# Patient Record
Sex: Male | Born: 1960 | Race: White | Hispanic: No | Marital: Married | Smoking: Never smoker
Health system: Southern US, Community
[De-identification: ages and names within clinical notes are randomized; demographics above are authoritative.]

## PROBLEM LIST (undated history)

## (undated) DIAGNOSIS — M722 Plantar fascial fibromatosis: Secondary | ICD-10-CM

## (undated) DIAGNOSIS — Z87442 Personal history of urinary calculi: Secondary | ICD-10-CM

## (undated) DIAGNOSIS — C801 Malignant (primary) neoplasm, unspecified: Secondary | ICD-10-CM

## (undated) DIAGNOSIS — Z98811 Dental restoration status: Secondary | ICD-10-CM

## (undated) HISTORY — PX: BREAST ENHANCEMENT SURGERY: SHX7

## (undated) HISTORY — PX: MANDIBLE SURGERY: SHX707

## (undated) HISTORY — PX: KNEE ARTHROSCOPY: SHX127

## (undated) HISTORY — PX: LITHOTRIPSY: SUR834

---

## 2004-11-01 ENCOUNTER — Ambulatory Visit: Payer: Self-pay | Admitting: Licensed Clinical Social Worker

## 2004-11-05 ENCOUNTER — Ambulatory Visit: Payer: Self-pay | Admitting: Licensed Clinical Social Worker

## 2004-11-11 ENCOUNTER — Ambulatory Visit: Payer: Self-pay | Admitting: Licensed Clinical Social Worker

## 2004-11-16 ENCOUNTER — Ambulatory Visit: Payer: Self-pay | Admitting: Licensed Clinical Social Worker

## 2004-11-16 ENCOUNTER — Ambulatory Visit: Payer: Self-pay | Admitting: Family Medicine

## 2004-11-24 ENCOUNTER — Ambulatory Visit: Payer: Self-pay | Admitting: Licensed Clinical Social Worker

## 2004-12-02 ENCOUNTER — Ambulatory Visit: Payer: Self-pay | Admitting: Licensed Clinical Social Worker

## 2004-12-22 ENCOUNTER — Ambulatory Visit (HOSPITAL_BASED_OUTPATIENT_CLINIC_OR_DEPARTMENT_OTHER): Admission: RE | Admit: 2004-12-22 | Discharge: 2004-12-22 | Payer: Self-pay | Admitting: Orthopedic Surgery

## 2004-12-22 HISTORY — PX: CLOSED REDUCTION METACARPAL WITH PERCUTANEOUS PINNING: SHX5613

## 2005-02-15 ENCOUNTER — Encounter: Admission: RE | Admit: 2005-02-15 | Discharge: 2005-02-15 | Payer: Self-pay | Admitting: Family Medicine

## 2005-02-15 ENCOUNTER — Ambulatory Visit: Payer: Self-pay | Admitting: Family Medicine

## 2005-02-17 ENCOUNTER — Ambulatory Visit: Payer: Self-pay | Admitting: Family Medicine

## 2005-02-23 ENCOUNTER — Emergency Department (HOSPITAL_COMMUNITY): Admission: EM | Admit: 2005-02-23 | Discharge: 2005-02-23 | Payer: Self-pay | Admitting: Emergency Medicine

## 2005-03-03 ENCOUNTER — Emergency Department (HOSPITAL_COMMUNITY): Admission: EM | Admit: 2005-03-03 | Discharge: 2005-03-03 | Payer: Self-pay | Admitting: Emergency Medicine

## 2005-03-03 ENCOUNTER — Ambulatory Visit (HOSPITAL_COMMUNITY): Admission: RE | Admit: 2005-03-03 | Discharge: 2005-03-03 | Payer: Self-pay | Admitting: Urology

## 2005-10-07 ENCOUNTER — Ambulatory Visit: Payer: Self-pay | Admitting: Family Medicine

## 2005-10-12 ENCOUNTER — Ambulatory Visit: Payer: Self-pay | Admitting: Family Medicine

## 2007-03-23 ENCOUNTER — Ambulatory Visit: Payer: Self-pay | Admitting: Family Medicine

## 2007-03-23 LAB — CONVERTED CEMR LAB
ALT: 16 units/L (ref 0–53)
AST: 21 units/L (ref 0–37)
Albumin: 4 g/dL (ref 3.5–5.2)
Alkaline Phosphatase: 57 units/L (ref 39–117)
BUN: 15 mg/dL (ref 6–23)
Basophils Absolute: 0 10*3/uL (ref 0.0–0.1)
CO2: 26 meq/L (ref 19–32)
Calcium: 9.3 mg/dL (ref 8.4–10.5)
Chloride: 106 meq/L (ref 96–112)
Creatinine, Ser: 1.1 mg/dL (ref 0.4–1.5)
Eosinophils Absolute: 0.2 10*3/uL (ref 0.0–0.6)
GFR calc Af Amer: 92 mL/min
Glucose, Bld: 85 mg/dL (ref 70–99)
HCT: 44.2 % (ref 39.0–52.0)
HDL: 54.8 mg/dL (ref >39.0)
Monocytes Absolute: 0.5 10*3/uL (ref 0.2–0.7)
Neutro Abs: 2.7 10*3/uL (ref 1.4–7.7)
Potassium: 3.9 meq/L (ref 3.5–5.1)
Total CHOL/HDL Ratio: 3.3
Total Protein: 6.9 g/dL (ref 6.0–8.3)

## 2007-03-30 ENCOUNTER — Ambulatory Visit: Payer: Self-pay | Admitting: Family Medicine

## 2007-09-27 DIAGNOSIS — M7520 Bicipital tendinitis, unspecified shoulder: Secondary | ICD-10-CM | POA: Insufficient documentation

## 2007-10-11 ENCOUNTER — Ambulatory Visit: Payer: Self-pay | Admitting: Family Medicine

## 2008-03-19 ENCOUNTER — Ambulatory Visit: Payer: Self-pay | Admitting: Family Medicine

## 2008-03-19 LAB — CONVERTED CEMR LAB
ALT: 17 units/L (ref 0–53)
AST: 17 units/L (ref 0–37)
Alkaline Phosphatase: 53 units/L (ref 39–117)
BUN: 19 mg/dL (ref 6–23)
Chloride: 108 meq/L (ref 96–112)
Cholesterol: 174 mg/dL (ref 0–200)
Creatinine, Ser: 1 mg/dL (ref 0.4–1.5)
Eosinophils Relative: 4.2 % (ref 0.0–5.0)
GFR calc non Af Amer: 85 mL/min
HCT: 44.6 % (ref 39.0–52.0)
Lymphocytes Relative: 39 % (ref 12.0–46.0)
MCHC: 34.9 g/dL (ref 30.0–36.0)
MCV: 89.3 fL (ref 78.0–100.0)
Monocytes Relative: 9 % (ref 3.0–12.0)
Neutrophils Relative %: 46.3 % (ref 43.0–77.0)
Nitrite: NEGATIVE
Platelets: 241 10*3/uL (ref 150–400)
Potassium: 3.9 meq/L (ref 3.5–5.1)
RDW: 12.2 % (ref 11.5–14.6)
Sodium: 139 meq/L (ref 135–145)
Total Bilirubin: 1 mg/dL (ref 0.3–1.2)
Total Protein: 6.7 g/dL (ref 6.0–8.3)
Triglycerides: 61 mg/dL (ref 0–149)
VLDL: 12 mg/dL (ref 0–40)
WBC Urine, dipstick: NEGATIVE

## 2008-03-24 ENCOUNTER — Ambulatory Visit: Payer: Self-pay | Admitting: Family Medicine

## 2008-03-24 DIAGNOSIS — Z87448 Personal history of other diseases of urinary system: Secondary | ICD-10-CM | POA: Insufficient documentation

## 2008-04-24 ENCOUNTER — Ambulatory Visit: Payer: Self-pay | Admitting: Family Medicine

## 2008-04-24 LAB — CONVERTED CEMR LAB
Glucose, Urine, Semiquant: NEGATIVE
Ketones, urine, test strip: NEGATIVE
Nitrite: NEGATIVE
Urobilinogen, UA: 0.2
WBC Urine, dipstick: NEGATIVE

## 2008-06-02 ENCOUNTER — Ambulatory Visit (HOSPITAL_COMMUNITY): Admission: RE | Admit: 2008-06-02 | Discharge: 2008-06-02 | Payer: Self-pay | Admitting: Urology

## 2010-10-10 LAB — CONVERTED CEMR LAB: Urobilinogen, UA: 0.2

## 2010-12-15 ENCOUNTER — Other Ambulatory Visit (INDEPENDENT_AMBULATORY_CARE_PROVIDER_SITE_OTHER): Payer: Federal, State, Local not specified - PPO | Admitting: Family Medicine

## 2010-12-15 DIAGNOSIS — Z0000 Encounter for general adult medical examination without abnormal findings: Secondary | ICD-10-CM

## 2010-12-15 LAB — CBC WITH DIFFERENTIAL/PLATELET
Basophils Relative: 1.3 % (ref 0.0–3.0)
Eosinophils Absolute: 0.2 10*3/uL (ref 0.0–0.7)
Eosinophils Relative: 3.4 % (ref 0.0–5.0)
Lymphocytes Relative: 40.2 % (ref 12.0–46.0)
Lymphs Abs: 2.1 10*3/uL (ref 0.7–4.0)
MCHC: 34.8 g/dL (ref 30.0–36.0)
MCV: 88.7 fl (ref 78.0–100.0)
Monocytes Absolute: 0.5 10*3/uL (ref 0.1–1.0)
Monocytes Relative: 8.9 % (ref 3.0–12.0)
Neutrophils Relative %: 46.2 % (ref 43.0–77.0)
RBC: 5 Mil/uL (ref 4.22–5.81)

## 2010-12-15 LAB — BASIC METABOLIC PANEL
BUN: 22 mg/dL (ref 6–23)
Chloride: 108 mEq/L (ref 96–112)
Creatinine, Ser: 1.3 mg/dL (ref 0.4–1.5)
GFR: 61.79 mL/min (ref >60.00)
Glucose, Bld: 85 mg/dL (ref 70–99)
Potassium: 4.2 mEq/L (ref 3.5–5.1)

## 2010-12-15 LAB — POCT URINALYSIS DIPSTICK
Bilirubin, UA: NEGATIVE
Glucose, UA: NEGATIVE
Leukocytes, UA: NEGATIVE
pH, UA: 5

## 2010-12-15 LAB — TSH: TSH: 1.12 u[IU]/mL (ref 0.35–5.50)

## 2010-12-15 LAB — LIPID PANEL
HDL: 59 mg/dL (ref >39.00)
LDL Cholesterol: 118 mg/dL — ABNORMAL HIGH (ref 0–99)
Total CHOL/HDL Ratio: 3
Triglycerides: 54 mg/dL (ref 0.0–149.0)
VLDL: 10.8 mg/dL (ref 0.0–40.0)

## 2010-12-15 LAB — HEPATIC FUNCTION PANEL
Albumin: 4 g/dL (ref 3.5–5.2)
Alkaline Phosphatase: 61 U/L (ref 39–117)

## 2010-12-16 ENCOUNTER — Encounter: Payer: Self-pay | Admitting: Family Medicine

## 2010-12-20 ENCOUNTER — Encounter: Payer: Self-pay | Admitting: Family Medicine

## 2010-12-20 ENCOUNTER — Ambulatory Visit (INDEPENDENT_AMBULATORY_CARE_PROVIDER_SITE_OTHER): Payer: Federal, State, Local not specified - PPO | Admitting: Family Medicine

## 2010-12-20 VITALS — BP 120/80 | Temp 98.5°F | Ht 72.0 in | Wt 191.0 lb

## 2010-12-20 DIAGNOSIS — Z87448 Personal history of other diseases of urinary system: Secondary | ICD-10-CM

## 2010-12-20 DIAGNOSIS — Z136 Encounter for screening for cardiovascular disorders: Secondary | ICD-10-CM

## 2010-12-20 DIAGNOSIS — Z0000 Encounter for general adult medical examination without abnormal findings: Secondary | ICD-10-CM

## 2010-12-20 NOTE — Patient Instructions (Signed)
Continued good health habits.  Remember to wear your sunscreens daily.  Colonoscopy in GI.  Soak and finally her toenails weekly.  Return in one year or sooner if any problems

## 2010-12-20 NOTE — Progress Notes (Signed)
  Subjective:    Patient ID: Adrian [name], male    DOB: 06-Jul-1961, 50 y.o.   MRN: [id number]  HPI Mehul is a 50 year old, married man nonsmoker comes in today for general physical examination  Is always been in excellent health.  He said no chronic health problems.  At one time he had hematuria, but has since resolved.  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  He works outdoors in the sun and would like to discuss his skin.  He takes no medication on a regular basis.  Status post a 2008   Review of Systems  Constitutional: Negative.   HENT: Negative.   Eyes: Negative.   Respiratory: Negative.   Cardiovascular: Negative.   Gastrointestinal: Negative.   Genitourinary: Negative.   Musculoskeletal: Negative.   Skin: Negative.   Neurological: Negative.   Hematological: Negative.   Psychiatric/Behavioral: Negative.        Objective:   Physical Exam  Constitutional: He is oriented to person, place, and time. He appears well-developed and well-nourished.  HENT:  Head: Normocephalic and atraumatic.  Right Ear: External ear normal.  Left Ear: External ear normal.  Nose: Nose normal.  Mouth/Throat: Oropharynx is clear and moist.  Eyes: Conjunctivae and EOM are normal. Pupils are equal, round, and reactive to light.  Neck: Normal range of motion. Neck supple. No JVD present. No tracheal deviation present. No thyromegaly present.  Cardiovascular: Normal rate, regular rhythm, normal heart sounds and intact distal pulses.  Exam reveals no gallop and no friction rub.   No murmur heard. Pulmonary/Chest: Effort normal and breath sounds normal. No stridor. No respiratory distress. He has no wheezes. He has no rales. He exhibits no tenderness.  Abdominal: Soft. Bowel sounds are normal. He exhibits no distension and no mass. There is no tenderness. There is no rebound and no guarding.  Genitourinary: Rectum normal, prostate normal and  penis normal. Guaiac negative stool. No penile tenderness.  Musculoskeletal: Normal range of motion. He exhibits no edema and no tenderness.  Lymphadenopathy:    He has no cervical adenopathy.  Neurological: He is alert and oriented to person, place, and time. He has normal reflexes. No cranial nerve deficit. He exhibits normal muscle tone.  Skin: Skin is warm and dry. No rash noted. No erythema. No pallor.  Psychiatric: He has a normal mood and affect. His behavior is normal. Judgment and thought content normal.          Assessment & Plan:  Healthy male.  History of hematuria, resolved.  Fungal infection in his toenails,,,,,,,,, so can file weekly until gone.  Screening colonoscopy

## 2011-01-11 ENCOUNTER — Ambulatory Visit (AMBULATORY_SURGERY_CENTER): Payer: Federal, State, Local not specified - PPO

## 2011-01-11 VITALS — Ht 71.0 in | Wt 186.7 lb

## 2011-01-11 DIAGNOSIS — Z1211 Encounter for screening for malignant neoplasm of colon: Secondary | ICD-10-CM

## 2011-01-11 MED ORDER — PEG-KCL-NACL-NASULF-NA ASC-C 100 G PO SOLR: 1.0000 | Freq: Once | ORAL | Status: AC

## 2011-01-24 ENCOUNTER — Encounter: Payer: Self-pay | Admitting: Gastroenterology

## 2011-01-25 ENCOUNTER — Ambulatory Visit (AMBULATORY_SURGERY_CENTER): Payer: Federal, State, Local not specified - PPO | Admitting: Gastroenterology

## 2011-01-25 ENCOUNTER — Encounter: Payer: Self-pay | Admitting: Gastroenterology

## 2011-01-25 VITALS — BP 132/65 | HR 56 | Temp 97.7°F | Resp 18 | Ht 72.0 in | Wt 190.0 lb

## 2011-01-25 DIAGNOSIS — Z1211 Encounter for screening for malignant neoplasm of colon: Secondary | ICD-10-CM

## 2011-01-25 DIAGNOSIS — K5730 Diverticulosis of large intestine without perforation or abscess without bleeding: Secondary | ICD-10-CM

## 2011-01-25 MED ORDER — SODIUM CHLORIDE 0.9 % IV SOLN: 500.0000 mL | INTRAVENOUS | Status: DC

## 2011-01-25 NOTE — Patient Instructions (Signed)
Mild diverticulosis, otherwise normal.  See blue and green sheets for additional d/c instructions.  Recall colonoscopy in 10 years.

## 2011-01-26 ENCOUNTER — Telehealth: Payer: Self-pay | Admitting: *Deleted

## 2011-01-26 NOTE — Telephone Encounter (Signed)

## 2011-01-28 NOTE — Op Note (Signed)
NAME:  Adrian [name], Adrian [name]                   ACCOUNT NO.:  [id number]   MEDICAL RECORD NO.:  [id number]          PATIENT TYPE:  AMB   LOCATION:  DSC                          FACILITY:  [facility]   PHYSICIAN:  Loreta Ave, M.D. DATE OF BIRTH:  19-Jun-1961   DATE OF PROCEDURE:  12/22/2004  DATE OF DISCHARGE:                                 OPERATIVE REPORT   PREOPERATIVE DIAGNOSES:  Markedly displaced distal shaft fifth metacarpal  fracture, closed.   POSTOPERATIVE DIAGNOSES:  Markedly displaced distal shaft fifth metacarpal  fracture, closed.   OPERATION PERFORMED:  Closed reduction and percutaneous pinning with 0.045 K-  wires times two, fifth metacarpal right hand.   SURGEON:  Loreta Ave, M.D.   ASSISTANT:  Genene Churn. Denton Meek.   ANESTHESIA:  General.   BLOOD LOSS:  Minimal.   SPECIMENS:  None.   CULTURES:  None.   COMPLICATIONS:  None.   DRESSING:  Soft compressive with ulnar gutter splint.   DESCRIPTION OF PROCEDURE:  The patient was brought to the operating room and  after adequate anesthesia had been obtained, prepped and draped in the usual  sterile fashion.  The small C-arm used for fluoroscopic guidance.  The fifth  metacarpal head was comminuted and displaced  into marked volar tilt and  also translated over towards the fourth metacarpal.  It could be manipulated  and reduced to very acceptable position.  Comminution and very distal  fracture.  I therefore elected to pin it from distal to proximal.  Holding  it in reduced manner, two K-wires were passed into the head, across the  fracture and down into the shaft.  These exited out dorsally, slightly ulnar  and radial to the base of the proximal phalanx.  At completion, overall  alignment was excellent in all views.  K-wires were bent  outside the skin, capped and cut off.  He had normal rotation, full  extension and no angulation at completion.  Dressing was placed where the K-  wires entered the skin.  Bulky hand  dressing and ulnar gutter splint  applied.  Anesthesia reversed.  Brought to recovery room.  Tolerated surgery  well without complication.      DFM/MEDQ  D:  12/22/2004  T:  12/22/2004  Job:  [id number]

## 2011-06-13 LAB — BASIC METABOLIC PANEL
BUN: 13
GFR calc Af Amer: >60
Glucose, Bld: 102 — ABNORMAL HIGH
Potassium: 4.1
Sodium: 140

## 2011-06-13 LAB — CBC
HCT: 45.8
Hemoglobin: 15.8
MCV: 88.5
Platelets: 256
RBC: 5.18
RDW: 12.2

## 2012-06-01 ENCOUNTER — Other Ambulatory Visit: Payer: Self-pay | Admitting: Sports Medicine

## 2012-06-01 DIAGNOSIS — M79671 Pain in right foot: Secondary | ICD-10-CM

## 2012-06-05 ENCOUNTER — Ambulatory Visit
Admission: RE | Admit: 2012-06-05 | Discharge: 2012-06-05 | Disposition: A | Discharge status: Home / Self Care | Payer: Federal, State, Local not specified - PPO | Source: Ambulatory Visit | Attending: Sports Medicine | Admitting: Sports Medicine

## 2012-06-05 DIAGNOSIS — M79671 Pain in right foot: Secondary | ICD-10-CM

## 2012-06-22 ENCOUNTER — Encounter (HOSPITAL_BASED_OUTPATIENT_CLINIC_OR_DEPARTMENT_OTHER): Payer: Self-pay | Admitting: *Deleted

## 2012-06-27 NOTE — H&P (Signed)
Adrian [name]/[facility] [address]   [address] [place], [place] [address] [phone] A Division of [facility]  Adrian [name], M.D.   Adrian [name], M.D.   Adrian [name], M.D.   Adrian [name], M.D.   Adrian [name], M.D Adrian [name], M.D.  Adrian [name], M.D.   Adrian [name], M.D.   Melina Fiddler, M.D. Adrian [name]. Adrian Dienes, PA-C            Adrian A. Shepperson, PA-C Adrian Perrin, PA-C [place], [place]  RE: Adrian [name], Adrian [name]   [id number]      DOB: Jan 14, 1961 PROGRESS NOTE: 06-01-12 Follow-up foot pain.   SUBJECTIVE:   Adrian [name] is here today for continued problems with his right foot. He is a Environmental manager. We have been treating him for plantar fasciitis. He says his pain is more posterior than the plantar fascia. We have done an injection, orthotics, stretches, and physical therapy. 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 He reports the pain as a 6/10 when he takes his first step as well as at the end of the day. It is worse with more activity; a little bit better with rest as long as he is not weightbearing.   OBJECTIVE:   On exam he is alert and oriented x3. Respiratory rate is 12. His right foot is examined. He has tenderness to squeeze test in the calcaneus. He is not tender today over the insertion of the plantar fascia. He does have a little bit of discomfort with dorsiflexion. No pain with plantar flexion, inversion or eversion.   IMPRESSION:   Right heel pain. Plantar fasciitis versus stress fracture.   PLAN:   I am going to set him up for an MRI scan. He has failed conservative management. He is sore more with a squeeze test and palpation of his plantar fascia. He is in agreement with that. We will see him back after the MRI scan.      Melina Fiddler, M.D.  Electronically verified by Melina Fiddler, M.D. RSB:tal D 06-01-12 T  06-04-12  Jenny Lai/[facility] [address]   [address] [place], [place] [address] [phone] A Division of [facility]  Adrian [name], M.D.   Adrian [name], M.D.   Adrian [name], M.D.   Adrian [name], M.D.   Adrian [name], M.D Adrian [name], M.D.  Adrian [name], M.D.   Adrian [name], M.D.   Melina Fiddler, M.D. Adrian [name]. Adrian Dienes, PA-C            Adrian A. Shepperson, PA-C Adrian Beason, PA-C [place], [place]   RE: Adrian [name], Adrian [name]                                [id number]      DOB: [birth year] PROGRESS NOTE: 06-12-12 [age] year-old white male with a history of right heel pain.  Returns for review of his MRI scan performed on June 05, 2012.  Scan showed marked plantar fasciitis involving the medial band, prominent edema in the inferior medial aspect of the calcaneus at the origin of the medial band.  Medial band is thickened and degenerated.  Edema in the adjacent soft tissue and in the underlying muscles.  Calcaneus is normal.  No stress reaction  or stress fracture.  As previously documented, he has failed conservative treatment with stretching program and injections.  Patient has been employed with the [employer] for ten years and walks at least 10-15 miles per day, 5 days per week.   EXAMINATION: Pleasant white male, alert and oriented x 3 and in no acute distress.  Gait is antalgic.  Right ankle he has good range of motion.  Ligaments stable.  Right foot he is exquisitely tender over the plantar fascia, mostly over the medial band, as addressed in the MRI scan.  No swelling or bruising.  Neurovascularly intact.  Skin warm and dry.  No increase in respiratory effort.    IMPRESSION: Right heel pain secondary to plantar fasciitis.   PLAN: Advised that the best treatment option at this point would be endoscopic plantar fascial release.  Surgical procedure, along with potential rehab/recovery  time discussed.  Anticipate out of work at least 4-6 weeks.  Do not feel further treatment with injections or rehab would be of any great benefit to him at this time.  We feel that his current job may have contributed to his current foot problem.  Pre-op paperwork filled out.    Adrian [name], M.D.   Electronically verified by Adrian [name], M.D. DFM(JMO):jjh D 06-13-12 T 06-13-12

## 2012-06-28 ENCOUNTER — Ambulatory Visit (HOSPITAL_BASED_OUTPATIENT_CLINIC_OR_DEPARTMENT_OTHER)
Admission: RE | Admit: 2012-06-28 | Discharge: 2012-06-28 | Disposition: A | Discharge status: Home / Self Care | Payer: Federal, State, Local not specified - PPO | Source: Ambulatory Visit | Attending: Orthopedic Surgery | Admitting: Orthopedic Surgery

## 2012-06-28 ENCOUNTER — Encounter (HOSPITAL_BASED_OUTPATIENT_CLINIC_OR_DEPARTMENT_OTHER): Payer: Self-pay | Admitting: Certified Registered"

## 2012-06-28 ENCOUNTER — Encounter (HOSPITAL_BASED_OUTPATIENT_CLINIC_OR_DEPARTMENT_OTHER): Payer: Self-pay

## 2012-06-28 ENCOUNTER — Encounter (HOSPITAL_BASED_OUTPATIENT_CLINIC_OR_DEPARTMENT_OTHER)
Admission: RE | Disposition: A | Discharge status: Home / Self Care | Payer: Self-pay | Source: Ambulatory Visit | Attending: Orthopedic Surgery

## 2012-06-28 ENCOUNTER — Ambulatory Visit (HOSPITAL_BASED_OUTPATIENT_CLINIC_OR_DEPARTMENT_OTHER): Payer: Federal, State, Local not specified - PPO | Admitting: Certified Registered"

## 2012-06-28 DIAGNOSIS — M722 Plantar fascial fibromatosis: Secondary | ICD-10-CM | POA: Insufficient documentation

## 2012-06-28 HISTORY — PX: PLANTAR FASCIA RELEASE: SHX2239

## 2012-06-28 LAB — POCT HEMOGLOBIN-HEMACUE: Hemoglobin: 15.6 g/dL (ref 13.0–17.0)

## 2012-06-28 SURGERY — FASCIOTOMY, PLANTAR, ENDOSCOPIC
Anesthesia: General | Site: Foot | Laterality: Right | Wound class: Clean

## 2012-06-28 MED ORDER — BUPIVACAINE HCL (PF) 0.5 % IJ SOLN
INTRAMUSCULAR | Status: DC | PRN
  Administered 2012-06-28: 10 mL

## 2012-06-28 MED ORDER — PROPOFOL 10 MG/ML IV BOLUS
INTRAVENOUS | Status: DC | PRN
  Administered 2012-06-28: 200 mg via INTRAVENOUS

## 2012-06-28 MED ORDER — CEFAZOLIN SODIUM-DEXTROSE 2-3 GM-% IV SOLR
2.0000 g | INTRAVENOUS | Status: AC
  Administered 2012-06-28: 2 g via INTRAVENOUS

## 2012-06-28 MED ORDER — HYDROMORPHONE HCL PF 1 MG/ML IJ SOLN: 0.2500 mg | INTRAMUSCULAR | Status: DC | PRN

## 2012-06-28 MED ORDER — FENTANYL CITRATE 0.05 MG/ML IJ SOLN
INTRAMUSCULAR | Status: DC | PRN
  Administered 2012-06-28: 100 ug via INTRAVENOUS

## 2012-06-28 MED ORDER — MIDAZOLAM HCL 5 MG/5ML IJ SOLN
INTRAMUSCULAR | Status: DC | PRN
  Administered 2012-06-28: 2 mg via INTRAVENOUS

## 2012-06-28 MED ORDER — DEXAMETHASONE SODIUM PHOSPHATE 4 MG/ML IJ SOLN
INTRAMUSCULAR | Status: DC | PRN
  Administered 2012-06-28: 10 mg via INTRAVENOUS

## 2012-06-28 MED ORDER — DROPERIDOL 2.5 MG/ML IJ SOLN: 0.6250 mg | INTRAMUSCULAR | Status: DC | PRN

## 2012-06-28 MED ORDER — LIDOCAINE HCL (CARDIAC) 20 MG/ML IV SOLN
INTRAVENOUS | Status: DC | PRN
  Administered 2012-06-28: 80 mg via INTRAVENOUS

## 2012-06-28 MED ORDER — MEPERIDINE HCL 50 MG PO TABS: 50.0000 mg | ORAL_TABLET | Freq: Four times a day (QID) | ORAL | Status: DC | PRN

## 2012-06-28 MED ORDER — LACTATED RINGERS IV SOLN
INTRAVENOUS | Status: DC
  Administered 2012-06-28: 11:00:00 via INTRAVENOUS

## 2012-06-28 MED ORDER — ONDANSETRON HCL 4 MG/2ML IJ SOLN
INTRAMUSCULAR | Status: DC | PRN
  Administered 2012-06-28: 4 mg via INTRAVENOUS

## 2012-06-28 SURGICAL SUPPLY — 48 items
APPLICATOR COTTON TIP 6IN STRL (MISCELLANEOUS) ×4 IMPLANT
BANDAGE ELASTIC 3 VELCRO ST LF (GAUZE/BANDAGES/DRESSINGS) ×2 IMPLANT
BANDAGE ELASTIC 4 VELCRO ST LF (GAUZE/BANDAGES/DRESSINGS) ×2 IMPLANT
BANDAGE ESMARK 6X9 LF (GAUZE/BANDAGES/DRESSINGS) ×1 IMPLANT
BLADE CUTTER GATOR 3.5 (BLADE) ×2 IMPLANT
BLADE GREAT WHITE 4.2 (BLADE) IMPLANT
BLADE SURG 15 STRL LF DISP TIS (BLADE) ×1 IMPLANT
BLADE SURG 15 STRL SS (BLADE) ×1
BLADE TRIANGLE EPF/EGR ENDO (BLADE) ×2 IMPLANT
BNDG COHESIVE 4X5 TAN STRL (GAUZE/BANDAGES/DRESSINGS) ×2 IMPLANT
BNDG ESMARK 6X9 LF (GAUZE/BANDAGES/DRESSINGS) ×2
BUR CUDA 2.9 (BURR) IMPLANT
BUR GATOR 2.9 (BURR) IMPLANT
CANISTER SUCTION 1200CC (MISCELLANEOUS) ×2 IMPLANT
CLOTH BEACON ORANGE TIMEOUT ST (SAFETY) ×2 IMPLANT
COVER TABLE BACK 60X90 (DRAPES) ×2 IMPLANT
CUFF TOURNIQUET SINGLE 18IN (TOURNIQUET CUFF) ×2 IMPLANT
CUFF TOURNIQUET SINGLE 34IN LL (TOURNIQUET CUFF) IMPLANT
DECANTER SPIKE VIAL GLASS SM (MISCELLANEOUS) IMPLANT
DRAPE EXTREMITY T 121X128X90 (DRAPE) ×2 IMPLANT
DRAPE OEC MINIVIEW 54X84 (DRAPES) ×2 IMPLANT
DRAPE U 20/CS (DRAPES) ×2 IMPLANT
DRAPE U-SHAPE 47X51 STRL (DRAPES) ×2 IMPLANT
DURAPREP 26ML APPLICATOR (WOUND CARE) ×2 IMPLANT
GAUZE XEROFORM 1X8 LF (GAUZE/BANDAGES/DRESSINGS) ×2 IMPLANT
GLOVE BIO SURGEON STRL SZ7 (GLOVE) ×2 IMPLANT
GLOVE BIOGEL PI IND STRL 8 (GLOVE) ×1 IMPLANT
GLOVE BIOGEL PI INDICATOR 8 (GLOVE) ×1
GLOVE INDICATOR 7.0 STRL GRN (GLOVE) ×2 IMPLANT
GLOVE ORTHO TXT STRL SZ7.5 (GLOVE) ×4 IMPLANT
GOWN BRE IMP PREV XXLGXLNG (GOWN DISPOSABLE) ×2 IMPLANT
GOWN PREVENTION PLUS XLARGE (GOWN DISPOSABLE) ×4 IMPLANT
NEEDLE HYPO 22GX1.5 SAFETY (NEEDLE) IMPLANT
NS IRRIG 1000ML POUR BTL (IV SOLUTION) IMPLANT
PACK BASIN DAY SURGERY FS (CUSTOM PROCEDURE TRAY) ×2 IMPLANT
PAD CAST 3X4 CTTN HI CHSV (CAST SUPPLIES) ×1 IMPLANT
PADDING CAST ABS 3INX4YD NS (CAST SUPPLIES) ×1
PADDING CAST ABS COTTON 3X4 (CAST SUPPLIES) ×1 IMPLANT
PADDING CAST COTTON 3X4 STRL (CAST SUPPLIES) ×1
SPONGE GAUZE 4X4 12PLY (GAUZE/BANDAGES/DRESSINGS) ×2 IMPLANT
STOCKINETTE 6  STRL (DRAPES) ×1
STOCKINETTE 6 STRL (DRAPES) ×1 IMPLANT
SUT ETHILON 3 0 PS 1 (SUTURE) ×2 IMPLANT
SYR BULB 3OZ (MISCELLANEOUS) ×2 IMPLANT
SYR CONTROL 10ML LL (SYRINGE) IMPLANT
TUBE CONNECTING 20X1/4 (TUBING) ×2 IMPLANT
UNDERPAD 30X30 INCONTINENT (UNDERPADS AND DIAPERS) ×2 IMPLANT
WATER STERILE IRR 1000ML POUR (IV SOLUTION) ×2 IMPLANT

## 2012-06-28 NOTE — Brief Op Note (Signed)
06/28/2012  11:56 AM  PATIENT:  Adrian [name]  51 y.o. male  PRE-OPERATIVE DIAGNOSIS:  RIGHT PLANTAR FASCIITIS HEEL  POST-OPERATIVE DIAGNOSIS:  RIGHT PLANTAR FASCIITIS HEEL  PROCEDURE:  Procedure(s) (LRB) with comments: ENDOSCOPIC PLANTAR FASCIOTOMY (Right)  SURGEON:  Surgeon(s) and Role:    * Loreta Ave, MD - Primary  PHYSICIAN ASSISTANT: Zonia Kief M     ANESTHESIA:   general  EBL:      COUNTS:  YES  TOURNIQUET:   Total Tourniquet Time Documented: Calf (Right) - 37 minutes  PATIENT DISPOSITION:  PACU - hemodynamically stable.

## 2012-06-28 NOTE — Transfer of Care (Signed)
Immediate Anesthesia Transfer of Care Note  Patient: Adrian [name]  Procedure(s) Performed: Procedure(s) (LRB) with comments: ENDOSCOPIC PLANTAR FASCIOTOMY (Right)  Patient Location: PACU  Anesthesia Type: General  Level of Consciousness: awake  Airway & Oxygen Therapy: Patient Spontanous Breathing and Patient connected to face mask oxygen  Post-op Assessment: Report given to PACU RN and Post -op Vital signs reviewed and stable  Post vital signs: Reviewed and stable  Complications: No apparent anesthesia complications

## 2012-06-28 NOTE — Anesthesia Postprocedure Evaluation (Signed)
Anesthesia Post Note  Patient: Adrian [name]  Procedure(s) Performed: Procedure(s) (LRB): ENDOSCOPIC PLANTAR FASCIOTOMY (Right)  Anesthesia type: general  Patient location: PACU  Post pain: Pain level controlled  Post assessment: Patient's Cardiovascular Status Stable  Last Vitals:  Filed Vitals:   06/28/12 1318  BP: 117/85  Pulse: 52  Temp: 36.2 C  Resp: 16    Post vital signs: Reviewed and stable  Level of consciousness: sedated  Complications: No apparent anesthesia complications

## 2012-06-28 NOTE — Anesthesia Procedure Notes (Signed)
Procedure Name: LMA Insertion Performed by: Lance Coon Pre-anesthesia Checklist: Patient identified, Timeout performed, Emergency Drugs available, Suction available and Patient being monitored Patient Re-evaluated:Patient Re-evaluated prior to inductionOxygen Delivery Method: Circle system utilized Preoxygenation: Pre-oxygenation with 100% oxygen Intubation Type: IV induction Ventilation: Mask ventilation without difficulty LMA: LMA inserted LMA Size: 5.0 Number of attempts: 2 Placement Confirmation: breath sounds checked- equal and bilateral and positive ETCO2 Tube secured with: Tape Dental Injury: Teeth and Oropharynx as per pre-operative assessment

## 2012-06-28 NOTE — Anesthesia Preprocedure Evaluation (Signed)
Anesthesia Evaluation  Patient identified by MRN, date of birth, ID band Patient awake    Reviewed: Allergy & Precautions, H&P , NPO status , Patient's Chart, lab work & pertinent test results  History of Anesthesia Complications Negative for: history of anesthetic complications  Airway Mallampati: I TM Distance: >3 FB Neck ROM: Full    Dental  (+) Teeth Intact and Dental Advisory Given   Pulmonary neg pulmonary ROS,    Pulmonary exam normal       Cardiovascular negative cardio ROS      Neuro/Psych negative neurological ROS     GI/Hepatic negative GI ROS, Neg liver ROS,   Endo/Other  negative endocrine ROS  Renal/GU negative Renal ROS     Musculoskeletal   Abdominal   Peds  Hematology   Anesthesia Other Findings   Reproductive/Obstetrics                           Anesthesia Physical Anesthesia Plan  ASA: I  Anesthesia Plan: General   Post-op Pain Management:    Induction: Intravenous  Airway Management Planned: LMA  Additional Equipment:   Intra-op Plan:   Post-operative Plan: Extubation in OR  Informed Consent: I have reviewed the patients History and Physical, chart, labs and discussed the procedure including the risks, benefits and alternatives for the proposed anesthesia with the patient or authorized representative who has indicated his/her understanding and acceptance.   Dental advisory given  Plan Discussed with: CRNA, Anesthesiologist and Surgeon  Anesthesia Plan Comments:         Anesthesia Quick Evaluation

## 2012-06-28 NOTE — Interval H&P Note (Signed)
History and Physical Interval Note:  06/28/2012 7:33 AM  Adrian [name]  has presented today for surgery, with the diagnosis of RIGHT PLANTAR FASCIITIS HEEL  The various methods of treatment have been discussed with the patient and family. After consideration of risks, benefits and other options for treatment, the patient has consented to  Procedure(s) (LRB) with comments: ENDOSCOPIC PLANTAR FASCIOTOMY (Right) as a surgical intervention .  The patient's history has been reviewed, patient examined, no change in status, stable for surgery.  I have reviewed the patient's chart and labs.  Questions were answered to the patient's satisfaction.     Ileane Sando F

## 2012-06-29 ENCOUNTER — Encounter (HOSPITAL_BASED_OUTPATIENT_CLINIC_OR_DEPARTMENT_OTHER): Payer: Self-pay | Admitting: Orthopedic Surgery

## 2012-06-29 NOTE — Op Note (Signed)
[name], Adrian [name]                   ACCOUNT NO.:  [id number]  MEDICAL RECORD NO.:  [id number]  LOCATION:                                 FACILITY:  PHYSICIAN:  Loreta Ave, M.D. DATE OF BIRTH:  12-Jul-1961  DATE OF PROCEDURE:  06/28/2012 DATE OF DISCHARGE:                              OPERATIVE REPORT   PREOPERATIVE DIAGNOSIS:  Chronic recalcitrant plantar fasciitis, right heel.  POSTOPERATIVE DIAGNOSIS:  Chronic recalcitrant plantar fasciitis, right heel.  PROCEDURE:  Endoscopic plantar fascia release, right heel.  SURGEON:  Loreta Ave, M.D.  ASSISTANT:  Genene Churn. Barry Dienes, [place].  ANESTHESIA:  General.  BLOOD LOSS:  Minimal.  SPECIMENS:  None.  CULTURES:  None.  COMPLICATIONS:  None.  DRESSINGS:  Soft compressive.  TOURNIQUET TIME:  30 minutes.  PROCEDURE:  The patient was brought to the operating room, placed on the operating table in supine position.  After adequate anesthesia had been obtained, tourniquet applied.  Prepped and draped in usual sterile fashion.  Exsanguinated with elevation Esmarch.  Tourniquet inflated to 250 mmHg.  With fluoroscopic guidance, plantar fascia attached to the os calcis identified.  Small incision made on the medial and lateral aspect of the plantar surface.  Cannula passed from the plantar side of the plantar fascia from medial to lateral.  Under endoscopic direct visualization, plantar fascia was released of the attachment from medial to lateral.  Markedly thickened from chronic plantar fasciitis especially over the medial 2/3rds.  Nice complete release confirmed protecting neurovascular structures.  Wound irrigated.  Cannula removed.  I injected with Marcaine.  Portals were closed with nylon.  Sterile compressive dressing applied.  Anesthesia reversed.  Brought to recovery room.  Tolerated surgery well.  No complications.     Loreta Ave, M.D.     DFM/MEDQ  D:  06/28/2012  T:  06/29/2012  Job:  [id number]

## 2013-05-13 DIAGNOSIS — M722 Plantar fascial fibromatosis: Secondary | ICD-10-CM

## 2013-05-13 HISTORY — DX: Plantar fascial fibromatosis: M72.2

## 2013-06-06 ENCOUNTER — Encounter (HOSPITAL_BASED_OUTPATIENT_CLINIC_OR_DEPARTMENT_OTHER): Payer: Self-pay | Admitting: *Deleted

## 2013-06-09 ENCOUNTER — Other Ambulatory Visit: Payer: Self-pay | Admitting: Orthopedic Surgery

## 2013-06-12 NOTE — H&P (Signed)
Laiah Pouncey/[facility] [address]   [address] [place], [place] [address] [phone] A Division of [facility]  Adrian [name], M.D.   Robert A. Thurston Hole, M.D.   Burnell Blanks, M.D.   Adrian [name], M.D.   Lunette Stands, M.D Adrian [name]. Adrian [name], M.D.  Buford Dresser, M.D.  Charlsie Quest, M.D.  Estell Harpin, M.D.   Adrian [name], M.D. Kirstin A. Shepperson, PA-C  Adrian Rankin, PA-C [place], [place]  RE: Adrian [name], Adrian [name]   [id number]      DOB: Jul 22, 1961 PROGRESS NOTE: 04-24-13 Chief complaint: Left heel pain.  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 He had history of chronic plantar fasciitis in his right foot which required surgery, he says since surgery his right foot has done well he just has some pain along the metatarsal region. He is here today for left foot pain. He says it has been hurting in his heel since about a month ago, no increased activity or history of injury. He says pain is worse with standing long periods and also bothers him first thing in the morning and when he gets out of his truck to do his route. 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 He has no significant swelling. He has been taking occasional anti-inflammatories.  Past medical history is reviewed and updated and is unchanged. Social history: as listed above.  Review of systems is negative for numbness or tingling  OBJECTIVE: Alert and oriented x3. Respirations are 12. He is athletic appearing not overweight. Evaluation of his right foot shows a very cavus foot he has mild tenderness along the metatarsal heads, he has full range of motion of the foot and ankle without pain. Evaluation of the left foot reveals tenderness at the insertion of the plantar fascia. He has no tenderness throughout the arch. He has a very cavus foot he has mild pain with dorsiflexion of the ankle in the heel region. No pain with  plantarflexion he has full range of motion otherwise. He is neurovascularly intact distally bilaterally.  X-RAYS: Left heel shows inferior calcaeal spur he also has a spur along the calcaneus in the longitudinal arch he also has a posterior calcaneal spur.  IMPRESSION: Plantar fasciitis of the left foot subacute.  PLAN: We will try a cortisone injection today with 1:1 Depo-Medrol/Licocaine. We will do aggressive stretching exercises and heel cups. He has an insert for his right foot and I suggested getting over the counter insert from [employer] to give him better arch support. Follow-up with Dr. Eulah [name] if not improved.   Continued   Adrian [name]/[facility] [address]   [address] [place], [place] [address] [phone] A Division of [facility]  Adrian [name], M.D.   Robert A. Thurston Hole, M.D.   Burnell Blanks, M.D.   Adrian [name], M.D.   Lunette Stands, M.D Adrian [name]. Adrian [name], M.D.  Buford Dresser, M.D.  Charlsie Quest, M.D.  Estell Harpin, M.D.   Adrian [name], M.D. Kirstin A. Shepperson, PA-C  Adrian [place], PA-C [place], [place]  RE: Adrian [name], Adrian [name]   [id number]      DOB: 1961-09-04 PROGRESS NOTE: 04-24-13 PROCEDURE: The patient's clinical condition is marked by substantial pain and/or significant functional disability. Other conservative therapy has not provided relief, is contraindicated, or not appropriate. There is a reasonable likelihood that injection will significantly improve the  patient's pain and/or functional disability.   Patient is placed supine on exam table, the foot is prepped with Betadine and alcohol along the medial calcaneous and injected with 1 cc. of Depo-Medrol and 1 cc. of  Lidocaine near the plantar fascial insertion. Patient tolerates the procedure without difficulty.  Adrian [name], M.D.  Electronically verified by Adrian [name], M.D. RSB:kah Cc:  Adrian Jewel, MD D:  04-24-13 T: 04-25-13  Adrian [name]/[facility] [address]   [address] [place], [place] [address] [phone] A Division of [facility]  Adrian [name], M.D.   Robert A. Thurston Hole, M.D.   Burnell Blanks, M.D.   Adrian [name], M.D.   Lunette Stands, M.D Adrian [name]. Adrian [name], M.D.  Buford Dresser, M.D.  Charlsie Quest, M.D.  Estell Harpin, M.D.   Adrian [name], M.D. Adrian Bad. Willa Rough, PA-C  Kirstin A. Shepperson, PA-C  Adrian [place], PA-C [place], [place]   RE: Adrian [name], Adrian [name]   [id number]      DOB: [birth year] PROGRESS NOTE: 06-04-13 Adrian [name] comes in for his left heel.  Persistent and recalcitrant plantar fasciitis.  Injected by Adrian [name] about a month ago.  It helped with numbing medicine in place but nothing beyond that.  He comes in to discuss definitive treatment.    Of note, I did a plantar fascial release on his right foot back in 2013.  He fully recovered and rehabbed and doing well without any residual issues.  It was very successful for him.    Remaining history, workup and treatment to date were reviewed.  I looked at Dr. Hoy Register note from a month ago as well.  I looked at his op note from the opposite right foot from last year.  EXAMINATION: General exam is outlined and included in the chart.  Specifically normal gait and stance.  On the right, well healed incision.  Full motion.  Well maintained arch.  No soreness, no tenderness, no swelling.  On the left, exquisitely tender plantar fascial attachment to the heel.  Dorsiflexion is a little bit limited with the knee extended but not too much.  Neurovascularly intact distally.  No Achilles tendonitis.    DISPOSITION: Although we can repeat shots, repeat courses of therapy, I don't think this is going to resolve until we do a plantar fascial release.  He completely understands and agrees.  We discussed proceeding with this in his left heel just as we did on the right.  More  than 25 minutes was spent in face-to-face covering all of this with him.  Procedure, risks, benefits, and complications were reviewed.  Paperwork completed and all questions were answered.  I will see him at the time of operative intervention.     Adrian [name], M.D.  Electronically verified by Adrian [name], M.D. DFM:gde D 06-04-13 T 06-05-13

## 2013-06-13 ENCOUNTER — Encounter (HOSPITAL_BASED_OUTPATIENT_CLINIC_OR_DEPARTMENT_OTHER): Payer: Self-pay

## 2013-06-13 ENCOUNTER — Ambulatory Visit (HOSPITAL_BASED_OUTPATIENT_CLINIC_OR_DEPARTMENT_OTHER)
Admission: RE | Admit: 2013-06-13 | Discharge: 2013-06-13 | Disposition: A | Discharge status: Home / Self Care | Payer: Federal, State, Local not specified - PPO | Source: Ambulatory Visit | Attending: Orthopedic Surgery | Admitting: Orthopedic Surgery

## 2013-06-13 ENCOUNTER — Encounter (HOSPITAL_BASED_OUTPATIENT_CLINIC_OR_DEPARTMENT_OTHER)
Admission: RE | Disposition: A | Discharge status: Home / Self Care | Payer: Self-pay | Source: Ambulatory Visit | Attending: Orthopedic Surgery

## 2013-06-13 ENCOUNTER — Ambulatory Visit (HOSPITAL_BASED_OUTPATIENT_CLINIC_OR_DEPARTMENT_OTHER): Payer: Federal, State, Local not specified - PPO | Admitting: Anesthesiology

## 2013-06-13 ENCOUNTER — Encounter (HOSPITAL_BASED_OUTPATIENT_CLINIC_OR_DEPARTMENT_OTHER): Payer: Self-pay | Admitting: Anesthesiology

## 2013-06-13 DIAGNOSIS — Z01812 Encounter for preprocedural laboratory examination: Secondary | ICD-10-CM | POA: Insufficient documentation

## 2013-06-13 DIAGNOSIS — M722 Plantar fascial fibromatosis: Secondary | ICD-10-CM

## 2013-06-13 DIAGNOSIS — M7730 Calcaneal spur, unspecified foot: Secondary | ICD-10-CM | POA: Insufficient documentation

## 2013-06-13 HISTORY — DX: Personal history of urinary calculi: Z87.442

## 2013-06-13 HISTORY — DX: Plantar fascial fibromatosis: M72.2

## 2013-06-13 HISTORY — DX: Dental restoration status: Z98.811

## 2013-06-13 HISTORY — PX: PLANTAR FASCIA RELEASE: SHX2239

## 2013-06-13 SURGERY — FASCIOTOMY, PLANTAR, ENDOSCOPIC
Anesthesia: General | Site: Foot | Laterality: Left | Wound class: Clean

## 2013-06-13 MED ORDER — FENTANYL CITRATE 0.05 MG/ML IJ SOLN
INTRAMUSCULAR | Status: DC | PRN
  Administered 2013-06-13: 100 ug via INTRAVENOUS

## 2013-06-13 MED ORDER — DEXAMETHASONE SODIUM PHOSPHATE 4 MG/ML IJ SOLN
INTRAMUSCULAR | Status: DC | PRN
  Administered 2013-06-13: 10 mg via INTRAVENOUS

## 2013-06-13 MED ORDER — CEFAZOLIN SODIUM-DEXTROSE 2-3 GM-% IV SOLR
2.0000 g | INTRAVENOUS | Status: AC
  Administered 2013-06-13: 2 g via INTRAVENOUS

## 2013-06-13 MED ORDER — ONDANSETRON HCL 8 MG PO TABS: 8.0000 mg | ORAL_TABLET | Freq: Three times a day (TID) | ORAL | Status: DC | PRN

## 2013-06-13 MED ORDER — DEXTROSE-NACL 5-0.45 % IV SOLN: INTRAVENOUS | Status: DC

## 2013-06-13 MED ORDER — MIDAZOLAM HCL 2 MG/ML PO SYRP: 12.0000 mg | ORAL_SOLUTION | Freq: Once | ORAL | Status: DC | PRN

## 2013-06-13 MED ORDER — HYDROCODONE-ACETAMINOPHEN 10-325 MG PO TABS: 1.0000 | ORAL_TABLET | Freq: Four times a day (QID) | ORAL | Status: DC | PRN

## 2013-06-13 MED ORDER — ACETAMINOPHEN 500 MG PO TABS
1000.0000 mg | ORAL_TABLET | Freq: Once | ORAL | Status: AC
  Administered 2013-06-13: 1000 mg via ORAL

## 2013-06-13 MED ORDER — LACTATED RINGERS IV SOLN
INTRAVENOUS | Status: DC
  Administered 2013-06-13: 10:00:00 via INTRAVENOUS

## 2013-06-13 MED ORDER — ONDANSETRON HCL 4 MG/2ML IJ SOLN
INTRAMUSCULAR | Status: DC | PRN
  Administered 2013-06-13: 4 mg via INTRAVENOUS

## 2013-06-13 MED ORDER — FENTANYL CITRATE 0.05 MG/ML IJ SOLN: 50.0000 ug | INTRAMUSCULAR | Status: DC | PRN

## 2013-06-13 MED ORDER — MIDAZOLAM HCL 2 MG/2ML IJ SOLN: 1.0000 mg | INTRAMUSCULAR | Status: DC | PRN

## 2013-06-13 MED ORDER — MIDAZOLAM HCL 5 MG/5ML IJ SOLN
INTRAMUSCULAR | Status: DC | PRN
  Administered 2013-06-13: 2 mg via INTRAVENOUS

## 2013-06-13 MED ORDER — PROPOFOL 10 MG/ML IV BOLUS
INTRAVENOUS | Status: DC | PRN
  Administered 2013-06-13: 250 mg via INTRAVENOUS
  Administered 2013-06-13: 50 mg via INTRAVENOUS

## 2013-06-13 MED ORDER — BUPIVACAINE HCL (PF) 0.5 % IJ SOLN
INTRAMUSCULAR | Status: DC | PRN
  Administered 2013-06-13: 10 mL

## 2013-06-13 MED ORDER — LIDOCAINE HCL (CARDIAC) 20 MG/ML IV SOLN
INTRAVENOUS | Status: DC | PRN
  Administered 2013-06-13: 80 mg via INTRAVENOUS

## 2013-06-13 MED ORDER — CHLORHEXIDINE GLUCONATE 4 % EX LIQD: 60.0000 mL | Freq: Once | CUTANEOUS | Status: DC

## 2013-06-13 SURGICAL SUPPLY — 49 items
APPLICATOR COTTON TIP 6IN STRL (MISCELLANEOUS) ×10 IMPLANT
BANDAGE ELASTIC 3 VELCRO ST LF (GAUZE/BANDAGES/DRESSINGS) ×2 IMPLANT
BANDAGE ELASTIC 4 VELCRO ST LF (GAUZE/BANDAGES/DRESSINGS) ×2 IMPLANT
BANDAGE ESMARK 6X9 LF (GAUZE/BANDAGES/DRESSINGS) ×1 IMPLANT
BLADE CUTTER GATOR 3.5 (BLADE) ×2 IMPLANT
BLADE GREAT WHITE 4.2 (BLADE) IMPLANT
BLADE SURG 15 STRL LF DISP TIS (BLADE) ×1 IMPLANT
BLADE SURG 15 STRL SS (BLADE) ×1
BLADE TRIANGLE EPF/EGR ENDO (BLADE) ×2 IMPLANT
BNDG COHESIVE 4X5 TAN STRL (GAUZE/BANDAGES/DRESSINGS) ×2 IMPLANT
BNDG ESMARK 6X9 LF (GAUZE/BANDAGES/DRESSINGS) ×2
BUR CUDA 2.9 (BURR) IMPLANT
BUR GATOR 2.9 (BURR) IMPLANT
CANISTER SUCTION 1200CC (MISCELLANEOUS) IMPLANT
CLOTH BEACON ORANGE TIMEOUT ST (SAFETY) IMPLANT
COVER TABLE BACK 60X90 (DRAPES) ×2 IMPLANT
CUFF TOURNIQUET SINGLE 18IN (TOURNIQUET CUFF) IMPLANT
CUFF TOURNIQUET SINGLE 34IN LL (TOURNIQUET CUFF) ×2 IMPLANT
DECANTER SPIKE VIAL GLASS SM (MISCELLANEOUS) IMPLANT
DRAPE EXTREMITY T 121X128X90 (DRAPE) ×2 IMPLANT
DRAPE OEC MINIVIEW 54X84 (DRAPES) ×2 IMPLANT
DRAPE U 20/CS (DRAPES) ×2 IMPLANT
DRAPE U-SHAPE 47X51 STRL (DRAPES) ×2 IMPLANT
DURAPREP 26ML APPLICATOR (WOUND CARE) ×2 IMPLANT
GAUZE XEROFORM 1X8 LF (GAUZE/BANDAGES/DRESSINGS) ×2 IMPLANT
GLOVE BIO SURGEON STRL SZ8 (GLOVE) ×2 IMPLANT
GLOVE BIOGEL PI IND STRL 7.0 (GLOVE) ×1 IMPLANT
GLOVE BIOGEL PI IND STRL 8.5 (GLOVE) ×1 IMPLANT
GLOVE BIOGEL PI INDICATOR 7.0 (GLOVE) ×1
GLOVE BIOGEL PI INDICATOR 8.5 (GLOVE) ×1
GLOVE ECLIPSE 6.5 STRL STRAW (GLOVE) ×2 IMPLANT
GLOVE ORTHO TXT STRL SZ7.5 (GLOVE) ×4 IMPLANT
GOWN BRE IMP PREV XXLGXLNG (GOWN DISPOSABLE) ×2 IMPLANT
GOWN PREVENTION PLUS XLARGE (GOWN DISPOSABLE) ×2 IMPLANT
NEEDLE HYPO 22GX1.5 SAFETY (NEEDLE) IMPLANT
NS IRRIG 1000ML POUR BTL (IV SOLUTION) ×2 IMPLANT
PACK BASIN DAY SURGERY FS (CUSTOM PROCEDURE TRAY) ×2 IMPLANT
PAD CAST 3X4 CTTN HI CHSV (CAST SUPPLIES) ×1 IMPLANT
PADDING CAST ABS 3INX4YD NS (CAST SUPPLIES) ×1
PADDING CAST ABS COTTON 3X4 (CAST SUPPLIES) ×1 IMPLANT
PADDING CAST COTTON 3X4 STRL (CAST SUPPLIES) ×1
SPONGE GAUZE 4X4 12PLY (GAUZE/BANDAGES/DRESSINGS) ×2 IMPLANT
STOCKINETTE 6  STRL (DRAPES) ×1
STOCKINETTE 6 STRL (DRAPES) ×1 IMPLANT
SUT ETHILON 3 0 PS 1 (SUTURE) ×2 IMPLANT
SYR BULB 3OZ (MISCELLANEOUS) ×2 IMPLANT
SYR CONTROL 10ML LL (SYRINGE) IMPLANT
TUBE CONNECTING 20X1/4 (TUBING) ×2 IMPLANT
UNDERPAD 30X30 INCONTINENT (UNDERPADS AND DIAPERS) ×2 IMPLANT

## 2013-06-13 NOTE — Interval H&P Note (Signed)
History and Physical Interval Note:  06/13/2013 7:42 AM  Adrian [name]  has presented today for surgery, with the diagnosis of LEFT FOOT PLANTAR FASCIITIS  The various methods of treatment have been discussed with the patient and family. After consideration of risks, benefits and other options for treatment, the patient has consented to  Procedure(s): ENDOSCOPIC PLANTAR FASCIOTOMY (Left) as a surgical intervention .  The patient's history has been reviewed, patient examined, no change in status, stable for surgery.  I have reviewed the patient's chart and labs.  Questions were answered to the patient's satisfaction.     MURPHY,DANIEL F

## 2013-06-13 NOTE — Anesthesia Preprocedure Evaluation (Signed)
Anesthesia Evaluation  Patient identified by MRN, date of birth, ID band Patient awake    Reviewed: Allergy & Precautions, H&P , NPO status , Patient's Chart, lab work & pertinent test results  Airway Mallampati: I TM Distance: >3 FB Neck ROM: Full    Dental  (+) Teeth Intact and Dental Advisory Given   Pulmonary  breath sounds clear to auscultation        Cardiovascular Rhythm:Regular Rate:Normal     Neuro/Psych    GI/Hepatic   Endo/Other    Renal/GU      Musculoskeletal   Abdominal   Peds  Hematology   Anesthesia Other Findings   Reproductive/Obstetrics                           Anesthesia Physical Anesthesia Plan  ASA: I  Anesthesia Plan:    Post-op Pain Management:    Induction: Intravenous  Airway Management Planned:   Additional Equipment:   Intra-op Plan:   Post-operative Plan: Extubation in OR  Informed Consent: I have reviewed the patients History and Physical, chart, labs and discussed the procedure including the risks, benefits and alternatives for the proposed anesthesia with the patient or authorized representative who has indicated his/her understanding and acceptance.   Dental advisory given  Plan Discussed with: CRNA, Anesthesiologist and Surgeon  Anesthesia Plan Comments:         Anesthesia Quick Evaluation

## 2013-06-13 NOTE — Transfer of Care (Signed)
Immediate Anesthesia Transfer of Care Note  Patient: Adrian [name]  Procedure(s) Performed: Procedure(s): ENDOSCOPIC PLANTAR FASCIOTOMY (Left)  Patient Location: PACU  Anesthesia Type:General  Level of Consciousness: sedated  Airway & Oxygen Therapy: Patient Spontanous Breathing and Patient connected to face mask oxygen  Post-op Assessment: Report given to PACU RN and Post -op Vital signs reviewed and stable  Post vital signs: Reviewed and stable  Complications: No apparent anesthesia complications

## 2013-06-13 NOTE — Anesthesia Procedure Notes (Signed)
Procedure Name: LMA Insertion Date/Time: 06/13/2013 10:34 AM Performed by: Gar Gibbon Pre-anesthesia Checklist: Patient identified, Emergency Drugs available, Suction available and Patient being monitored Patient Re-evaluated:Patient Re-evaluated prior to inductionOxygen Delivery Method: Circle System Utilized Preoxygenation: Pre-oxygenation with 100% oxygen Intubation Type: IV induction Ventilation: Mask ventilation without difficulty LMA: LMA inserted LMA Size: 4.0 Number of attempts: 1 Airway Equipment and Method: bite block Placement Confirmation: positive ETCO2 Tube secured with: Tape Dental Injury: Teeth and Oropharynx as per pre-operative assessment

## 2013-06-14 ENCOUNTER — Encounter (HOSPITAL_BASED_OUTPATIENT_CLINIC_OR_DEPARTMENT_OTHER): Payer: Self-pay | Admitting: Orthopedic Surgery

## 2013-06-14 NOTE — Anesthesia Postprocedure Evaluation (Signed)
  Anesthesia Post-op Note  Patient: Adrian [name]  Procedure(s) Performed: Procedure(s): ENDOSCOPIC PLANTAR FASCIOTOMY (Left)  Patient Location: PACU  Anesthesia Type:General  Level of Consciousness: awake, alert  and oriented  Airway and Oxygen Therapy: Patient Spontanous Breathing  Post-op Pain: mild  Post-op Assessment: Post-op Vital signs reviewed  Post-op Vital Signs: Reviewed  Complications: No apparent anesthesia complications

## 2013-06-14 NOTE — Op Note (Signed)
NAMEDaisean, Adrian [name]                   ACCOUNT NO.:  [id number]  MEDICAL RECORD NO.:  [id number]  LOCATION:                               FACILITY:  [facility]  PHYSICIAN:  Loreta Ave, M.D. DATE OF BIRTH:  10/24/1960  DATE OF PROCEDURE:  06/13/2013 DATE OF DISCHARGE:  06/13/2013                              OPERATIVE REPORT   PREOPERATIVE DIAGNOSIS:  Left foot recalcitrant chronic plantar fasciitis.  POSTOPERATIVE DIAGNOSIS:  Left foot recalcitrant chronic plantar fasciitis.  PROCEDURE:  Endoscopic plantar fascia release, left heel.  SURGEON:  Loreta Ave, M.D.  ASSISTANT:  Domingo Cocking, PA-C  ANESTHESIA:  General.  BLOOD LOSS:  Minimal.  SPECIMENS:  None.  CULTURES:  None.  COMPLICATION:  None.  DRESSINGS:  Soft compressive short leg splint.  TOURNIQUET TIME:  25 minutes.  DESCRIPTION OF PROCEDURE:  The patient was brought to the operating room, placed on the operating table in supine position.  After adequate anesthesia had been obtained, tourniquet applied.  Prepped and draped in usual sterile fashion.  Exsanguinated with elevation of Esmarch. Tourniquet inflated to 300 mmHg.  With fluoroscopic guidance, this plantar fascia attachment to the os calcis identified.  Small stab wound made on either side.  Cannula was placed on the plantar side of the plantar fascia confirmed good position with fluoroscopy.  Under arthroscopic guidance, plantar fascia was released from medial to lateral all the way through a thickness.  Once this was confirmed, wound was irrigated.  Injected Marcaine.  Cannula removed. Portals were closed with nylon.  Sterile compressive dressing applied. Tourniquet deflated and removed.  Short leg splint applied.  Anesthesia reversed.  Brought to the recovery room.  Tolerated the surgery well with no complications.     Loreta Ave, M.D.   ______________________________ Loreta Ave, M.D.    DFM/MEDQ  D:  06/13/2013  T:   06/14/2013  Job:  [id number]

## 2013-07-13 ENCOUNTER — Emergency Department (HOSPITAL_BASED_OUTPATIENT_CLINIC_OR_DEPARTMENT_OTHER)
Admission: EM | Admit: 2013-07-13 | Discharge: 2013-07-13 | Disposition: A | Discharge status: Home / Self Care | Payer: Federal, State, Local not specified - PPO | Attending: Emergency Medicine | Admitting: Emergency Medicine

## 2013-07-13 ENCOUNTER — Encounter (HOSPITAL_BASED_OUTPATIENT_CLINIC_OR_DEPARTMENT_OTHER): Payer: Self-pay | Admitting: Emergency Medicine

## 2013-07-13 ENCOUNTER — Emergency Department (HOSPITAL_BASED_OUTPATIENT_CLINIC_OR_DEPARTMENT_OTHER): Payer: Federal, State, Local not specified - PPO

## 2013-07-13 DIAGNOSIS — Z8739 Personal history of other diseases of the musculoskeletal system and connective tissue: Secondary | ICD-10-CM | POA: Insufficient documentation

## 2013-07-13 DIAGNOSIS — Z98811 Dental restoration status: Secondary | ICD-10-CM | POA: Insufficient documentation

## 2013-07-13 DIAGNOSIS — N509 Disorder of male genital organs, unspecified: Secondary | ICD-10-CM | POA: Insufficient documentation

## 2013-07-13 DIAGNOSIS — N200 Calculus of kidney: Secondary | ICD-10-CM | POA: Diagnosis present

## 2013-07-13 LAB — BASIC METABOLIC PANEL
Chloride: 102 mEq/L (ref 96–112)
Creatinine, Ser: 1.3 mg/dL (ref 0.50–1.35)
GFR calc Af Amer: 71 mL/min — ABNORMAL LOW (ref >90)
Potassium: 3.9 mEq/L (ref 3.5–5.1)
Sodium: 140 mEq/L (ref 135–145)

## 2013-07-13 LAB — URINALYSIS, ROUTINE W REFLEX MICROSCOPIC
Bilirubin Urine: NEGATIVE
Glucose, UA: NEGATIVE mg/dL
Ketones, ur: NEGATIVE mg/dL
Leukocytes, UA: NEGATIVE
Nitrite: NEGATIVE
Protein, ur: NEGATIVE mg/dL
Urobilinogen, UA: 0.2 mg/dL (ref 0.0–1.0)

## 2013-07-13 LAB — CBC WITH DIFFERENTIAL/PLATELET
Basophils Absolute: 0.1 10*3/uL (ref 0.0–0.1)
Basophils Relative: 1 % (ref 0–1)
HCT: 43.9 % (ref 39.0–52.0)
MCH: 29.8 pg (ref 26.0–34.0)
MCHC: 34.4 g/dL (ref 30.0–36.0)
Monocytes Absolute: 1 10*3/uL (ref 0.1–1.0)
Monocytes Relative: 10 % (ref 3–12)
Neutro Abs: 4.3 10*3/uL (ref 1.7–7.7)
Neutrophils Relative %: 45 % (ref 43–77)
Platelets: 251 10*3/uL (ref 150–400)
RDW: 12.7 % (ref 11.5–15.5)

## 2013-07-13 LAB — URINE MICROSCOPIC-ADD ON

## 2013-07-13 MED ORDER — SODIUM CHLORIDE 0.9 % IV BOLUS (SEPSIS)
1000.0000 mL | INTRAVENOUS | Status: AC
  Administered 2013-07-13: 1000 mL via INTRAVENOUS

## 2013-07-13 MED ORDER — HYDROMORPHONE HCL PF 1 MG/ML IJ SOLN
1.0000 mg | INTRAMUSCULAR | Status: AC
  Administered 2013-07-13: 1 mg via INTRAVENOUS
  Filled 2013-07-13: qty 1

## 2013-07-13 MED ORDER — ONDANSETRON 4 MG PO TBDP: ORAL_TABLET | ORAL | Status: DC

## 2013-07-13 MED ORDER — HYDROCODONE-ACETAMINOPHEN 5-325 MG PO TABS
1.0000 | ORAL_TABLET | Freq: Once | ORAL | Status: AC
  Administered 2013-07-13: 1 via ORAL
  Filled 2013-07-13: qty 1

## 2013-07-13 MED ORDER — ONDANSETRON HCL 4 MG/2ML IJ SOLN
4.0000 mg | Freq: Once | INTRAMUSCULAR | Status: AC
  Administered 2013-07-13: 4 mg via INTRAVENOUS
  Filled 2013-07-13: qty 2

## 2013-07-13 MED ORDER — HYDROCODONE-ACETAMINOPHEN 5-500 MG PO TABS: 1.0000 | ORAL_TABLET | Freq: Four times a day (QID) | ORAL | Status: DC | PRN

## 2013-07-13 NOTE — ED Provider Notes (Signed)
CSN: [id number]     Arrival date & time 07/13/13  2053 History  This chart was scribed for Junius Argyle, MD by Bennett Scrape, ED Scribe. This patient was seen in room MH05/MH05 and the patient's care was started at 9:16 PM.   Chief Complaint  Patient presents with  . Flank Pain    Patient is a 52 y.o. male presenting with flank pain. The history is provided by the patient. No language interpreter was used.  Flank Pain The current episode started less than 1 hour ago. The problem occurs constantly. The problem has been rapidly improving. Pertinent negatives include no chest pain, no abdominal pain, no headaches and no shortness of breath. Nothing aggravates the symptoms. Relieved by: pressure. He has tried nothing for the symptoms.    HPI Comments: Adrian [name] is a 52 y.o. male who presents to the Emergency Department complaining of right flank that radiates into his right testicle with associated nausea that started about 45 minutes ago. He reports feeling a twinge yesterday that resolved but states that he had a sudden onset of pain tonight at a friend's house. He states that the pain has improved since onset and is generally relieved with manual pressure. He reports 3 prior episodes of the same attributed to kidney stones that passed after a lithotripsy. He denies any hematuria or emesis. He reports a recent surgery to the left foot for plantar fascitis with improvement in his heel pain. He states that the surgical sites are healing well.    Past Medical History  Diagnosis Date  . Plantar fasciitis of left foot 05/2013  . History of kidney stones   . Dental crowns present    Past Surgical History  Procedure Laterality Date  . Knee arthroscopy Left   . Lithotripsy    . Mandible surgery    . Plantar fascia release  06/28/2012    Procedure: ENDOSCOPIC PLANTAR FASCIOTOMY;  Surgeon: Loreta Ave, MD;  Location: [facility];  Service: Orthopedics;  Laterality: Right;   . Breast enhancement surgery Left     asymmetry of chest wall  . Closed reduction metacarpal with percutaneous pinning Right 12/22/2004    5th metacarpal  . Plantar fascia release Left 06/13/2013    Procedure: ENDOSCOPIC PLANTAR FASCIOTOMY;  Surgeon: Loreta Ave, MD;  Location: [facility];  Service: Orthopedics;  Laterality: Left;   History reviewed. No pertinent family history. History  Substance Use Topics  . Smoking status: Never Smoker   . Smokeless tobacco: Never Used  . Alcohol Use: No    Review of Systems  Constitutional: Negative for fever and fatigue.  HENT: Negative for congestion and drooling.   Eyes: Negative for pain.  Respiratory: Negative for cough and shortness of breath.   Cardiovascular: Negative for chest pain.  Gastrointestinal: Positive for nausea. Negative for vomiting, abdominal pain and diarrhea.  Genitourinary: Positive for flank pain and testicular pain. Negative for dysuria and hematuria.  Musculoskeletal: Negative for neck pain.  Skin: Negative for color change.  Neurological: Negative for dizziness and headaches.  Hematological: Negative for adenopathy.  Psychiatric/Behavioral: Negative for behavioral problems.  All other systems reviewed and are negative.    Allergies  Oxycodone and Hydrocodone  Home Medications   Current Outpatient Rx  Name  Route  Sig  Dispense  Refill  . HYDROcodone-acetaminophen (NORCO) 10-325 MG per tablet   Oral   Take 1-2 tablets by mouth every 6 (six) hours as needed for pain.  60 tablet   0   . ondansetron (ZOFRAN) 8 MG tablet   Oral   Take 1 tablet (8 mg total) by mouth every 8 (eight) hours as needed for nausea.   20 tablet   0    Triage Vitals: BP 161/99  Pulse 96  Temp(Src) 98.5 F (36.9 C) (Oral)  Resp 18  Ht 5\' 11"  (1.803 m)  Wt 190 lb (86.183 kg)  BMI 26.51 kg/m2  SpO2 96%  Physical Exam  Nursing note and vitals reviewed. Constitutional: He is oriented to person, place,  and time. He appears well-developed and well-nourished. No distress.  HENT:  Head: Normocephalic and atraumatic.  Eyes: Conjunctivae and EOM are normal. Pupils are equal, round, and reactive to light.  Neck: Neck supple. No tracheal deviation present.  Cardiovascular: Normal rate and regular rhythm.   Pulmonary/Chest: Effort normal and breath sounds normal. No respiratory distress.  Abdominal: Soft. There is no tenderness.  Musculoskeletal: Normal range of motion.  No significant CVA tenderness to either side   Neurological: He is alert and oriented to person, place, and time.  Skin: Skin is warm and dry.  Psychiatric: He has a normal mood and affect. His behavior is normal.    ED Course  Procedures (including critical care time)  DIAGNOSTIC STUDIES: Oxygen Saturation is 96% on room air, normal by my interpretation.    COORDINATION OF CARE: 9:19 PM-Discussed treatment plan which includes CT of abdomen and pain medications with pt at bedside and pt agreed to plan.   Labs Review Labs Reviewed  URINALYSIS, ROUTINE W REFLEX MICROSCOPIC - Abnormal; Notable for the following:    Hgb urine dipstick LARGE (*)    All other components within normal limits  BASIC METABOLIC PANEL - Abnormal; Notable for the following:    Glucose, Bld 147 (*)    GFR calc non Af Amer 61 (*)    GFR calc Af Amer 71 (*)    All other components within normal limits  CBC WITH DIFFERENTIAL  URINE MICROSCOPIC-ADD ON   Imaging Review Ct Abdomen Pelvis Wo Contrast  07/13/2013   CLINICAL DATA:  Right flank pain radiating to right testicle associated with nausea, began 45 min prior to arrival, history of kidney stones post lithotripsy  EXAM: CT ABDOMEN AND PELVIS WITHOUT CONTRAST  TECHNIQUE: Multidetector CT imaging of the abdomen and pelvis was performed following the standard protocol without intravenous contrast. Sagittal and coronal MPR images reconstructed from axial data set.  COMPARISON:  05/01/2008  FINDINGS:  Left anterior chest wall implant.  Bibasilar atelectasis.  Tiny bilateral nonobstructing right renal calculi.  In addition, mild right hydronephrosis and hydroureter secondary to 5 x 2 mm distal right ureteral calculus just above ureterovesical junction image 88.  No left-sided urinary tract calcification or dilatation.  Within limits of a nonenhanced exam no additional focal abnormalities of the liver, spleen, pancreas, kidneys, or adrenal glands.  Food debris distends stomach.  Stomach and bowel loops normal appearance.  No mass, adenopathy, free fluid or inflammatory process.  No acute osseous findings.  IMPRESSION: Mild right hydronephrosis and hydroureter secondary to a 5 x 2 mm distal right ureteral calculus.  Additional tiny nonobstructing right renal calculi.   Electronically Signed   By: Ulyses Southward M.D.   On: 07/13/2013 22:42    EKG Interpretation   None       MDM   1. Nephrolithiasis    9:31 PM 52 y.o. male with history of kidney stones presents with right  flank pain which began suddenly this evening. He notes nausea but no vomiting. He denies any fevers. He is afebrile and vital signs are unremarkable here. He notes that he required lithotripsy for his previous to kidney stones. Will get lab work, IV fluid, pain control, and CT scan.    Found to have 5x2 mm distal right ureteral calculus. Labs/imaging otherwise non-contrib.  I have discussed the diagnosis/risks/treatment options with the patient and believe the pt to be eligible for discharge home to follow-up with his urologist next week. We also discussed returning to the ED immediately if new or worsening sx occur. We discussed the sx which are most concerning (e.g., worsening pain, inability to tolerate po) that necessitate immediate return. Any new prescriptions provided to the patient are listed below.  Discharge Medication List as of 07/13/2013 11:02 PM    START taking these medications   Details  HYDROcodone-acetaminophen  (VICODIN) 5-500 MG per tablet Take 1 tablet by mouth every 6 (six) hours as needed for pain., Starting 07/13/2013, Until Discontinued, Print    ondansetron (ZOFRAN ODT) 4 MG disintegrating tablet 4mg  ODT q4 hours prn nausea/vomit, Print          I personally performed the services described in this documentation, which was scribed in my presence. The recorded information has been reviewed and is accurate.    Junius Argyle, MD 07/14/13 1052

## 2013-07-13 NOTE — ED Notes (Signed)
Pt c/o right sided flank pain, states "my kidney is burning" reports previous episodes of kidney stones and this is similar.

## 2013-07-22 ENCOUNTER — Encounter (HOSPITAL_BASED_OUTPATIENT_CLINIC_OR_DEPARTMENT_OTHER): Payer: Self-pay | Admitting: Orthopedic Surgery

## 2013-12-09 ENCOUNTER — Other Ambulatory Visit: Payer: Federal, State, Local not specified - PPO

## 2013-12-10 ENCOUNTER — Other Ambulatory Visit (INDEPENDENT_AMBULATORY_CARE_PROVIDER_SITE_OTHER): Payer: Federal, State, Local not specified - PPO

## 2013-12-10 DIAGNOSIS — Z0000 Encounter for general adult medical examination without abnormal findings: Secondary | ICD-10-CM

## 2013-12-10 LAB — CBC WITH DIFFERENTIAL/PLATELET
Basophils Absolute: 0.1 10*3/uL (ref 0.0–0.1)
Basophils Relative: 1 % (ref 0.0–3.0)
EOS ABS: 0.2 10*3/uL (ref 0.0–0.7)
Eosinophils Relative: 3 % (ref 0.0–5.0)
HCT: 46.7 % (ref 39.0–52.0)
Hemoglobin: 15.8 g/dL (ref 13.0–17.0)
Lymphocytes Relative: 39.9 % (ref 12.0–46.0)
Lymphs Abs: 2.3 10*3/uL (ref 0.7–4.0)
MCHC: 33.9 g/dL (ref 30.0–36.0)
MCV: 88.1 fl (ref 78.0–100.0)
MONO ABS: 0.5 10*3/uL (ref 0.1–1.0)
Monocytes Relative: 8.1 % (ref 3.0–12.0)
NEUTROS ABS: 2.7 10*3/uL (ref 1.4–7.7)
NEUTROS PCT: 48 % (ref 43.0–77.0)
Platelets: 243 10*3/uL (ref 150.0–400.0)
RBC: 5.3 Mil/uL (ref 4.22–5.81)
RDW: 13.1 % (ref 11.5–14.6)
WBC: 5.7 10*3/uL (ref 4.5–10.5)

## 2013-12-10 LAB — POCT URINALYSIS DIPSTICK
BILIRUBIN UA: NEGATIVE
Glucose, UA: NEGATIVE
Ketones, UA: NEGATIVE
LEUKOCYTES UA: NEGATIVE
Nitrite, UA: NEGATIVE
PH UA: 6
Protein, UA: NEGATIVE
RBC UA: NEGATIVE
Spec Grav, UA: 1.025
UROBILINOGEN UA: 0.2

## 2013-12-10 LAB — LIPID PANEL
CHOL/HDL RATIO: 3
Cholesterol: 187 mg/dL (ref 0–200)
HDL: 56.8 mg/dL (ref >39.00)
LDL Cholesterol: 121 mg/dL — ABNORMAL HIGH (ref 0–99)
Triglycerides: 48 mg/dL (ref 0.0–149.0)
VLDL: 9.6 mg/dL (ref 0.0–40.0)

## 2013-12-10 LAB — HEPATIC FUNCTION PANEL
ALBUMIN: 4.1 g/dL (ref 3.5–5.2)
ALT: 19 U/L (ref 0–53)
AST: 18 U/L (ref 0–37)
Alkaline Phosphatase: 68 U/L (ref 39–117)
BILIRUBIN DIRECT: 0.1 mg/dL (ref 0.0–0.3)
TOTAL PROTEIN: 6.9 g/dL (ref 6.0–8.3)
Total Bilirubin: 0.6 mg/dL (ref 0.3–1.2)

## 2013-12-10 LAB — BASIC METABOLIC PANEL
BUN: 18 mg/dL (ref 6–23)
CO2: 26 meq/L (ref 19–32)
CREATININE: 1.2 mg/dL (ref 0.4–1.5)
Calcium: 9.4 mg/dL (ref 8.4–10.5)
Chloride: 105 mEq/L (ref 96–112)
GFR: 65.11 mL/min (ref >60.00)
Glucose, Bld: 95 mg/dL (ref 70–99)
POTASSIUM: 4.4 meq/L (ref 3.5–5.1)
Sodium: 138 mEq/L (ref 135–145)

## 2013-12-10 LAB — TSH: TSH: 1.05 u[IU]/mL (ref 0.35–5.50)

## 2013-12-10 LAB — PSA: PSA: 4.51 ng/mL — AB (ref 0.10–4.00)

## 2013-12-16 ENCOUNTER — Encounter: Payer: Self-pay | Admitting: Family Medicine

## 2013-12-16 ENCOUNTER — Ambulatory Visit (INDEPENDENT_AMBULATORY_CARE_PROVIDER_SITE_OTHER): Payer: Federal, State, Local not specified - PPO | Admitting: Family Medicine

## 2013-12-16 VITALS — BP 102/80 | Temp 98.2°F | Ht 72.0 in | Wt 192.0 lb

## 2013-12-16 DIAGNOSIS — Z87448 Personal history of other diseases of urinary system: Secondary | ICD-10-CM

## 2013-12-16 DIAGNOSIS — R972 Elevated prostate specific antigen [PSA]: Secondary | ICD-10-CM | POA: Insufficient documentation

## 2013-12-16 DIAGNOSIS — Z0000 Encounter for general adult medical examination without abnormal findings: Secondary | ICD-10-CM | POA: Insufficient documentation

## 2013-12-16 NOTE — Progress Notes (Signed)
Pre visit review using our clinic review tool, if applicable. No additional management support is needed unless otherwise documented below in the visit note. 

## 2013-12-16 NOTE — Progress Notes (Signed)
   Subjective:    Patient ID: Adrian [name], male    DOB: 1961/06/10, 53 y.o.   MRN: [id number]  HPI Adrian [name] is a 53 year old married male nonsmoker,,,,,,, who works at the [employer].Marland KitchenMarland KitchenMarland KitchenMarland Kitchen who comes in today for general physical examination  He's always been in excellent health he said no chronic health problems nor does he take a medication on a daily basis  He gets routine eye care, dental care, colonoscopy at age 53 normal, tetanus booster 2008   Review of Systems  Constitutional: Negative.   HENT: Negative.   Eyes: Negative.   Respiratory: Negative.   Cardiovascular: Negative.   Gastrointestinal: Negative.   Genitourinary: Negative.   Musculoskeletal: Negative.   Skin: Negative.   Neurological: Negative.   Psychiatric/Behavioral: Negative.        Objective:   Physical Exam  Nursing note and vitals reviewed. Constitutional: He is oriented to person, place, and time. He appears well-developed and well-nourished.  HENT:  Head: Normocephalic and atraumatic.  Right Ear: External ear normal.  Left Ear: External ear normal.  Nose: Nose normal.  Mouth/Throat: Oropharynx is clear and moist.  Eyes: Conjunctivae and EOM are normal. Pupils are equal, round, and reactive to light.  Neck: Normal range of motion. Neck supple. No JVD present. No tracheal deviation present. No thyromegaly present.  Cardiovascular: Normal rate, regular rhythm, normal heart sounds and intact distal pulses.  Exam reveals no gallop and no friction rub.   No murmur heard. No carotid nor aortic recent peripheral pulses 2+ and symmetrical  Pulmonary/Chest: Effort normal and breath sounds normal. No stridor. No respiratory distress. He has no wheezes. He has no rales. He exhibits no tenderness.  Abdominal: Soft. Bowel sounds are normal. He exhibits no distension and no mass. There is no tenderness. There is no rebound and no guarding.  Genitourinary: Rectum normal, prostate normal and penis normal. Guaiac negative  stool. No penile tenderness.  Musculoskeletal: Normal range of motion. He exhibits no edema and no tenderness.  Lymphadenopathy:    He has no cervical adenopathy.  Neurological: He is alert and oriented to person, place, and time. He has normal reflexes. No cranial nerve deficit. He exhibits normal muscle tone.  Skin: Skin is warm and dry. No rash noted. No erythema. No pallor.  Psychiatric: He has a normal mood and affect. His behavior is normal. Judgment and thought content normal.          Assessment & Plan:  Healthy male  Return every 3 years she general physical since he does not have a chronic health problems

## 2013-12-16 NOTE — Patient Instructions (Signed)
Ophthalmologists,,,,,,,,,,,,, Dr. Bing Plume  Dentist.........[name] Dr. Gloriann Loan  Return in 2  years for general physical examination sooner if any problem

## 2015-03-02 ENCOUNTER — Ambulatory Visit: Payer: Federal, State, Local not specified - PPO | Admitting: Internal Medicine

## 2015-03-25 ENCOUNTER — Other Ambulatory Visit (INDEPENDENT_AMBULATORY_CARE_PROVIDER_SITE_OTHER): Payer: Federal, State, Local not specified - PPO

## 2015-03-25 DIAGNOSIS — Z0000 Encounter for general adult medical examination without abnormal findings: Secondary | ICD-10-CM | POA: Diagnosis not present

## 2015-03-25 LAB — POCT URINALYSIS DIPSTICK
BILIRUBIN UA: NEGATIVE
Glucose, UA: NEGATIVE
Ketones, UA: NEGATIVE
Leukocytes, UA: NEGATIVE
Nitrite, UA: NEGATIVE
PROTEIN UA: NEGATIVE
Spec Grav, UA: >=1.03
Urobilinogen, UA: 0.2
pH, UA: 6.5

## 2015-03-25 LAB — HEPATIC FUNCTION PANEL
ALT: 17 U/L (ref 0–53)
AST: 17 U/L (ref 0–37)
Albumin: 4 g/dL (ref 3.5–5.2)
Alkaline Phosphatase: 68 U/L (ref 39–117)
Bilirubin, Direct: 0.1 mg/dL (ref 0.0–0.3)
Total Bilirubin: 0.6 mg/dL (ref 0.2–1.2)
Total Protein: 6.9 g/dL (ref 6.0–8.3)

## 2015-03-25 LAB — BASIC METABOLIC PANEL
BUN: 20 mg/dL (ref 6–23)
CO2: 23 meq/L (ref 19–32)
CREATININE: 1.14 mg/dL (ref 0.40–1.50)
Calcium: 9.3 mg/dL (ref 8.4–10.5)
Chloride: 107 mEq/L (ref 96–112)
GFR: 70.74 mL/min (ref >60.00)
GLUCOSE: 85 mg/dL (ref 70–99)
Potassium: 4.3 mEq/L (ref 3.5–5.1)
Sodium: 139 mEq/L (ref 135–145)

## 2015-03-25 LAB — CBC WITH DIFFERENTIAL/PLATELET
BASOS ABS: 0 10*3/uL (ref 0.0–0.1)
BASOS PCT: 0.9 % (ref 0.0–3.0)
Eosinophils Absolute: 0.2 10*3/uL (ref 0.0–0.7)
Eosinophils Relative: 4 % (ref 0.0–5.0)
HCT: 46.5 % (ref 39.0–52.0)
Hemoglobin: 15.9 g/dL (ref 13.0–17.0)
LYMPHS PCT: 41.9 % (ref 12.0–46.0)
Lymphs Abs: 2.3 10*3/uL (ref 0.7–4.0)
MCHC: 34.1 g/dL (ref 30.0–36.0)
MCV: 86.4 fl (ref 78.0–100.0)
MONOS PCT: 8.9 % (ref 3.0–12.0)
Monocytes Absolute: 0.5 10*3/uL (ref 0.1–1.0)
Neutro Abs: 2.5 10*3/uL (ref 1.4–7.7)
Neutrophils Relative %: 44.3 % (ref 43.0–77.0)
PLATELETS: 259 10*3/uL (ref 150.0–400.0)
RBC: 5.38 Mil/uL (ref 4.22–5.81)
RDW: 13.2 % (ref 11.5–15.5)
WBC: 5.6 10*3/uL (ref 4.0–10.5)

## 2015-03-25 LAB — LIPID PANEL
CHOLESTEROL: 181 mg/dL (ref 0–200)
HDL: 51.9 mg/dL (ref >39.00)
LDL Cholesterol: 113 mg/dL — ABNORMAL HIGH (ref 0–99)
NONHDL: 129.1
Total CHOL/HDL Ratio: 3
Triglycerides: 79 mg/dL (ref 0.0–149.0)
VLDL: 15.8 mg/dL (ref 0.0–40.0)

## 2015-03-25 LAB — TSH: TSH: 1.11 u[IU]/mL (ref 0.35–4.50)

## 2015-03-25 LAB — PSA: PSA: 5.86 ng/mL — AB (ref 0.10–4.00)

## 2015-04-02 ENCOUNTER — Encounter: Payer: Self-pay | Admitting: Adult Health

## 2015-04-02 ENCOUNTER — Ambulatory Visit (INDEPENDENT_AMBULATORY_CARE_PROVIDER_SITE_OTHER): Payer: Federal, State, Local not specified - PPO | Admitting: Adult Health

## 2015-04-02 VITALS — BP 102/78 | Temp 98.2°F | Ht 71.5 in | Wt 202.6 lb

## 2015-04-02 DIAGNOSIS — H6123 Impacted cerumen, bilateral: Secondary | ICD-10-CM

## 2015-04-02 DIAGNOSIS — Z0000 Encounter for general adult medical examination without abnormal findings: Secondary | ICD-10-CM

## 2015-04-02 DIAGNOSIS — R972 Elevated prostate specific antigen [PSA]: Secondary | ICD-10-CM

## 2015-04-02 NOTE — Patient Instructions (Signed)
It was great meeting you today! Please follow up in 1-2 years for your next complete physical exam. Someone will call you to schedule your exam with urology. Please let me know if you need anything.

## 2015-04-02 NOTE — Progress Notes (Signed)
Subjective:    Patient ID: Adrian [name], male    DOB: 27-Aug-1961, 54 y.o.   MRN: [id number]  HPI Kalel is a 54 year old married male nonsmoker who works at the post [employer], he comes in to the office today for his  general physical examination.  Denies any interval history  He's always been in excellent health he said no chronic health problems nor does he take a medication on a daily basis.  He gets routine eye care, dental care, colonoscopy at age 54 normal, tetanus booster 2008.  We reviewed labs in detail. His PSA continues to be elevated. When he asked about nocturia he does endorse it and states " I just chalked that up to old age." He denies any abdominal pain or rectal pain.     Review of Systems  Constitutional: Negative.   HENT: Negative.   Eyes: Negative.   Respiratory: Negative.   Cardiovascular: Negative.   Gastrointestinal: Negative.   Endocrine: Negative.   Genitourinary: Positive for urgency and decreased urine volume.  Musculoskeletal: Positive for arthralgias.  Skin: Negative.   Neurological: Negative.   Hematological: Negative.   Psychiatric/Behavioral: Negative.    Past Medical History  Diagnosis Date  . Plantar fasciitis of left foot 05/2013  . History of kidney stones   . Dental crowns present     History   Social History  . Marital Status: Married    Spouse Name: N/A  . Number of Children: N/A  . Years of Education: N/A   Occupational History  . Not on file.   Social History Main Topics  . Smoking status: Never Smoker   . Smokeless tobacco: Never Used  . Alcohol Use: No  . Drug Use: No  . Sexual Activity: Not on file   Other Topics Concern  . Not on file   Social History Narrative    Past Surgical History  Procedure Laterality Date  . Knee arthroscopy Left   . Lithotripsy    . Mandible surgery    . Plantar fascia release  06/28/2012    Procedure: ENDOSCOPIC PLANTAR FASCIOTOMY;  Surgeon: Ninetta Lights, MD;  Location: [facility];  Service: Orthopedics;  Laterality: Right;  . Breast enhancement surgery Left     asymmetry of chest wall  . Closed reduction metacarpal with percutaneous pinning Right 12/22/2004    5th metacarpal  . Plantar fascia release Left 06/13/2013    Procedure: ENDOSCOPIC PLANTAR FASCIOTOMY;  Surgeon: Ninetta Lights, MD;  Location: [facility];  Service: Orthopedics;  Laterality: Left;    No family history on file.  Allergies  Allergen Reactions  . Oxycodone Nausea And Vomiting  . Hydrocodone     vomiting    No current outpatient prescriptions on file prior to visit.   No current facility-administered medications on file prior to visit.    BP 102/78 mmHg  Temp(Src) 98.2 F (36.8 C) (Oral)  Ht 5' 11.5" (1.816 m)  Wt 202 lb 9.6 oz (91.899 kg)  BMI 27.87 kg/m2       Objective:   Physical Exam  Constitutional: He is oriented to person, place, and time. He appears well-developed and well-nourished. No distress.  HENT:  Head: Normocephalic and atraumatic.  Right Ear: External ear normal.  Left Ear: External ear normal.  Nose: Nose normal.  Mouth/Throat: Oropharynx is clear and moist. No oropharyngeal exudate.  TM's not visualized due to cerumen impaction.   Eyes: Conjunctivae and EOM are normal. Pupils are  equal, round, and reactive to light. Right eye exhibits no discharge. Left eye exhibits no discharge. No scleral icterus.  Neck: Normal range of motion. Neck supple. No thyromegaly present.  Cardiovascular: Normal rate, regular rhythm, normal heart sounds and intact distal pulses.  Exam reveals no gallop and no friction rub.   No murmur heard. Pulmonary/Chest: Effort normal. No respiratory distress. He has no wheezes. He has no rales. He exhibits no tenderness.  Abdominal: Soft. Bowel sounds are normal. He exhibits no distension and no mass. There is no tenderness. There is no rebound and no guarding.  Genitourinary: Guaiac negative stool.    Prostate slightly enlarged but firm. No nodules or masses felt.   Musculoskeletal: Normal range of motion. He exhibits no edema or tenderness.  Lymphadenopathy:    He has no cervical adenopathy.  Neurological: He is alert and oriented to person, place, and time.  Skin: Skin is warm and dry. No rash noted. He is not diaphoretic. No erythema. No pallor.  Psychiatric: He has a normal mood and affect. His behavior is normal. Judgment and thought content normal.  Nursing note and vitals reviewed.      Assessment & Plan:  1. Routine general medical examination at a health care facility - Reviewed labs in detail.  - Continue to exercise and eat healthy - Follow up in 1-2 years for CPE - Follow up as needed  2. Elevated PSA - Ambulatory referral to Urology - We talked about starting Flomax for BPH. He would like to see urology before starting medication.  3. Cerumen impaction, bilateral - Ears irrigated and cerumen was dislodged.

## 2016-01-23 ENCOUNTER — Emergency Department (HOSPITAL_BASED_OUTPATIENT_CLINIC_OR_DEPARTMENT_OTHER)
Admission: EM | Admit: 2016-01-23 | Discharge: 2016-01-23 | Disposition: A | Discharge status: Home / Self Care | Payer: Federal, State, Local not specified - PPO | Attending: Emergency Medicine | Admitting: Emergency Medicine

## 2016-01-23 ENCOUNTER — Encounter (HOSPITAL_BASED_OUTPATIENT_CLINIC_OR_DEPARTMENT_OTHER): Payer: Self-pay

## 2016-01-23 ENCOUNTER — Emergency Department (HOSPITAL_BASED_OUTPATIENT_CLINIC_OR_DEPARTMENT_OTHER): Payer: Federal, State, Local not specified - PPO

## 2016-01-23 DIAGNOSIS — M25562 Pain in left knee: Secondary | ICD-10-CM | POA: Diagnosis not present

## 2016-01-23 MED ORDER — IBUPROFEN 800 MG PO TABS: 800.0000 mg | ORAL_TABLET | Freq: Three times a day (TID) | ORAL | Status: DC

## 2016-01-23 NOTE — Discharge Instructions (Signed)
Wear knee sleeve. Ice and elevate knee throughout the day. Ibuprofen for pain relief as needed. Call orthopedic clinic Monday if symptoms persist to schedule follow up appointment.

## 2016-01-23 NOTE — ED Notes (Signed)
Reports left knee injury after was carrying and amp and it slipped out of hand and caught the amp with his left knee.  Complains of pain with extension and flexion but sore when he starts to walk but gets better. Complains of left knee swelling.

## 2016-01-24 NOTE — ED Provider Notes (Signed)
CSN: [id number]     Arrival date & time 01/23/16  2048 History   First MD Initiated Contact with Patient 01/23/16 2148     Chief Complaint  Patient presents with  . Knee Injury    (Consider location/radiation/quality/duration/timing/severity/associated sxs/prior Treatment) The history is provided by the patient and medical records. No language interpreter was used.   OBIE SEGALL is a 55 y.o. male  who presents to the Emergency Department complaining of Left knee pain 3 days. Patient states he was carrying a stereo and when it slipped out of his hand. He brought his left knee up so that it would not fall and he hit the proximal aspect of his knee. Positive swelling. He iced knee over the last few days and swelling has improved, but not resolved. He says that knee feels stiff, but not unstable. Pain is worst when he first stands up, and improves as he begins walking. He is able to ambulate without any difficulty. He has had a prior arthroscopy several years ago on this knee, but is unsure of what ligaments were damaged/repaired. No medications taken prior to arrival. 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    Past Medical History  Diagnosis Date  . Plantar fasciitis of left foot 05/2013  . History of kidney stones   . Dental crowns present    Past Surgical History  Procedure Laterality Date  . Knee arthroscopy Left   . Lithotripsy    . Mandible surgery    . Plantar fascia release  06/28/2012    Procedure: ENDOSCOPIC PLANTAR FASCIOTOMY;  Surgeon: Ninetta Lights, MD;  Location: [facility];  Service: Orthopedics;  Laterality: Right;  . Breast enhancement surgery Left     asymmetry of chest wall  . Closed reduction metacarpal with percutaneous pinning Right 12/22/2004    5th metacarpal  . Plantar fascia release Left 06/13/2013    Procedure: ENDOSCOPIC PLANTAR FASCIOTOMY;  Surgeon: Ninetta Lights, MD;  Location: [facility];  Service:  Orthopedics;  Laterality: Left;   No family history on file. Social History  Substance Use Topics  . Smoking status: Never Smoker   . Smokeless tobacco: Never Used  . Alcohol Use: No    Review of Systems  Constitutional: Negative for fever.  HENT: Negative for congestion.   Respiratory: Negative for shortness of breath.   Cardiovascular: Negative for chest pain.  Gastrointestinal: Negative for abdominal pain.  Musculoskeletal: Positive for joint swelling and arthralgias.  Skin: Negative for color change.  Allergic/Immunologic: Negative for immunocompromised state.  Neurological: Negative for numbness.  Hematological: Does not bruise/bleed easily.      Allergies  Oxycodone and Hydrocodone  Home Medications   Prior to Admission medications   Medication Sig Start Date End Date Taking? Authorizing Provider  ibuprofen (ADVIL,MOTRIN) 800 MG tablet Take 1 tablet (800 mg total) by mouth 3 (three) times daily. 01/23/16   Lileigh Fahringer Pilcher Regie Bunner, PA-C   BP 134/87 mmHg  Pulse 65  Temp(Src) 98 F (36.7 C) (Oral)  Resp 18  Ht 5\' 11"  (1.803 m)  Wt 86.183 kg  BMI 26.51 kg/m2  SpO2 96% Physical Exam  Constitutional: He is oriented to person, place, and time. He appears well-developed and well-nourished.  Alert and in no acute distress  HENT:  Head: Normocephalic and atraumatic.  Cardiovascular: Normal rate, regular rhythm and normal heart sounds.   Pulmonary/Chest: Effort normal and breath sounds normal. No respiratory distress.  Abdominal: Soft.  He exhibits no distension. There is no tenderness.  Musculoskeletal:  Left knee: + swelling. Knee with full ROM. No joint line or bony TTP. No abnormal alignment or patellar mobility. No bruising, erythema, or warmth overlaying the joint. No varus/valgus laxity. Negative drawer's, negative Lachman's, negative McMurray's, no crepitus.  2+ DP pulse's bilaterally. All compartments are soft. Sensation intact distal to injury.  Neurological: He is  alert and oriented to person, place, and time.  Skin: Skin is warm and dry.  Nursing note and vitals reviewed.   ED Course  Procedures (including critical care time) Labs Review Labs Reviewed - No data to display  Imaging Review Dg Knee Complete 4 Views Left  01/23/2016  CLINICAL DATA:  Patient reports he dropped a heavy object on top of his bent left knee at distal femur. Patient reports he noticed swelling and a decrease in ROM the next day. Patient reports knee arthroscopy in past. EXAM: LEFT KNEE - COMPLETE 4+ VIEW COMPARISON:  None. FINDINGS: Medial and patellofemoral joint space narrowing noted. Small to moderate joint effusion is present. No acute fracture or subluxation identified. IMPRESSION: 1. Degenerative changes. 2. Joint effusion. Electronically Signed   By: Nolon Nations M.D.   On: 01/23/2016 21:17   I have personally reviewed and evaluated these images and lab results as part of my medical decision-making.   EKG Interpretation None      MDM   Final diagnoses:  Knee pain, acute, left   CHRISTY ARQUILLA presents to ED for left knee pain. On exam, + swelling. Knee stable and ligaments appear to be intact. NVI. X-ray was obtained which shows small to moderate joint effusion and degenerative changes, but no fracture or sublux identified. [facility] home care discussed, knee sleeve provided. Ortho follow up discussed. All questions answered.    [facility] Raun Routh, PA-C 01/24/16 [id number]  Charlesetta Shanks, MD 01/30/16 [phone]

## 2016-03-11 ENCOUNTER — Encounter: Payer: Self-pay | Admitting: Family Medicine

## 2016-03-11 ENCOUNTER — Ambulatory Visit (INDEPENDENT_AMBULATORY_CARE_PROVIDER_SITE_OTHER): Payer: Federal, State, Local not specified - PPO | Admitting: Family Medicine

## 2016-03-11 VITALS — BP 134/80 | HR 55 | Temp 98.0°F | Resp 12 | Ht 71.0 in | Wt 200.0 lb

## 2016-03-11 DIAGNOSIS — R0683 Snoring: Secondary | ICD-10-CM

## 2016-03-11 NOTE — Progress Notes (Signed)
Pre visit review using our clinic review tool, if applicable. No additional management support is needed unless otherwise documented below in the visit note. 

## 2016-03-11 NOTE — Patient Instructions (Signed)
A few things to remember from today's visit:   1. Snoring OTC nose strips may help. ENT can be considered. I could do some research and let you know about options.      If you sign-up for My chart, you can communicate easier with [place] in case you have any question or concern.

## 2016-03-11 NOTE — Progress Notes (Signed)
HPI:  ACUTE VISIT:  Adrian [name] is a 55 y.o. male, who is here today complaining of loud snoring.  According to patient he has always snored, but his wife has complained about his snoring getting louder. No reported episodes of apnea. He sleeps about 6 hours, he feels rested the next day. His snoring is not waking him up. He has not noted nasal congestion, postnasal drainage,or  mouth breathing.  He has not tried any over-the-counter treatment, he would like to know which device or treatments are recommended for loud snoring.  In general, snoring is not bother him but this keeping his wife from sleeping, she is wearing earplugs.   Review of Systems  Constitutional: Negative for fever, activity change, appetite change, fatigue and unexpected weight change.  HENT: Negative for congestion, dental problem, nosebleeds, postnasal drip, rhinorrhea, sore throat, trouble swallowing and voice change.   Eyes: Negative for redness and visual disturbance.  Respiratory: Negative for apnea, cough, shortness of breath, wheezing and stridor.   Gastrointestinal: Negative for nausea, vomiting and abdominal pain.       No changes in bowel habits  Genitourinary: Negative for dysuria, hematuria and decreased urine volume.  Musculoskeletal: Negative for myalgias, back pain and arthralgias.  Allergic/Immunologic: Negative for environmental allergies.  Neurological: Negative for syncope, weakness and headaches.  Hematological: Negative for adenopathy.  Psychiatric/Behavioral: Negative for confusion and sleep disturbance.      Current Outpatient Prescriptions on File Prior to Visit  Medication Sig Dispense Refill  . ibuprofen (ADVIL,MOTRIN) 800 MG tablet Take 1 tablet (800 mg total) by mouth 3 (three) times daily. 21 tablet 0   No current facility-administered medications on file prior to visit.     Past Medical History  Diagnosis Date  . Plantar fasciitis of left foot 05/2013  . History  of kidney stones   . Dental crowns present    Allergies  Allergen Reactions  . Oxycodone Nausea And Vomiting  . Hydrocodone     vomiting    Social History   Social History  . Marital Status: Married    Spouse Name: N/A  . Number of Children: N/A  . Years of Education: N/A   Social History Main Topics  . Smoking status: Never Smoker   . Smokeless tobacco: Never Used  . Alcohol Use: No  . Drug Use: No  . Sexual Activity: Not Asked   Other Topics Concern  . None   Social History Narrative    Filed Vitals:   03/11/16 1026  BP: 134/80  Pulse: 55  Temp: 98 F (36.7 C)  Resp: 12   Body mass index is 27.91 kg/(m^2).  SpO2 Readings from Last 3 Encounters:  03/11/16 98%  01/23/16 96%  07/13/13 96%     Physical Exam  Constitutional: He is oriented to person, place, and time. He appears well-developed. No distress.  HENT:  Head: Atraumatic.  Nose: Septal deviation present. No mucosal edema or rhinorrhea.  Mouth/Throat: Uvula is midline, oropharynx is clear and moist and mucous membranes are normal.  Eyes: Conjunctivae are normal.  Cardiovascular: Normal rate and regular rhythm.   No murmur heard. HR by my count 60/min.  Respiratory: Effort normal and breath sounds normal. No stridor. No respiratory distress.  Musculoskeletal: He exhibits no edema or tenderness.  Lymphadenopathy:    He has no cervical adenopathy.  Neurological: He is alert and oriented to person, place, and time. He has normal strength. Gait normal.  Skin: Skin is warm.  No erythema.  Psychiatric: He has a normal mood and affect.  Well groomed, good eye contact.      ASSESSMENT AND PLAN:     Adrian [name] was seen today for snoring.  Diagnoses and all orders for this visit:  Snoring   Reporting to be louder but it doesn't seem to have any physical impact on patient;mainly is here because his wife is having sleep disturbances due to his snoring. We discussed some options, there are some  mouthpieces that he could try but he is reporting hypersensitive gag reflex so this might not be a good option for him. He could try nasal strips, even though he doesn't seem to have any nasal congestion or obstruction. We could also arrange an appointment with ENT, I'm not sure if this any procedure available for loud snoring but correction of septal deviation may help. He doesn't seem to have any OSA -like symptoms. Wt loss may also help. Try to sleep on the side instead on his back. He agrees with trying nasal strips and prefers to hold on ENT referral. I will give further recommendations from my chart after by her some research myself.        -Adrian [name] advised to return or notify a doctor immediately if symptoms worsen or persist or new concerns arise.       Betty G. Martinique, MD  [facility] [place] office.

## 2016-03-22 ENCOUNTER — Encounter: Payer: Self-pay | Admitting: Family Medicine

## 2016-08-16 DIAGNOSIS — J209 Acute bronchitis, unspecified: Secondary | ICD-10-CM | POA: Diagnosis not present

## 2016-09-21 DIAGNOSIS — K591 Functional diarrhea: Secondary | ICD-10-CM | POA: Diagnosis not present

## 2016-10-11 DIAGNOSIS — M25562 Pain in left knee: Secondary | ICD-10-CM | POA: Diagnosis not present

## 2016-10-11 DIAGNOSIS — M25561 Pain in right knee: Secondary | ICD-10-CM | POA: Diagnosis not present

## 2017-06-02 ENCOUNTER — Encounter: Payer: Self-pay | Admitting: Family Medicine

## 2017-06-28 DIAGNOSIS — J069 Acute upper respiratory infection, unspecified: Secondary | ICD-10-CM | POA: Diagnosis not present

## 2017-06-28 DIAGNOSIS — R05 Cough: Secondary | ICD-10-CM | POA: Diagnosis not present

## 2017-08-22 DIAGNOSIS — S39012A Strain of muscle, fascia and tendon of lower back, initial encounter: Secondary | ICD-10-CM | POA: Diagnosis not present

## 2017-10-16 ENCOUNTER — Encounter (HOSPITAL_BASED_OUTPATIENT_CLINIC_OR_DEPARTMENT_OTHER): Payer: Self-pay | Admitting: Respiratory Therapy

## 2017-10-16 ENCOUNTER — Emergency Department (HOSPITAL_BASED_OUTPATIENT_CLINIC_OR_DEPARTMENT_OTHER)
Admission: EM | Admit: 2017-10-16 | Discharge: 2017-10-16 | Disposition: A | Discharge status: Home / Self Care | Payer: Federal, State, Local not specified - PPO | Attending: Emergency Medicine | Admitting: Emergency Medicine

## 2017-10-16 ENCOUNTER — Emergency Department (HOSPITAL_BASED_OUTPATIENT_CLINIC_OR_DEPARTMENT_OTHER): Payer: Federal, State, Local not specified - PPO

## 2017-10-16 ENCOUNTER — Other Ambulatory Visit: Payer: Self-pay

## 2017-10-16 DIAGNOSIS — R112 Nausea with vomiting, unspecified: Secondary | ICD-10-CM | POA: Diagnosis not present

## 2017-10-16 DIAGNOSIS — R1110 Vomiting, unspecified: Secondary | ICD-10-CM | POA: Diagnosis not present

## 2017-10-16 DIAGNOSIS — R509 Fever, unspecified: Secondary | ICD-10-CM | POA: Insufficient documentation

## 2017-10-16 DIAGNOSIS — J069 Acute upper respiratory infection, unspecified: Secondary | ICD-10-CM | POA: Insufficient documentation

## 2017-10-16 DIAGNOSIS — J111 Influenza due to unidentified influenza virus with other respiratory manifestations: Secondary | ICD-10-CM | POA: Diagnosis not present

## 2017-10-16 DIAGNOSIS — R197 Diarrhea, unspecified: Secondary | ICD-10-CM | POA: Diagnosis not present

## 2017-10-16 DIAGNOSIS — B9789 Other viral agents as the cause of diseases classified elsewhere: Secondary | ICD-10-CM | POA: Diagnosis not present

## 2017-10-16 DIAGNOSIS — R05 Cough: Secondary | ICD-10-CM | POA: Diagnosis not present

## 2017-10-16 MED ORDER — ACETAMINOPHEN 325 MG PO TABS
650.0000 mg | ORAL_TABLET | Freq: Once | ORAL | Status: AC
  Administered 2017-10-16: 650 mg via ORAL
  Filled 2017-10-16: qty 2

## 2017-10-16 NOTE — ED Provider Notes (Signed)
[facility] EMERGENCY DEPARTMENT Provider Note   CSN: [id number] Arrival date & time: 10/16/17  5809     History   Chief Complaint Chief Complaint  Patient presents with  . Cough  . Emesis    HPI Adrian [name] is a 57 y.o. male.  Patient is a 57 year old male presenting with 3 days of nonproductive cough with posttussive emesis, fevers, and one day of diarrhea.  PMH is unremarkable.  Patient endorsing progressive nonproductive cough over the past 3 days.  He endorses swallowing his sputum which he believes is causing his nausea.  He also endorses posttussive NBNB emesis.  He has been experiencing fevers and chills over the past few days without recorded temperature.  He also endorses new onset diarrhea as of this morning.  Patient denies chest pain, shortness of breath, abdominal pain, joint or body aches.  He denies receiving annual flu shot.  Patient is a non-smoker, denies EtOH and illicit drug use.      Past Medical History:  Diagnosis Date  . Dental crowns present   . History of kidney stones   . Plantar fasciitis of left foot 05/2013    Patient Active Problem List   Diagnosis Date Noted  . Routine general medical examination at a health care facility 12/16/2013  . Elevated PSA 12/16/2013  . Nephrolithiasis 07/13/2013  . HEMATURIA, HX OF 03/24/2008  . BICIPITAL TENOSYNOVITIS 09/27/2007    Past Surgical History:  Procedure Laterality Date  . BREAST ENHANCEMENT SURGERY Left    asymmetry of chest wall  . CLOSED REDUCTION METACARPAL WITH PERCUTANEOUS PINNING Right 12/22/2004   5th metacarpal  . KNEE ARTHROSCOPY Left   . LITHOTRIPSY    . MANDIBLE SURGERY    . PLANTAR FASCIA RELEASE  06/28/2012   Procedure: ENDOSCOPIC PLANTAR FASCIOTOMY;  Surgeon: Ninetta Lights, MD;  Location: [facility];  Service: Orthopedics;  Laterality: Right;  . PLANTAR FASCIA RELEASE Left 06/13/2013   Procedure: ENDOSCOPIC PLANTAR FASCIOTOMY;  Surgeon: Ninetta Lights,  MD;  Location: [facility];  Service: Orthopedics;  Laterality: Left;       Home Medications    Prior to Admission medications   Not on File    Family History No family history on file.  Social History Social History   Tobacco Use  . Smoking status: Never Smoker  . Smokeless tobacco: Never Used  Substance Use Topics  . Alcohol use: No  . Drug use: No     Allergies   Oxycodone and Hydrocodone   Review of Systems Review of Systems  Constitutional: Positive for chills, diaphoresis and fever. Negative for fatigue.  HENT: Negative for congestion, rhinorrhea and sore throat.   Eyes: Negative for photophobia and visual disturbance.  Respiratory: Positive for cough. Negative for shortness of breath and wheezing.   Cardiovascular: Negative for chest pain, palpitations and leg swelling.  Gastrointestinal: Positive for diarrhea, nausea and vomiting. Negative for abdominal pain and constipation.  Genitourinary: Negative for dysuria and flank pain.  Musculoskeletal: Negative for arthralgias and back pain.  Skin: Negative for pallor and rash.  Neurological: Negative for dizziness and headaches.  Psychiatric/Behavioral: Negative for confusion. The patient is not nervous/anxious.      Physical Exam Updated Vital Signs BP (!) 123/99 (BP Location: Left Arm)   Pulse (!) 109   Temp 99.8 F (37.7 C) (Oral)   Resp 18   Ht 5\' 11"  (1.803 m)   Wt 90.7 kg (200 lb)   SpO2  96%   BMI 27.89 kg/m   Physical Exam  Constitutional: He is oriented to person, place, and time. He appears well-developed and well-nourished. No distress.  HENT:  Head: Normocephalic and atraumatic.  Right Ear: External ear normal.  Left Ear: External ear normal.  Nose: Nose normal.  Mouth/Throat: Oropharynx is clear and moist.  Eyes: Conjunctivae and EOM are normal. Pupils are equal, round, and reactive to light.  Neck: Normal range of motion. Neck supple.  Cardiovascular: Normal rate,  regular rhythm, normal heart sounds and intact distal pulses. Exam reveals no gallop and no friction rub.  No murmur heard. Pulmonary/Chest: Effort normal and breath sounds normal. No stridor. No respiratory distress. He has no wheezes. He has no rales.  Abdominal: Soft. Bowel sounds are normal. He exhibits no distension. There is no tenderness.  Musculoskeletal: Normal range of motion. He exhibits no edema.  Neurological: He is alert and oriented to person, place, and time.  Skin: Skin is warm. Capillary refill takes less than 2 seconds. No rash noted. He is diaphoretic. No erythema. No pallor.  Psychiatric: He has a normal mood and affect. His behavior is normal.     ED Treatments / Results  Labs (all labs ordered are listed, but only abnormal results are displayed) Labs Reviewed - No data to display  EKG  EKG Interpretation None       Radiology Dg Chest 2 View  Result Date: 10/16/2017 CLINICAL DATA:  Flu-like symptoms for 2 days EXAM: CHEST  2 VIEW COMPARISON:  03/03/2005 FINDINGS: Cardiac shadow is within normal limits. The lungs are well aerated bilaterally. No focal infiltrate or sizable effusion is seen. Mild degenerative change of the thoracic spine is noted. IMPRESSION: No active cardiopulmonary disease. Electronically Signed   By: Inez Catalina M.D.   On: 10/16/2017 08:36    Procedures Procedures (including critical care time)  Medications Ordered in ED Medications  acetaminophen (TYLENOL) tablet 650 mg (650 mg Oral Given 10/16/17 0835)     Initial Impression / Assessment and Plan / ED Course  I have reviewed the triage vital signs and the nursing notes.  Pertinent labs & imaging results that were available during my care of the patient were reviewed by me and considered in my medical decision making (see chart for details).    Patient is a 57 year old male presenting with 3 days of nonproductive cough with posttussive emesis, fevers, and one day of diarrhea.  PMH is  unremarkable.  Patient presenting with 3 days of viral-like symptoms including fevers and chills, nonproductive cough with posttussive emesis, and new onset diarrhea.  On exam's appear to be clear bilaterally and patient without increased work of breathing satting 96% on room air.  Vital signs significant for fever 101.50F, tachycardia 109, normotensive at 123/99.  Patient given Tylenol 650 mg by mouth x1.  CXR 2 view unremarkable.  Fever now resolved at 99.41F.  It is likely related to a viral illness.  Instructed patient to continue Tylenol for fever and NSAIDs if needed.  Recommended Robitussin or honey for cough.  Discussed with patient's return precautions and symptoms concerning for pneumonia.  Advised patient to schedule follow-up appointment with PCP.  Final Clinical Impressions(s) / ED Diagnoses   Final diagnoses:  Viral URI with cough    ED Discharge Orders    None        Bing, DO 10/16/17 0174    Jola Schmidt, MD 10/16/17 [phone]

## 2017-10-16 NOTE — Discharge Instructions (Signed)
Continue taking Robitussin or honey 3 times daily for cough.  You can take Tylenol or NSAIDs such as ibuprofen for fevers.  I would anticipate your symptoms improving over the next week.  I would advise you to schedule an appointment with your primary care doctor and receive the flu shot.

## 2017-10-16 NOTE — ED Triage Notes (Signed)
Cough for 2 days.  Productive.  Pt was initially having vomiting with cough.  Now having vomiting x 2 with diarrhea.x 1.  Fever at home.

## 2017-11-20 DIAGNOSIS — M7542 Impingement syndrome of left shoulder: Secondary | ICD-10-CM | POA: Insufficient documentation

## 2017-11-20 DIAGNOSIS — M25512 Pain in left shoulder: Secondary | ICD-10-CM | POA: Diagnosis not present

## 2017-11-20 DIAGNOSIS — M25561 Pain in right knee: Secondary | ICD-10-CM | POA: Insufficient documentation

## 2017-12-27 DIAGNOSIS — M7542 Impingement syndrome of left shoulder: Secondary | ICD-10-CM | POA: Diagnosis not present

## 2018-01-12 DIAGNOSIS — M1711 Unilateral primary osteoarthritis, right knee: Secondary | ICD-10-CM | POA: Diagnosis not present

## 2018-01-12 DIAGNOSIS — M170 Bilateral primary osteoarthritis of knee: Secondary | ICD-10-CM | POA: Diagnosis not present

## 2018-01-12 DIAGNOSIS — M25561 Pain in right knee: Secondary | ICD-10-CM | POA: Diagnosis not present

## 2018-06-13 ENCOUNTER — Encounter: Payer: Self-pay | Admitting: Family Medicine

## 2018-06-13 ENCOUNTER — Ambulatory Visit (INDEPENDENT_AMBULATORY_CARE_PROVIDER_SITE_OTHER): Payer: Federal, State, Local not specified - PPO | Admitting: Family Medicine

## 2018-06-13 VITALS — BP 100/68 | HR 77 | Temp 98.2°F | Wt 197.1 lb

## 2018-06-13 DIAGNOSIS — R972 Elevated prostate specific antigen [PSA]: Secondary | ICD-10-CM | POA: Diagnosis not present

## 2018-06-13 DIAGNOSIS — Z136 Encounter for screening for cardiovascular disorders: Secondary | ICD-10-CM | POA: Diagnosis not present

## 2018-06-13 DIAGNOSIS — Z23 Encounter for immunization: Secondary | ICD-10-CM | POA: Diagnosis not present

## 2018-06-13 DIAGNOSIS — Z0000 Encounter for general adult medical examination without abnormal findings: Secondary | ICD-10-CM | POA: Diagnosis not present

## 2018-06-13 LAB — LIPID PANEL
CHOLESTEROL: 158 mg/dL (ref 0–200)
HDL: 52.2 mg/dL (ref >39.00)
LDL CALC: 90 mg/dL (ref 0–99)
NonHDL: 105.77
TRIGLYCERIDES: 78 mg/dL (ref 0.0–149.0)
Total CHOL/HDL Ratio: 3
VLDL: 15.6 mg/dL (ref 0.0–40.0)

## 2018-06-13 LAB — POCT URINALYSIS DIPSTICK
Bilirubin, UA: NEGATIVE
Blood, UA: POSITIVE
Glucose, UA: NEGATIVE
Ketones, UA: NEGATIVE
LEUKOCYTES UA: NEGATIVE
Nitrite, UA: NEGATIVE
PH UA: 6 (ref 5.0–8.0)
Protein, UA: POSITIVE — AB
Spec Grav, UA: >=1.03 — AB (ref 1.010–1.025)
Urobilinogen, UA: 0.2 E.U./dL

## 2018-06-13 LAB — BASIC METABOLIC PANEL
BUN: 19 mg/dL (ref 6–23)
CALCIUM: 9.3 mg/dL (ref 8.4–10.5)
CO2: 25 mEq/L (ref 19–32)
Chloride: 107 mEq/L (ref 96–112)
Creatinine, Ser: 1.2 mg/dL (ref 0.40–1.50)
GFR: 65.91 mL/min (ref >60.00)
Glucose, Bld: 95 mg/dL (ref 70–99)
Potassium: 4.2 mEq/L (ref 3.5–5.1)
SODIUM: 140 meq/L (ref 135–145)

## 2018-06-13 LAB — CBC WITH DIFFERENTIAL/PLATELET
BASOS PCT: 1 % (ref 0.0–3.0)
Basophils Absolute: 0.1 10*3/uL (ref 0.0–0.1)
EOS PCT: 2.5 % (ref 0.0–5.0)
Eosinophils Absolute: 0.2 10*3/uL (ref 0.0–0.7)
HEMATOCRIT: 46.9 % (ref 39.0–52.0)
HEMOGLOBIN: 16.2 g/dL (ref 13.0–17.0)
Lymphocytes Relative: 27.5 % (ref 12.0–46.0)
Lymphs Abs: 1.8 10*3/uL (ref 0.7–4.0)
MCHC: 34.6 g/dL (ref 30.0–36.0)
MCV: 86.5 fl (ref 78.0–100.0)
Monocytes Absolute: 0.7 10*3/uL (ref 0.1–1.0)
Monocytes Relative: 10 % (ref 3.0–12.0)
Neutro Abs: 3.9 10*3/uL (ref 1.4–7.7)
Neutrophils Relative %: 59 % (ref 43.0–77.0)
Platelets: 246 10*3/uL (ref 150.0–400.0)
RBC: 5.42 Mil/uL (ref 4.22–5.81)
RDW: 13 % (ref 11.5–15.5)
WBC: 6.6 10*3/uL (ref 4.0–10.5)

## 2018-06-13 LAB — HEPATIC FUNCTION PANEL
ALBUMIN: 4.1 g/dL (ref 3.5–5.2)
ALT: 12 U/L (ref 0–53)
AST: 16 U/L (ref 0–37)
Alkaline Phosphatase: 69 U/L (ref 39–117)
BILIRUBIN TOTAL: 0.6 mg/dL (ref 0.2–1.2)
Bilirubin, Direct: 0.1 mg/dL (ref 0.0–0.3)
Total Protein: 6.8 g/dL (ref 6.0–8.3)

## 2018-06-13 LAB — TSH: TSH: 0.93 u[IU]/mL (ref 0.35–4.50)

## 2018-06-13 LAB — PSA: PSA: 7.93 ng/mL — AB (ref 0.10–4.00)

## 2018-06-13 NOTE — Progress Notes (Signed)
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  He takes no medications except for OTC Motrin for soreness in his knees and shoulder.  He has had surgery in his left knee.  He is also having difficulty with his right knee.  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  He now has a new route really walks 2 hours a day.  He is been seen in orthopedics by Dr. Maxie Better.  Advised him to continue that relationship  Recommend routine eye care via Dr. Bing Plume.  He does get regular dental care....... upper denture..... Colonoscopy in 2012 was normal.  Advised to come back in 10 years  Vaccination seasonal flu shot and tetanus booster given today  Family history dad died in his 64s of lung cancer mother is 62 alive and well.  A sister died of the flu.  5 brothers one died of lung cancer was a smoker.  Other 4 brothers are in good health.  14 point review of system reviewed otherwise negative  BP 100/68 (BP Location: Right Arm, Patient Position: Sitting, Cuff Size: Large)   Pulse 77   Temp 98.2 F (36.8 C) (Oral)   Wt 197 lb 1.6 oz (89.4 kg)   SpO2 97%   BMI 27.49 kg/m  Well-developed well-nourished male no acute distress vital signs stable he is afebrile examination of the head eyes ears nose and throat were negative neck was supple thyroid is not enlarged no carotid bruits.  Cardiopulmonary exam normal.  Abdominal exam normal.  Genitalia normal circumcised male.  Rectal normal stool guaiac negative prostate smooth nonnodular 1+ BPH.  Extremities normal skin normal peripheral pulses normal except for swelling of his left knee.  1.  Healthy male  2.  Osteoarthritis right and left knees....... followed by orthopedics.

## 2018-06-13 NOTE — Patient Instructions (Signed)
Labs today........[name] we will call if there is anything abnormal  I would recommend Motrin 400 mg twice daily, elevation and ice in the evening for your sore knees.  Also continue follow-up with Dr. Lindell Noe as outlined.  Recommend Dr. Bing Plume and group for eye exam.  Follow-up in 1 year for general medical exam sooner if any problems............... Adrian [name]

## 2018-06-18 ENCOUNTER — Other Ambulatory Visit: Payer: Self-pay | Admitting: Family Medicine

## 2018-06-18 DIAGNOSIS — H81399 Other peripheral vertigo, unspecified ear: Secondary | ICD-10-CM | POA: Diagnosis not present

## 2018-06-18 DIAGNOSIS — R972 Elevated prostate specific antigen [PSA]: Secondary | ICD-10-CM

## 2018-08-28 DIAGNOSIS — R972 Elevated prostate specific antigen [PSA]: Secondary | ICD-10-CM | POA: Diagnosis not present

## 2018-09-20 ENCOUNTER — Encounter: Payer: Self-pay | Admitting: Internal Medicine

## 2018-09-20 ENCOUNTER — Ambulatory Visit (INDEPENDENT_AMBULATORY_CARE_PROVIDER_SITE_OTHER): Payer: Federal, State, Local not specified - PPO | Admitting: Internal Medicine

## 2018-09-20 VITALS — BP 130/80 | HR 73 | Temp 98.2°F | Ht 70.87 in | Wt 200.7 lb

## 2018-09-20 DIAGNOSIS — L0213 Carbuncle of neck: Secondary | ICD-10-CM | POA: Diagnosis not present

## 2018-09-20 DIAGNOSIS — R972 Elevated prostate specific antigen [PSA]: Secondary | ICD-10-CM

## 2018-09-20 NOTE — Patient Instructions (Signed)
-It was nice meeting you today!  -Apply warm compresses to the back of your neck at least 1-2 times a day. Make sure you exfoliate that area while in a hot shower. It may begin to drain spontaneously. If it grows in size or you notice more redness, come back to see me.  -Schedule follow up in October 2020 for your annual physical. Please come in fasting to that appointment. Please come see [place] sooner if you have any issues with your neck carbuncle.   Skin Abscess  A skin abscess is an infected area of your skin that contains pus and other material. An abscess can happen in any part of your body. Some abscesses break open (rupture) on their own. Most continue to get worse unless they are treated. The infection can spread deeper into the body and into your blood, which can make you feel sick. A skin abscess is caused by germs that enter the skin through a cut or scrape. It can also be caused by blocked oil and sweat glands or infected hair follicles. This condition is usually treated by:  Draining the pus.  Taking antibiotic medicines.  Placing a warm, wet washcloth over the abscess. Follow these instructions at home: Medicines   Take over-the-counter and prescription medicines only as told by your doctor.  If you were prescribed an antibiotic medicine, take it as told by your doctor. Do not stop taking the antibiotic even if you start to feel better. Abscess care   If you have an abscess that has not drained, place a warm, clean, wet washcloth over the abscess several times a day. Do this as told by your doctor.  Follow instructions from your doctor about how to take care of your abscess. Make sure you: ? Cover the abscess with a bandage (dressing). ? Change your bandage or gauze as told by your doctor. ? Wash your hands with soap and water before you change the bandage or gauze. If you cannot use soap and water, use hand sanitizer.  Check your abscess every day for signs that the  infection is getting worse. Check for: ? More redness, swelling, or pain. ? More fluid or blood. ? Warmth. ? More pus or a bad smell. General instructions  To avoid spreading the infection: ? Do not share personal care items, towels, or hot tubs with others. ? Avoid making skin-to-skin contact with other people.  Keep all follow-up visits as told by your doctor. This is important. Contact a doctor if:  You have more redness, swelling, or pain around your abscess.  You have more fluid or blood coming from your abscess.  Your abscess feels warm when you touch it.  You have more pus or a bad smell coming from your abscess.  You have a fever.  Your muscles ache.  You have chills.  You feel sick. Get help right away if:  You have very bad (severe) pain.  You see red streaks on your skin spreading away from the abscess. Summary  A skin abscess is an infected area of your skin that contains pus and other material.  The abscess is caused by germs that enter the skin through a cut or scrape. It can also be caused by blocked oil and sweat glands or infected hair follicles.  Follow your doctor's instructions on caring for your abscess, taking medicines, preventing infections, and keeping follow-up visits. This information is not intended to replace advice given to you by your health care provider. Make  sure you discuss any questions you have with your health care provider. Document Released: 02/15/2008 Document Revised: 10/12/2017 Document Reviewed: 10/12/2017 Elsevier Interactive Patient Education  2019 [employer].

## 2018-09-20 NOTE — Progress Notes (Signed)
Established Patient Office Visit     CC/Reason for Visit: Establish care, follow-up chronic medical conditions, "lump in the back of my neck"  HPI: Adrian SEEVER is a 58 y.o. male who is coming in today for the above mentioned reasons.  Due for annual physical in October 2020.  Past Medical History is minimal and only significant for an elevated PSA that was discovered during his annual physical of 7.9, he has been referred to urology and is having a biopsy done on February 24.  He has noted some very mild symptoms of prostate enlargement, some very mild nocturia maybe wakes up once to twice a night, has noticed weak urine stream especially in the morning.  For the past year he has had a small lump in the back of his neck, recently has gotten a little more tender he would like me to examine that area.   Past Medical/Surgical History: Past Medical History:  Diagnosis Date  . Dental crowns present   . History of kidney stones   . Plantar fasciitis of left foot 05/2013    Past Surgical History:  Procedure Laterality Date  . BREAST ENHANCEMENT SURGERY Left    asymmetry of chest wall  . CLOSED REDUCTION METACARPAL WITH PERCUTANEOUS PINNING Right 12/22/2004   5th metacarpal  . KNEE ARTHROSCOPY Left   . LITHOTRIPSY    . MANDIBLE SURGERY    . PLANTAR FASCIA RELEASE  06/28/2012   Procedure: ENDOSCOPIC PLANTAR FASCIOTOMY;  Surgeon: Ninetta Lights, MD;  Location: [place];  Service: Orthopedics;  Laterality: Right;  . PLANTAR FASCIA RELEASE Left 06/13/2013   Procedure: ENDOSCOPIC PLANTAR FASCIOTOMY;  Surgeon: Ninetta Lights, MD;  Location: [place];  Service: Orthopedics;  Laterality: Left;    Social History:  reports that he has never smoked. He has never used smokeless tobacco. He reports that he does not drink alcohol or use drugs.  Allergies: Allergies  Allergen Reactions  . Oxycodone Nausea And Vomiting  . Hydrocodone     vomiting    Family  History:  No family history of significance including: Heart disease, stroke, hypertension, diabetes.  No current outpatient medications on file.  Review of Systems: Constitutional: Denies fever, chills, diaphoresis, appetite change and fatigue.  HEENT: Denies photophobia, eye pain, redness, hearing loss, ear pain, congestion, sore throat, rhinorrhea, sneezing, mouth sores, trouble swallowing, neck pain, neck stiffness and tinnitus.   Respiratory: Denies SOB, DOE, cough, chest tightness,  and wheezing.   Cardiovascular: Denies chest pain, palpitations and leg swelling.  Gastrointestinal: Denies nausea, vomiting, abdominal pain, diarrhea, constipation, blood in stool and abdominal distention.  Genitourinary: Denies dysuria, urgency, frequency, hematuria, flank pain and difficulty urinating.  Endocrine: Denies: hot or cold intolerance, sweats, changes in hair or nails, polyuria, polydipsia. Musculoskeletal: Denies myalgias, back pain, joint swelling, arthralgias and gait problem.Skin: Denies pallor, rash and wound.  Neurological: Denies dizziness, seizures, syncope, weakness, light-headedness, numbness and headaches.  Hematological: Denies adenopathy. Easy bruising, personal or family bleeding history  Psychiatric/Behavioral: Denies suicidal ideation, mood changes, confusion, nervousness, sleep disturbance and agitation    Physical Exam: Vitals:   09/20/18 0701  BP: 130/80  Pulse: 73  Temp: 98.2 F (36.8 C)  TempSrc: Oral  SpO2: 96%  Weight: 200 lb 11.2 oz (91 kg)  Height: 5' 10.87" (1.8 m)    Body mass index is 28.1 kg/m.    Constitutional: NAD, calm, comfortable Eyes: PERRL, lids and conjunctivae normal ENMT: Mucous membranes are  moist. Posterior pharynx clear of any exudate or lesions. Normal dentition. Tympanic membrane is pearly white, no erythema or bulging. Neck: normal, supple, no masses, no thyromegaly Respiratory: clear to auscultation bilaterally, no wheezing, no  crackles. Normal respiratory effort. No accessory muscle use.  Cardiovascular: Regular rate and rhythm, no murmurs / rubs / gallops. No extremity edema. 2+ pedal pulses. No carotid bruits.  Abdomen: no tenderness, no masses palpated. No hepatosplenomegaly. Bowel sounds positive.  Musculoskeletal: no clubbing / cyanosis. No joint deformity upper and lower extremities. Good ROM, no contractures. Normal muscle tone.  Skin: Please see below picture for area of concern on neck:      Neurologic: CN 2-12 grossly intact. Sensation intact, DTR normal. Strength 5/5 in all 4.  Psychiatric: Normal judgment and insight. Alert and oriented x 3. Normal mood.    Impression and Plan:  Elevated PSA -Follows with urology for this issue, scheduled for prostate biopsy in February.  Carbuncle, neck -This appears to be a very small skin abscess. -Have advised patient to apply warm compresses once to twice a day to the area, exfoliate the area while in the shower. -He has been advised that this may open up and start draining spontaneously. -If he notices increasing size, increased pain or increased erythema he has been advised to return to clinic and we will schedule an incision and drainage in the office.    Patient Instructions  -It was nice meeting you today!  -Apply warm compresses to the back of your neck at least 1-2 times a day. Make sure you exfoliate that area while in a hot shower. It may begin to drain spontaneously. If it grows in size or you notice more redness, come back to see me.  -Schedule follow up in October 2020 for your annual physical. Please come in fasting to that appointment. Please come see [place] sooner if you have any issues with your neck carbuncle.   Skin Abscess  A skin abscess is an infected area of your skin that contains pus and other material. An abscess can happen in any part of your body. Some abscesses break open (rupture) on their own. Most continue to get worse unless  they are treated. The infection can spread deeper into the body and into your blood, which can make you feel sick. A skin abscess is caused by germs that enter the skin through a cut or scrape. It can also be caused by blocked oil and sweat glands or infected hair follicles. This condition is usually treated by:  Draining the pus.  Taking antibiotic medicines.  Placing a warm, wet washcloth over the abscess. Follow these instructions at home: Medicines   Take over-the-counter and prescription medicines only as told by your doctor.  If you were prescribed an antibiotic medicine, take it as told by your doctor. Do not stop taking the antibiotic even if you start to feel better. Abscess care   If you have an abscess that has not drained, place a warm, clean, wet washcloth over the abscess several times a day. Do this as told by your doctor.  Follow instructions from your doctor about how to take care of your abscess. Make sure you: ? Cover the abscess with a bandage (dressing). ? Change your bandage or gauze as told by your doctor. ? Wash your hands with soap and water before you change the bandage or gauze. If you cannot use soap and water, use hand sanitizer.  Check your abscess every day for signs  that the infection is getting worse. Check for: ? More redness, swelling, or pain. ? More fluid or blood. ? Warmth. ? More pus or a bad smell. General instructions  To avoid spreading the infection: ? Do not share personal care items, towels, or hot tubs with others. ? Avoid making skin-to-skin contact with other people.  Keep all follow-up visits as told by your doctor. This is important. Contact a doctor if:  You have more redness, swelling, or pain around your abscess.  You have more fluid or blood coming from your abscess.  Your abscess feels warm when you touch it.  You have more pus or a bad smell coming from your abscess.  You have a fever.  Your muscles ache.  You  have chills.  You feel sick. Get help right away if:  You have very bad (severe) pain.  You see red streaks on your skin spreading away from the abscess. Summary  A skin abscess is an infected area of your skin that contains pus and other material.  The abscess is caused by germs that enter the skin through a cut or scrape. It can also be caused by blocked oil and sweat glands or infected hair follicles.  Follow your doctor's instructions on caring for your abscess, taking medicines, preventing infections, and keeping follow-up visits. This information is not intended to replace advice given to you by your health care provider. Make sure you discuss any questions you have with your health care provider. Document Released: 02/15/2008 Document Revised: 10/12/2017 Document Reviewed: 10/12/2017 Elsevier Interactive Patient Education  2019 [place] [facility]

## 2018-11-05 DIAGNOSIS — R972 Elevated prostate specific antigen [PSA]: Secondary | ICD-10-CM | POA: Diagnosis not present

## 2018-11-22 DIAGNOSIS — C61 Malignant neoplasm of prostate: Secondary | ICD-10-CM | POA: Diagnosis not present

## 2018-12-18 DIAGNOSIS — C61 Malignant neoplasm of prostate: Secondary | ICD-10-CM | POA: Diagnosis not present

## 2018-12-31 ENCOUNTER — Other Ambulatory Visit: Payer: Self-pay | Admitting: Urology

## 2019-01-01 DIAGNOSIS — C61 Malignant neoplasm of prostate: Secondary | ICD-10-CM | POA: Diagnosis not present

## 2019-01-01 DIAGNOSIS — N393 Stress incontinence (female) (male): Secondary | ICD-10-CM | POA: Diagnosis not present

## 2019-01-01 DIAGNOSIS — M6281 Muscle weakness (generalized): Secondary | ICD-10-CM | POA: Diagnosis not present

## 2019-01-01 DIAGNOSIS — R351 Nocturia: Secondary | ICD-10-CM | POA: Diagnosis not present

## 2019-01-17 ENCOUNTER — Other Ambulatory Visit: Payer: Self-pay | Admitting: Urology

## 2019-01-17 DIAGNOSIS — M6281 Muscle weakness (generalized): Secondary | ICD-10-CM | POA: Diagnosis not present

## 2019-01-17 DIAGNOSIS — N393 Stress incontinence (female) (male): Secondary | ICD-10-CM | POA: Diagnosis not present

## 2019-01-17 DIAGNOSIS — M62838 Other muscle spasm: Secondary | ICD-10-CM | POA: Diagnosis not present

## 2019-01-17 DIAGNOSIS — C61 Malignant neoplasm of prostate: Secondary | ICD-10-CM | POA: Diagnosis not present

## 2019-02-15 NOTE — Patient Instructions (Signed)
Adrian [name]    Your procedure is scheduled on: 02-21-2019  Report to [facility] Main  Entrance  Report to admitting at [place] 19 TEST ON TODAY 02-18-2019 @1000  AM, THIS TEST MUST BE DONE BEFORE SURGERY, COME TO [place].    Call this number if you have problems the morning of surgery [phone]   Remember: Do not eat food :After Midnight. AFTER MIDNIGHT Tuesday NIGHT, FOLLOW ALL BOWEL PREP INSTRUCTIONS FROM DR Alinda Money WITH CLEAR LIQUIDS ALL DAY 02-20-2019.   BRUSH YOUR TEETH MORNING OF SURGERY AND RINSE YOUR MOUTH OUT, NO CHEWING GUM CANDY OR MINTS.     CLEAR LIQUID DIET   Foods Allowed                                                                     Foods Excluded  Coffee and tea, regular and decaf                             liquids that you cannot  Plain Jell-O in any flavor                                             see through such as: Fruit ices (not with fruit pulp)                                     milk, soups, orange juice  Iced Popsicles                                    All solid food Carbonated beverages, regular and diet                                    Cranberry, grape and apple juices Sports drinks like Gatorade Lightly seasoned clear broth or consume(fat free) Sugar, honey syrup  Sample Menu Breakfast                                Lunch                                     Supper Cranberry juice                    Beef broth                            Chicken broth Jell-O  Grape juice                           Apple juice Coffee or tea                        Jell-O                                      Popsicle                                                Coffee or tea                        Coffee or tea  _____________________________________________________________________     Take these medicines the morning of surgery with  A SIP OF WATER: NONE                               You may not have any metal on your body including hair pins and              piercings  Do not wear jewelry, make-up, lotions, powders or perfumes, deodorant             Do not wear nail polish.  Do not shave  48 hours prior to surgery.              Men may shave face and neck.   Do not bring valuables to the hospital. [place].  Contacts, dentures or bridgework may not be worn into surgery.  Leave suitcase in the car. After surgery it may be brought to your room.   _____________________________________________________________________             [facility] - Preparing for Surgery Before surgery, you can play an important role.  Because skin is not sterile, your skin needs to be as free of germs as possible.  You can reduce the number of germs on your skin by washing with CHG (chlorahexidine gluconate) soap before surgery.  CHG is an antiseptic cleaner which kills germs and bonds with the skin to continue killing germs even after washing. Please DO NOT use if you have an allergy to CHG or antibacterial soaps.  If your skin becomes reddened/irritated stop using the CHG and inform your nurse when you arrive at Short Stay. Do not shave (including legs and underarms) for at least 48 hours prior to the first CHG shower.  You may shave your face/neck. Please follow these instructions carefully:  1.  Shower with CHG Soap the night before surgery and the  morning of Surgery.  2.  If you choose to wash your hair, wash your hair first as usual with your  normal  shampoo.  3.  After you shampoo, rinse your hair and body thoroughly to remove the  shampoo.                           4.  Use CHG as you would  any other liquid soap.  You can apply chg directly  to the skin and wash                       Gently with a scrungie or clean washcloth.  5.  Apply the CHG Soap to your body ONLY FROM THE NECK DOWN.    Do not use on face/ open                           Wound or open sores. Avoid contact with eyes, ears mouth and genitals (private parts).                       Wash face,  Genitals (private parts) with your normal soap.             6.  Wash thoroughly, paying special attention to the area where your surgery  will be performed.  7.  Thoroughly rinse your body with warm water from the neck down.  8.  DO NOT shower/wash with your normal soap after using and rinsing off  the CHG Soap.                9.  Pat yourself dry with a clean towel.            10.  Wear clean pajamas.            11.  Place clean sheets on your bed the night of your first shower and do not  sleep with pets. Day of Surgery : Do not apply any lotions/deodorants the morning of surgery.  Please wear clean clothes to the hospital/surgery center.  FAILURE TO FOLLOW THESE INSTRUCTIONS MAY RESULT IN THE CANCELLATION OF YOUR SURGERY PATIENT SIGNATURE_________________________________  NURSE SIGNATURE__________________________________  ________________________________________________________________________   Adam Phenix  An incentive spirometer is a tool that can help keep your lungs clear and active. This tool measures how well you are filling your lungs with each breath. Taking long deep breaths may help reverse or decrease the chance of developing breathing (pulmonary) problems (especially infection) following:  A long period of time when you are unable to move or be active. BEFORE THE PROCEDURE   If the spirometer includes an indicator to show your best effort, your nurse or respiratory therapist will set it to a desired goal.  If possible, sit up straight or lean slightly forward. Try not to slouch.  Hold the incentive spirometer in an upright position. INSTRUCTIONS FOR USE  1. Sit on the edge of your bed if possible, or sit up as far as you can in bed or on a chair. 2. Hold the incentive spirometer in an  upright position. 3. Breathe out normally. 4. Place the mouthpiece in your mouth and seal your lips tightly around it. 5. Breathe in slowly and as deeply as possible, raising the piston or the ball toward the top of the column. 6. Hold your breath for 3-5 seconds or for as long as possible. Allow the piston or ball to fall to the bottom of the column. 7. Remove the mouthpiece from your mouth and breathe out normally. 8. Rest for a few seconds and repeat Steps 1 through 7 at least 10 times every 1-2 hours when you are awake. Take your time and take a few normal breaths between deep breaths. 9. The spirometer may include an indicator to show your best effort. Use the indicator  as a goal to work toward during each repetition. 10. After each set of 10 deep breaths, practice coughing to be sure your lungs are clear. If you have an incision (the cut made at the time of surgery), support your incision when coughing by placing a pillow or rolled up towels firmly against it. Once you are able to get out of bed, walk around indoors and cough well. You may stop using the incentive spirometer when instructed by your caregiver.  RISKS AND COMPLICATIONS  Take your time so you do not get dizzy or light-headed.  If you are in pain, you may need to take or ask for pain medication before doing incentive spirometry. It is harder to take a deep breath if you are having pain. AFTER USE  Rest and breathe slowly and easily.  It can be helpful to keep track of a log of your progress. Your caregiver can provide you with a simple table to help with this. If you are using the spirometer at home, follow these instructions: [place] IF:   You are having difficultly using the spirometer.  You have trouble using the spirometer as often as instructed.  Your pain medication is not giving enough relief while using the spirometer.  You develop fever of 100.5 F (38.1 C) or higher. SEEK IMMEDIATE MEDICAL CARE  IF:   You cough up bloody sputum that had not been present before.  You develop fever of 102 F (38.9 C) or greater.  You develop worsening pain at or near the incision site. MAKE SURE YOU:   Understand these instructions.  Will watch your condition.  Will get help right away if you are not doing well or get worse. Document Released: 01/09/2007 Document Revised: 11/21/2011 Document Reviewed: 03/12/2007 ExitCare Patient Information 2014 [employer], [place].   ________________________________________________________________________  WHAT IS A BLOOD TRANSFUSION? Blood Transfusion Information  A transfusion is the replacement of blood or some of its parts. Blood is made up of multiple cells which provide different functions.  Red blood cells carry oxygen and are used for blood loss replacement.  White blood cells fight against infection.  Platelets control bleeding.  Plasma helps clot blood.  Other blood products are available for specialized needs, such as hemophilia or other clotting disorders. BEFORE THE TRANSFUSION  Who gives blood for transfusions?   Healthy volunteers who are fully evaluated to make sure their blood is safe. This is blood bank blood. Transfusion therapy is the safest it has ever been in the practice of medicine. Before blood is taken from a donor, a complete history is taken to make sure that person has no history of diseases nor engages in risky social behavior (examples are intravenous drug use or sexual activity with multiple partners). The donor's travel history is screened to minimize risk of transmitting infections, such as malaria. The donated blood is tested for signs of infectious diseases, such as HIV and hepatitis. The blood is then tested to be sure it is compatible with you in order to minimize the chance of a transfusion reaction. If you or a relative donates blood, this is often done in anticipation of surgery and is not appropriate for emergency  situations. It takes many days to process the donated blood. RISKS AND COMPLICATIONS Although transfusion therapy is very safe and saves many lives, the main dangers of transfusion include:   Getting an infectious disease.  Developing a transfusion reaction. This is an allergic reaction to something in the blood you were  given. Every precaution is taken to prevent this. The decision to have a blood transfusion has been considered carefully by your caregiver before blood is given. Blood is not given unless the benefits outweigh the risks. AFTER THE TRANSFUSION  Right after receiving a blood transfusion, you will usually feel much better and more energetic. This is especially true if your red blood cells have gotten low (anemic). The transfusion raises the level of the red blood cells which carry oxygen, and this usually causes an energy increase.  The nurse administering the transfusion will monitor you carefully for complications. HOME CARE INSTRUCTIONS  No special instructions are needed after a transfusion. You may find your energy is better. Speak with your caregiver about any limitations on activity for underlying diseases you may have. SEEK MEDICAL CARE IF:   Your condition is not improving after your transfusion.  You develop redness or irritation at the intravenous (IV) site. SEEK IMMEDIATE MEDICAL CARE IF:  Any of the following symptoms occur over the next 12 hours:  Shaking chills.  You have a temperature by mouth above 102 F (38.9 C), not controlled by medicine.  Chest, back, or muscle pain.  People around you feel you are not acting correctly or are confused.  Shortness of breath or difficulty breathing.  Dizziness and fainting.  You get a rash or develop hives.  You have a decrease in urine output.  Your urine turns a dark color or changes to pink, red, or brown. Any of the following symptoms occur over the next 10 days:  You have a temperature by mouth above  102 F (38.9 C), not controlled by medicine.  Shortness of breath.  Weakness after normal activity.  The white part of the eye turns yellow (jaundice).  You have a decrease in the amount of urine or are urinating less often.  Your urine turns a dark color or changes to pink, red, or brown. Document Released: 08/26/2000 Document Revised: 11/21/2011 Document Reviewed: 04/14/2008 [facility] Patient Information 2014 [place], [place].  _______________________________________________________________________

## 2019-02-18 ENCOUNTER — Inpatient Hospital Stay (HOSPITAL_COMMUNITY): Admission: RE | Admit: 2019-02-18 | Payer: Federal, State, Local not specified - PPO | Source: Ambulatory Visit

## 2019-02-18 ENCOUNTER — Other Ambulatory Visit: Payer: Self-pay

## 2019-02-18 ENCOUNTER — Encounter (HOSPITAL_COMMUNITY): Payer: Self-pay

## 2019-02-18 ENCOUNTER — Encounter (HOSPITAL_COMMUNITY)
Admission: RE | Admit: 2019-02-18 | Discharge: 2019-02-18 | Disposition: A | Discharge status: Home / Self Care | Payer: Federal, State, Local not specified - PPO | Source: Ambulatory Visit | Attending: Urology | Admitting: Urology

## 2019-02-18 DIAGNOSIS — C61 Malignant neoplasm of prostate: Secondary | ICD-10-CM | POA: Diagnosis not present

## 2019-02-18 DIAGNOSIS — Z01812 Encounter for preprocedural laboratory examination: Secondary | ICD-10-CM | POA: Insufficient documentation

## 2019-02-18 LAB — BASIC METABOLIC PANEL
Anion gap: 6 (ref 5–15)
BUN: 17 mg/dL (ref 6–20)
CO2: 22 mmol/L (ref 22–32)
Calcium: 8.9 mg/dL (ref 8.9–10.3)
Chloride: 111 mmol/L (ref 98–111)
Creatinine, Ser: 1.07 mg/dL (ref 0.61–1.24)
GFR calc Af Amer: >60 mL/min (ref >60)
GFR calc non Af Amer: >60 mL/min (ref >60)
Glucose, Bld: 104 mg/dL — ABNORMAL HIGH (ref 70–99)
Potassium: 4 mmol/L (ref 3.5–5.1)
Sodium: 139 mmol/L (ref 135–145)

## 2019-02-18 LAB — CBC
HCT: 46.1 % (ref 39.0–52.0)
Hemoglobin: 15.8 g/dL (ref 13.0–17.0)
MCH: 30 pg (ref 26.0–34.0)
MCHC: 34.3 g/dL (ref 30.0–36.0)
MCV: 87.5 fL (ref 80.0–100.0)
Platelets: 254 10*3/uL (ref 150–400)
RBC: 5.27 MIL/uL (ref 4.22–5.81)
RDW: 12.2 % (ref 11.5–15.5)
WBC: 5.7 10*3/uL (ref 4.0–10.5)
nRBC: 0 % (ref 0.0–0.2)

## 2019-02-18 LAB — ABO/RH: ABO/RH(D): A POS

## 2019-02-18 MED ORDER — MAGNESIUM CITRATE PO SOLN
1.0000 | Freq: Once | ORAL | Status: DC
  Filled 2019-02-18: qty 296

## 2019-02-18 MED ORDER — FLEET ENEMA 7-19 GM/118ML RE ENEM
1.0000 | ENEMA | Freq: Once | RECTAL | Status: DC
  Filled 2019-02-18: qty 1

## 2019-02-20 ENCOUNTER — Other Ambulatory Visit (HOSPITAL_COMMUNITY)
Admission: RE | Admit: 2019-02-20 | Discharge: 2019-02-20 | Disposition: A | Discharge status: Home / Self Care | Payer: Federal, State, Local not specified - PPO | Source: Ambulatory Visit | Attending: Urology | Admitting: Urology

## 2019-02-20 DIAGNOSIS — Z01812 Encounter for preprocedural laboratory examination: Secondary | ICD-10-CM | POA: Diagnosis not present

## 2019-02-20 DIAGNOSIS — Z1159 Encounter for screening for other viral diseases: Secondary | ICD-10-CM | POA: Insufficient documentation

## 2019-02-20 NOTE — Anesthesia Preprocedure Evaluation (Addendum)
Anesthesia Evaluation  Patient identified by MRN, date of birth, ID band Patient awake    Reviewed: Allergy & Precautions, NPO status , Patient's Chart, lab work & pertinent test results  History of Anesthesia Complications Negative for: history of anesthetic complications  Airway Mallampati: II  TM Distance: >3 FB Neck ROM: Full    Dental no notable dental hx. (+) Dental Advisory Given   Pulmonary neg pulmonary ROS,    Pulmonary exam normal        Cardiovascular negative cardio ROS Normal cardiovascular exam     Neuro/Psych negative neurological ROS     GI/Hepatic negative GI ROS, Neg liver ROS,   Endo/Other  negative endocrine ROS  Renal/GU Renal disease     Musculoskeletal negative musculoskeletal ROS (+)   Abdominal   Peds  Hematology negative hematology ROS (+)   Anesthesia Other Findings Day of surgery medications reviewed with the patient.  Reproductive/Obstetrics                            Anesthesia Physical Anesthesia Plan  ASA: II  Anesthesia Plan: General   Post-op Pain Management:    Induction: Intravenous  PONV Risk Score and Plan: 3 and Ondansetron, Dexamethasone and Scopolamine patch - Pre-op  Airway Management Planned: Oral ETT  Additional Equipment:   Intra-op Plan:   Post-operative Plan: Extubation in OR  Informed Consent: I have reviewed the patients History and Physical, chart, labs and discussed the procedure including the risks, benefits and alternatives for the proposed anesthesia with the patient or authorized representative who has indicated his/her understanding and acceptance.     Dental advisory given  Plan Discussed with: CRNA, Anesthesiologist and Surgeon  Anesthesia Plan Comments:        Anesthesia Quick Evaluation

## 2019-02-20 NOTE — Progress Notes (Signed)
SPOKE W/  _Patient     SCREENING SYMPTOMS OF COVID 19:   COUGH--no  RUNNY NOSE--- no  SORE THROAT---no  NASAL CONGESTION----no  SNEEZING----no  SHORTNESS OF BREATH---no  DIFFICULTY BREATHING---no  TEMP >100.0 -----no  UNEXPLAINED BODY ACHES------no  CHILLS -------- no  HEADACHES ---------no  LOSS OF SMELL/ TASTE --------no    HAVE YOU OR ANY FAMILY MEMBER TRAVELLED PAST 14 DAYS OUT OF THE   COUNTY---no STATE----no COUNTRY----no  HAVE YOU OR ANY FAMILY MEMBER BEEN EXPOSED TO ANYONE WITH COVID 19? no    

## 2019-02-20 NOTE — H&P (Signed)
CC/HPI: CC: Prostate Cancer    [name] is a 58 year old gentleman who was found to have an elevated PSA of 7.9 prompting a TRUS biopsy of the prostate on 11/05/18. 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   Family history: He does have a brother who is 45 years older who has a diagnosis of prostate cancer. His brothers prostate cancer was apparently at a more advanced age.   Imaging studies: None.   PMH: He no medical comorbidities.  PSH: No abdominal surgeries. 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   TNM stage: cT1c Nx Mx  PSA: 7.9  Gleason score: 3+4=7  Biopsy (11/05/18): 3/12 cores positive  Left: Benign  Right: R apex (40%, 3+4=7), R lateral apex (90%, 3+4=7, PNI), R mid (40%, 3+3=6)  Prostate volume: 31.9 cc   Nomogram  OC disease: 48%  EPE: 51%  SVI: 2%  LNI: 3%  PFS (5 year, 10 year): 85%,74%   Urinary function: IPSS is 8.  Erectile function: SHIM score is 25.     ALLERGIES: None   MEDICATIONS: Levaquin 750 mg tablet Take 1 tablet PO the morning of your procedure     GU PSH: ESWL - 2009, 2009 Prostate Needle Biopsy - 11/05/2018      PSH Notes: Foot Surgery, Lithotripsy, Knee Arthroscopy, Lithotripsy   NON-GU PSH: Knee Arthroscopy, Left Partial Remove Foot Fascia, Bilateral Surgical Pathology, Gross And Microscopic Examination For Prostate Needle - 11/05/2018    GU PMH: Prostate Cancer - 11/22/2018 Elevated PSA - 11/05/2018, - 08/28/2018 History of urolithiasis, Nephrolithiasis - 2014 Nocturia, Nocturia - 2014 Other microscopic hematuria, Microscopic hematuria - 2014 Renal calculus, Kidney stone on right side - 2014    NON-GU PMH: Encounter for general adult medical examination without abnormal findings, Encounter for preventive health examination    FAMILY HISTORY: Death of family member -  Father Kidney Stones - Runs in Family Lung Cancer - Father    Notes: 1 son; 1 daughter   SOCIAL HISTORY: Marital Status: Married Preferred Language: English; Ethnicity: Not Hispanic Or Latino; Race: White Current Smoking Status: Patient has never smoked.   Tobacco Use Assessment Completed: Used Tobacco in last 30 days? Does not use smokeless tobacco. Has never drank.  Drinks 3 caffeinated drinks per day. Patient's occupation is/was [place] [employer].     Notes: Former smoker, Tobacco Use, Marital History - Currently Married, Occupation:, Alcohol Use, Caffeine Use   REVIEW OF SYSTEMS:    GU Review Male:   Patient denies frequent urination, hard to postpone urination, burning/ pain with urination, get up at night to urinate, leakage of urine, stream starts and stops, trouble starting your streams, and have to strain to urinate .  Gastrointestinal (Lower):   Patient denies diarrhea and constipation.  Gastrointestinal (Upper):   Patient denies nausea and vomiting.  Constitutional:   Patient denies fever, night sweats, weight loss, and fatigue.  Skin:   Patient denies skin rash/ lesion and itching.  Eyes:   Patient denies blurred vision and double vision.  Ears/ Nose/ Throat:   Patient denies sore throat and sinus problems.  Hematologic/Lymphatic:   Patient denies easy bruising and swollen glands.  Cardiovascular:   Patient denies leg swelling and chest pains.  Respiratory:   Patient denies cough and shortness of breath.  Endocrine:   Patient  denies excessive thirst.  Musculoskeletal:   Patient denies back pain and joint pain.  Neurological:   Patient denies headaches and dizziness.  Psychologic:   Patient denies depression and anxiety.   VITAL SIGNS:     Weight 200 lb / 90.72 kg  Height 71 in / 180.34 cm  BMI 27.9 kg/m     MULTI-SYSTEM PHYSICAL EXAMINATION:    Constitutional: Well-nourished. No physical deformities. Normally developed. Good grooming.  Neck: Neck symmetrical,  not swollen. Normal tracheal position.  Respiratory: No labored breathing, no use of accessory muscles. Clear bilaterally.  Cardiovascular: Normal temperature, normal extremity pulses, no swelling, no varicosities. Regular rate and rhythm.  Lymphatic: No enlargement of neck, axillae, groin.  Skin: No paleness, no jaundice, no cyanosis. No lesion, no ulcer, no rash.  Neurologic / Psychiatric: Oriented to time, oriented to place, oriented to person. No depression, no anxiety, no agitation.  Gastrointestinal: No mass, no tenderness, no rigidity, non obese abdomen.  Eyes: Normal conjunctivae. Normal eyelids.  Ears, Nose, Mouth, and Throat: Left ear no scars, no lesions, no masses. Right ear no scars, no lesions, no masses. Nose no scars, no lesions, no masses. Normal hearing. Normal lips.  Musculoskeletal: Normal gait and station of head and neck.        ASSESSMENT:      ICD-10 Details  1 GU:   Prostate Cancer - C61    PLAN:               1. Prostate cancer:  He is very well informed and understands the recommendation to proceed with treatment of curative intent considering his life expectancy and disease parameters. He does adamantly wished to proceed with surgical therapy. He will be scheduled undergo a bilateral nerve-sparing robot assisted laparoscopic radical prostatectomy and bilateral pelvic lymphadenectomy. We plan to proceed sometime in the mid summer.

## 2019-02-21 ENCOUNTER — Other Ambulatory Visit: Payer: Self-pay

## 2019-02-21 ENCOUNTER — Ambulatory Visit (HOSPITAL_COMMUNITY): Payer: Federal, State, Local not specified - PPO | Admitting: Physician Assistant

## 2019-02-21 ENCOUNTER — Encounter (HOSPITAL_COMMUNITY)
Admission: RE | Disposition: A | Discharge status: Home / Self Care | Payer: Self-pay | Source: Home / Self Care | Attending: Urology

## 2019-02-21 ENCOUNTER — Encounter (HOSPITAL_COMMUNITY): Payer: Self-pay

## 2019-02-21 ENCOUNTER — Ambulatory Visit (HOSPITAL_COMMUNITY): Payer: Federal, State, Local not specified - PPO | Admitting: Anesthesiology

## 2019-02-21 ENCOUNTER — Observation Stay (HOSPITAL_COMMUNITY)
Admission: RE | Admit: 2019-02-21 | Discharge: 2019-02-22 | Disposition: A | Discharge status: Home / Self Care | Payer: Federal, State, Local not specified - PPO | Attending: Urology | Admitting: Urology

## 2019-02-21 DIAGNOSIS — Z8042 Family history of malignant neoplasm of prostate: Secondary | ICD-10-CM | POA: Insufficient documentation

## 2019-02-21 DIAGNOSIS — C61 Malignant neoplasm of prostate: Principal | ICD-10-CM | POA: Insufficient documentation

## 2019-02-21 DIAGNOSIS — M75102 Unspecified rotator cuff tear or rupture of left shoulder, not specified as traumatic: Secondary | ICD-10-CM | POA: Diagnosis not present

## 2019-02-21 DIAGNOSIS — Z885 Allergy status to narcotic agent status: Secondary | ICD-10-CM | POA: Diagnosis not present

## 2019-02-21 DIAGNOSIS — Z1159 Encounter for screening for other viral diseases: Secondary | ICD-10-CM | POA: Diagnosis not present

## 2019-02-21 DIAGNOSIS — R972 Elevated prostate specific antigen [PSA]: Secondary | ICD-10-CM | POA: Diagnosis not present

## 2019-02-21 DIAGNOSIS — N200 Calculus of kidney: Secondary | ICD-10-CM | POA: Diagnosis not present

## 2019-02-21 HISTORY — PX: LYMPHADENECTOMY: SHX5960

## 2019-02-21 HISTORY — PX: ROBOT ASSISTED LAPAROSCOPIC RADICAL PROSTATECTOMY: SHX5141

## 2019-02-21 LAB — HEMOGLOBIN AND HEMATOCRIT, BLOOD
HCT: 49.8 % (ref 39.0–52.0)
Hemoglobin: 16.4 g/dL (ref 13.0–17.0)

## 2019-02-21 LAB — NOVEL CORONAVIRUS, NAA (HOSP ORDER, SEND-OUT TO REF LAB; TAT 18-24 HRS): SARS-CoV-2, NAA: NOT DETECTED

## 2019-02-21 LAB — SARS CORONAVIRUS 2 BY RT PCR (HOSPITAL ORDER, PERFORMED IN ~~LOC~~ HOSPITAL LAB): SARS Coronavirus 2: NEGATIVE

## 2019-02-21 LAB — TYPE AND SCREEN
ABO/RH(D): A POS
Antibody Screen: NEGATIVE

## 2019-02-21 SURGERY — XI ROBOTIC ASSISTED LAPAROSCOPIC RADICAL PROSTATECTOMY LEVEL 2
Anesthesia: General

## 2019-02-21 MED ORDER — MIDAZOLAM HCL 2 MG/2ML IJ SOLN
INTRAMUSCULAR | Status: DC | PRN
  Administered 2019-02-21: 2 mg via INTRAVENOUS

## 2019-02-21 MED ORDER — BACITRACIN-NEOMYCIN-POLYMYXIN 400-5-5000 EX OINT: 1.0000 "application " | TOPICAL_OINTMENT | Freq: Three times a day (TID) | CUTANEOUS | Status: DC | PRN

## 2019-02-21 MED ORDER — BELLADONNA ALKALOIDS-OPIUM 16.2-60 MG RE SUPP: 1.0000 | Freq: Four times a day (QID) | RECTAL | Status: DC | PRN

## 2019-02-21 MED ORDER — DOCUSATE SODIUM 100 MG PO CAPS
100.0000 mg | ORAL_CAPSULE | Freq: Two times a day (BID) | ORAL | Status: DC
  Administered 2019-02-21 – 2019-02-22 (×2): 100 mg via ORAL
  Filled 2019-02-21: qty 1

## 2019-02-21 MED ORDER — KCL IN DEXTROSE-NACL 20-5-0.45 MEQ/L-%-% IV SOLN
INTRAVENOUS | Status: DC
  Administered 2019-02-21 – 2019-02-22 (×3): via INTRAVENOUS
  Filled 2019-02-21 (×4): qty 1000

## 2019-02-21 MED ORDER — LIDOCAINE 2% (20 MG/ML) 5 ML SYRINGE
INTRAMUSCULAR | Status: DC | PRN
  Administered 2019-02-21: 50 mg via INTRAVENOUS

## 2019-02-21 MED ORDER — FENTANYL CITRATE (PF) 100 MCG/2ML IJ SOLN: 25.0000 ug | INTRAMUSCULAR | Status: DC | PRN

## 2019-02-21 MED ORDER — BUPIVACAINE-EPINEPHRINE (PF) 0.25% -1:200000 IJ SOLN
INTRAMUSCULAR | Status: AC
  Filled 2019-02-21: qty 30

## 2019-02-21 MED ORDER — SUGAMMADEX SODIUM 200 MG/2ML IV SOLN
INTRAVENOUS | Status: AC
  Filled 2019-02-21: qty 2

## 2019-02-21 MED ORDER — HEPARIN SODIUM (PORCINE) 1000 UNIT/ML IJ SOLN
INTRAMUSCULAR | Status: AC
  Filled 2019-02-21: qty 1

## 2019-02-21 MED ORDER — FENTANYL CITRATE (PF) 100 MCG/2ML IJ SOLN
INTRAMUSCULAR | Status: AC
  Filled 2019-02-21: qty 2

## 2019-02-21 MED ORDER — TRAMADOL HCL 50 MG PO TABS: 50.0000 mg | ORAL_TABLET | Freq: Four times a day (QID) | ORAL | 0 refills | Status: DC | PRN

## 2019-02-21 MED ORDER — BUPIVACAINE-EPINEPHRINE 0.25% -1:200000 IJ SOLN
INTRAMUSCULAR | Status: DC | PRN
  Administered 2019-02-21: 30 mL

## 2019-02-21 MED ORDER — LACTATED RINGERS IV SOLN
INTRAVENOUS | Status: DC
  Administered 2019-02-21 (×2): via INTRAVENOUS

## 2019-02-21 MED ORDER — ONDANSETRON HCL 4 MG/2ML IJ SOLN
INTRAMUSCULAR | Status: DC | PRN
  Administered 2019-02-21: 4 mg via INTRAVENOUS

## 2019-02-21 MED ORDER — CEFAZOLIN SODIUM-DEXTROSE 2-4 GM/100ML-% IV SOLN
2.0000 g | Freq: Once | INTRAVENOUS | Status: AC
  Administered 2019-02-21: 2 g via INTRAVENOUS
  Filled 2019-02-21: qty 100

## 2019-02-21 MED ORDER — ONDANSETRON HCL 4 MG/2ML IJ SOLN: 4.0000 mg | INTRAMUSCULAR | Status: DC | PRN

## 2019-02-21 MED ORDER — SUCCINYLCHOLINE CHLORIDE 200 MG/10ML IV SOSY
PREFILLED_SYRINGE | INTRAVENOUS | Status: AC
  Filled 2019-02-21: qty 10

## 2019-02-21 MED ORDER — ACETAMINOPHEN 325 MG PO TABS
650.0000 mg | ORAL_TABLET | ORAL | Status: DC | PRN
  Administered 2019-02-21: 650 mg via ORAL
  Filled 2019-02-21: qty 2

## 2019-02-21 MED ORDER — PROPOFOL 10 MG/ML IV BOLUS
INTRAVENOUS | Status: DC | PRN
  Administered 2019-02-21: 20 mg via INTRAVENOUS
  Administered 2019-02-21: 180 mg via INTRAVENOUS

## 2019-02-21 MED ORDER — ROCURONIUM BROMIDE 10 MG/ML (PF) SYRINGE
PREFILLED_SYRINGE | INTRAVENOUS | Status: DC | PRN
  Administered 2019-02-21: 5 mg via INTRAVENOUS
  Administered 2019-02-21: 10 mg via INTRAVENOUS
  Administered 2019-02-21: 20 mg via INTRAVENOUS
  Administered 2019-02-21: 10 mg via INTRAVENOUS
  Administered 2019-02-21: 50 mg via INTRAVENOUS
  Administered 2019-02-21 (×6): 10 mg via INTRAVENOUS

## 2019-02-21 MED ORDER — PROPOFOL 10 MG/ML IV BOLUS
INTRAVENOUS | Status: AC
  Filled 2019-02-21: qty 20

## 2019-02-21 MED ORDER — SCOPOLAMINE 1 MG/3DAYS TD PT72
1.0000 | MEDICATED_PATCH | TRANSDERMAL | Status: DC
  Administered 2019-02-21: 1.5 mg via TRANSDERMAL
  Filled 2019-02-21: qty 1

## 2019-02-21 MED ORDER — ZOLPIDEM TARTRATE 5 MG PO TABS: 5.0000 mg | ORAL_TABLET | Freq: Every evening | ORAL | Status: DC | PRN

## 2019-02-21 MED ORDER — DEXMEDETOMIDINE HCL IN NACL 200 MCG/50ML IV SOLN
INTRAVENOUS | Status: AC
  Filled 2019-02-21: qty 50

## 2019-02-21 MED ORDER — CEFAZOLIN SODIUM-DEXTROSE 1-4 GM/50ML-% IV SOLN
1.0000 g | Freq: Three times a day (TID) | INTRAVENOUS | Status: AC
  Administered 2019-02-21 (×2): 1 g via INTRAVENOUS
  Filled 2019-02-21 (×2): qty 50

## 2019-02-21 MED ORDER — LIDOCAINE 2% (20 MG/ML) 5 ML SYRINGE
INTRAMUSCULAR | Status: AC
  Filled 2019-02-21: qty 5

## 2019-02-21 MED ORDER — ACETAMINOPHEN 500 MG PO TABS
1000.0000 mg | ORAL_TABLET | Freq: Once | ORAL | Status: AC
  Administered 2019-02-21: 1000 mg via ORAL
  Filled 2019-02-21: qty 2

## 2019-02-21 MED ORDER — PROMETHAZINE HCL 25 MG/ML IJ SOLN: 6.2500 mg | INTRAMUSCULAR | Status: DC | PRN

## 2019-02-21 MED ORDER — ROCURONIUM BROMIDE 10 MG/ML (PF) SYRINGE
PREFILLED_SYRINGE | INTRAVENOUS | Status: AC
  Filled 2019-02-21: qty 10

## 2019-02-21 MED ORDER — FENTANYL CITRATE (PF) 250 MCG/5ML IJ SOLN
INTRAMUSCULAR | Status: DC | PRN
  Administered 2019-02-21 (×2): 50 ug via INTRAVENOUS
  Administered 2019-02-21: 25 ug via INTRAVENOUS
  Administered 2019-02-21 (×2): 50 ug via INTRAVENOUS
  Administered 2019-02-21: 25 ug via INTRAVENOUS
  Administered 2019-02-21: 50 ug via INTRAVENOUS

## 2019-02-21 MED ORDER — SULFAMETHOXAZOLE-TRIMETHOPRIM 800-160 MG PO TABS: 1.0000 | ORAL_TABLET | Freq: Two times a day (BID) | ORAL | 0 refills | Status: DC

## 2019-02-21 MED ORDER — MORPHINE SULFATE (PF) 2 MG/ML IV SOLN: 2.0000 mg | INTRAVENOUS | Status: DC | PRN

## 2019-02-21 MED ORDER — SODIUM CHLORIDE 0.9 % IR SOLN
Status: DC | PRN
  Administered 2019-02-21: 1000 mL via INTRAVESICAL

## 2019-02-21 MED ORDER — DIPHENHYDRAMINE HCL 50 MG/ML IJ SOLN: 12.5000 mg | Freq: Four times a day (QID) | INTRAMUSCULAR | Status: DC | PRN

## 2019-02-21 MED ORDER — DEXAMETHASONE SODIUM PHOSPHATE 10 MG/ML IJ SOLN
INTRAMUSCULAR | Status: DC | PRN
  Administered 2019-02-21: 8 mg via INTRAVENOUS

## 2019-02-21 MED ORDER — ONDANSETRON HCL 4 MG/2ML IJ SOLN
INTRAMUSCULAR | Status: AC
  Filled 2019-02-21: qty 2

## 2019-02-21 MED ORDER — SUCCINYLCHOLINE CHLORIDE 200 MG/10ML IV SOSY
PREFILLED_SYRINGE | INTRAVENOUS | Status: DC | PRN
  Administered 2019-02-21: 120 mg via INTRAVENOUS

## 2019-02-21 MED ORDER — SUGAMMADEX SODIUM 200 MG/2ML IV SOLN
INTRAVENOUS | Status: DC | PRN
  Administered 2019-02-21: 200 mg via INTRAVENOUS

## 2019-02-21 MED ORDER — KETOROLAC TROMETHAMINE 15 MG/ML IJ SOLN
15.0000 mg | Freq: Four times a day (QID) | INTRAMUSCULAR | Status: DC
  Administered 2019-02-21 – 2019-02-22 (×5): 15 mg via INTRAVENOUS
  Filled 2019-02-21 (×5): qty 1

## 2019-02-21 MED ORDER — MIDAZOLAM HCL 2 MG/2ML IJ SOLN
INTRAMUSCULAR | Status: AC
  Filled 2019-02-21: qty 2

## 2019-02-21 MED ORDER — DIPHENHYDRAMINE HCL 12.5 MG/5ML PO ELIX: 12.5000 mg | ORAL_SOLUTION | Freq: Four times a day (QID) | ORAL | Status: DC | PRN

## 2019-02-21 MED ORDER — STERILE WATER FOR IRRIGATION IR SOLN
Status: DC | PRN
  Administered 2019-02-21: 1000 mL

## 2019-02-21 MED ORDER — LACTATED RINGERS IV SOLN
INTRAVENOUS | Status: DC | PRN
  Administered 2019-02-21: 1000 mL

## 2019-02-21 MED ORDER — SODIUM CHLORIDE 0.9 % IV BOLUS
1000.0000 mL | Freq: Once | INTRAVENOUS | Status: AC
  Administered 2019-02-21: 1000 mL via INTRAVENOUS

## 2019-02-21 MED ORDER — DEXAMETHASONE SODIUM PHOSPHATE 10 MG/ML IJ SOLN
INTRAMUSCULAR | Status: AC
  Filled 2019-02-21: qty 1

## 2019-02-21 SURGICAL SUPPLY — 57 items
APPLICATOR COTTON TIP 6 STRL (MISCELLANEOUS) ×2 IMPLANT
APPLICATOR COTTON TIP 6IN STRL (MISCELLANEOUS) ×3
CATH FOLEY 2WAY SLVR 18FR 30CC (CATHETERS) ×3 IMPLANT
CATH ROBINSON RED A/P 16FR (CATHETERS) ×3 IMPLANT
CATH ROBINSON RED A/P 8FR (CATHETERS) ×3 IMPLANT
CATH TIEMANN FOLEY 18FR 5CC (CATHETERS) ×3 IMPLANT
CHLORAPREP W/TINT 26 (MISCELLANEOUS) ×3 IMPLANT
CLIP VESOLOCK LG 6/CT PURPLE (CLIP) ×6 IMPLANT
COVER SURGICAL LIGHT HANDLE (MISCELLANEOUS) ×3 IMPLANT
COVER TIP SHEARS 8 DVNC (MISCELLANEOUS) ×2 IMPLANT
COVER TIP SHEARS 8MM DA VINCI (MISCELLANEOUS) ×1
COVER WAND RF STERILE (DRAPES) IMPLANT
CUTTER ECHEON FLEX ENDO 45 340 (ENDOMECHANICALS) ×3 IMPLANT
DECANTER SPIKE VIAL GLASS SM (MISCELLANEOUS) ×3 IMPLANT
DERMABOND ADVANCED (GAUZE/BANDAGES/DRESSINGS) ×1
DERMABOND ADVANCED .7 DNX12 (GAUZE/BANDAGES/DRESSINGS) ×2 IMPLANT
DRAIN CHANNEL RND F F (WOUND CARE) IMPLANT
DRAPE ARM DVNC X/XI (DISPOSABLE) ×8 IMPLANT
DRAPE COLUMN DVNC XI (DISPOSABLE) ×2 IMPLANT
DRAPE DA VINCI XI ARM (DISPOSABLE) ×4
DRAPE DA VINCI XI COLUMN (DISPOSABLE) ×1
DRAPE SURG IRRIG POUCH 19X23 (DRAPES) ×3 IMPLANT
DRSG TEGADERM 4X4.75 (GAUZE/BANDAGES/DRESSINGS) ×3 IMPLANT
ELECT PENCIL ROCKER SW 15FT (MISCELLANEOUS) ×3 IMPLANT
ELECT REM PT RETURN 15FT ADLT (MISCELLANEOUS) ×3 IMPLANT
GAUZE 4X4 16PLY RFD (DISPOSABLE) IMPLANT
GLOVE BIO SURGEON STRL SZ 6.5 (GLOVE) ×3 IMPLANT
GLOVE BIOGEL M STRL SZ7.5 (GLOVE) ×6 IMPLANT
GOWN STRL REUS W/TWL LRG LVL3 (GOWN DISPOSABLE) ×9 IMPLANT
HOLDER FOLEY CATH W/STRAP (MISCELLANEOUS) ×3 IMPLANT
IRRIG SUCT STRYKERFLOW 2 WTIP (MISCELLANEOUS) ×3
IRRIGATION SUCT STRKRFLW 2 WTP (MISCELLANEOUS) ×2 IMPLANT
IV LACTATED RINGERS 1000ML (IV SOLUTION) ×3 IMPLANT
KIT TURNOVER KIT A (KITS) IMPLANT
NDL SAFETY ECLIPSE 18X1.5 (NEEDLE) ×2 IMPLANT
NEEDLE HYPO 18GX1.5 SHARP (NEEDLE) ×1
PACK ROBOT UROLOGY CUSTOM (CUSTOM PROCEDURE TRAY) ×3 IMPLANT
SEAL CANN UNIV 5-8 DVNC XI (MISCELLANEOUS) ×8 IMPLANT
SEAL XI 5MM-8MM UNIVERSAL (MISCELLANEOUS) ×4
SOLUTION ELECTROLUBE (MISCELLANEOUS) ×3 IMPLANT
STAPLE RELOAD 45 GRN (STAPLE) ×2 IMPLANT
STAPLE RELOAD 45MM GREEN (STAPLE) ×1
SUT ETHILON 3 0 PS 1 (SUTURE) ×3 IMPLANT
SUT MNCRL 3 0 RB1 (SUTURE) ×2 IMPLANT
SUT MNCRL 3 0 VIOLET RB1 (SUTURE) ×2 IMPLANT
SUT MNCRL AB 4-0 PS2 18 (SUTURE) ×6 IMPLANT
SUT MONOCRYL 3 0 RB1 (SUTURE) ×2
SUT VIC AB 0 CT1 27 (SUTURE) ×1
SUT VIC AB 0 CT1 27XBRD ANTBC (SUTURE) ×2 IMPLANT
SUT VIC AB 0 UR5 27 (SUTURE) ×3 IMPLANT
SUT VIC AB 2-0 SH 27 (SUTURE) ×3
SUT VIC AB 2-0 SH 27X BRD (SUTURE) ×6 IMPLANT
SUT VICRYL 0 UR6 27IN ABS (SUTURE) ×6 IMPLANT
SYR 27GX1/2 1ML LL SAFETY (SYRINGE) ×3 IMPLANT
TOWEL OR 17X26 10 PK STRL BLUE (TOWEL DISPOSABLE) ×3 IMPLANT
TOWEL OR NON WOVEN STRL DISP B (DISPOSABLE) ×3 IMPLANT
WATER STERILE IRR 1000ML POUR (IV SOLUTION) ×6 IMPLANT

## 2019-02-21 NOTE — Anesthesia Procedure Notes (Signed)
Procedure Name: Intubation Date/Time: 02/21/2019 7:25 AM Performed by: Niel Hummer, CRNA Pre-anesthesia Checklist: Patient being monitored, Suction available, Emergency Drugs available and Patient identified Patient Re-evaluated:Patient Re-evaluated prior to induction Oxygen Delivery Method: Circle system utilized Preoxygenation: Pre-oxygenation with 100% oxygen Induction Type: IV induction and Rapid sequence Laryngoscope Size: Mac and 4 Grade View: Grade I Tube type: Oral Tube size: 7.5 mm Number of attempts: 1 Airway Equipment and Method: Stylet Placement Confirmation: ETT inserted through vocal cords under direct vision,  positive ETCO2 and breath sounds checked- equal and bilateral Secured at: 22 cm Tube secured with: Tape Dental Injury: Teeth and Oropharynx as per pre-operative assessment

## 2019-02-21 NOTE — Progress Notes (Signed)
Patients wife called and notified that patient is out of surgery and in room 1404

## 2019-02-21 NOTE — Op Note (Signed)
Preoperative diagnosis: Clinically localized adenocarcinoma of the prostate (clinical stage T1c Nx Mx)  Postoperative diagnosis: Clinically localized adenocarcinoma of the prostate (clinical stage T1c Nx Mx)  Procedure:  1. Robotic assisted laparoscopic radical prostatectomy (bilateral nerve sparing) 2. Bilateral robotic assisted laparoscopic pelvic lymphadenectomy  Surgeon: [name]. M.D.  Assistant: Debbrah Alar, PA-C  An assistant was required for this surgical procedure.  The duties of the assistant included but were not limited to suctioning, passing suture, camera manipulation, retraction. This procedure would not be able to be performed without an Environmental consultant.  Resident: Dr. Arminda Resides  Anesthesia: General  Complications: None  EBL: 150 mL  IVF:  1200 mL crystalloid  Specimens: 1. Prostate and seminal vesicles 2. Right pelvic lymph nodes 3. Left pelvic lymph nodes  Disposition of specimens: Pathology  Drains: 1. 20 Fr coude catheter 2. # 19 Blake pelvic drain  Indication: Adrian [name] is a 58 y.o. year old patient with clinically localized prostate cancer.  After a thorough review of the management options for treatment of prostate cancer, he elected to proceed with surgical therapy and the above procedure(s).  We have discussed the potential benefits and risks of the procedure, side effects of the proposed treatment, the likelihood of the patient achieving the goals of the procedure, and any potential problems that might occur during the procedure or recuperation. Informed consent has been obtained.  Description of procedure:  The patient was taken to the operating room and a general anesthetic was administered. He was given preoperative antibiotics, placed in the dorsal lithotomy position, and prepped and draped in the usual sterile fashion. Next a preoperative timeout was performed. A urethral catheter was placed into the bladder and a site was selected  near the umbilicus for placement of the camera port. This was placed using a standard open Hassan technique which allowed entry into the peritoneal cavity under direct vision and without difficulty. An 8 mm robotic port was placed and a pneumoperitoneum established. The camera was then used to inspect the abdomen and there was no evidence of any intra-abdominal injuries or other abnormalities. The remaining abdominal ports were then placed. 8 mm robotic ports were placed in the right lower quadrant, left lower quadrant, and far left lateral abdominal wall. A 5 mm port was placed in the right upper quadrant and a 12 mm port was placed in the right lateral abdominal wall for laparoscopic assistance. All ports were placed under direct vision without difficulty. The surgical cart was then docked.   Utilizing the cautery scissors, the bladder was reflected posteriorly allowing entry into the space of Retzius and identification of the endopelvic fascia and prostate. The periprostatic fat was then removed from the prostate allowing full exposure of the endopelvic fascia. The endopelvic fascia was then incised from the apex back to the base of the prostate bilaterally and the underlying levator muscle fibers were swept laterally off the prostate thereby isolating the dorsal venous complex. The dorsal vein was then stapled and divided with a 45 mm Flex Echelon stapler. Attention then turned to the bladder neck which was divided anteriorly thereby allowing entry into the bladder and exposure of the urethral catheter. The catheter balloon was deflated and the catheter was brought into the operative field and used to retract the prostate anteriorly. The posterior bladder neck was then examined and was divided allowing further dissection between the bladder and prostate posteriorly until the vasa deferentia and seminal vessels were identified. The vasa deferentia were  isolated, divided, and lifted anteriorly. The seminal  vesicles were dissected down to their tips with care to control the seminal vascular arterial blood supply. These structures were then lifted anteriorly and the space between Denonvillier's fascia and the anterior rectum was developed with a combination of sharp and blunt dissection. This isolated the vascular pedicles of the prostate.  The lateral prostatic fascia was then sharply incised allowing release of the neurovascular bundles bilaterally. The vascular pedicles of the prostate were then ligated with Weck clips between the prostate and neurovascular bundles and divided with sharp cold scissor dissection resulting in neurovascular bundle preservation. The neurovascular bundles were then separated off the apex of the prostate and urethra bilaterally.  The urethra was then sharply transected allowing the prostate specimen to be disarticulated. The pelvis was copiously irrigated and hemostasis was ensured. There was no evidence for rectal injury.  Attention then turned to the right pelvic sidewall. The fibrofatty tissue between the external iliac vein, confluence of the iliac vessels, hypogastric artery, and Cooper's ligament was dissected free from the pelvic sidewall with care to preserve the obturator nerve. Weck clips were used for lymphostasis and hemostasis. An identical procedure was performed on the contralateral side and the lymphatic packets were removed for permanent pathologic analysis.  Attention then turned to the urethral anastomosis. A 2-0 Vicryl slip knot was placed between Denonvillier's fascia, the posterior bladder neck, and the posterior urethra to reapproximate these structures. During the initial reapproximation, the bladder neck was noted to be split posterior and this was repaired with a running 2-0 vicryl in a tennis racket fashion.  The 2-0 vicryl slip not was then again used to reapproximate the bladder neck and urethra. A double-armed 3-0 Monocryl suture was then used to  perform a 360 running tension-free anastomosis between the bladder neck and urethra. A new urethral catheter was then placed into the bladder and irrigated. There were no blood clots within the bladder and the anastomosis appeared to be watertight. A #19 Blake drain was then brought through the left lateral 8 mm port site and positioned appropriately within the pelvis. It was secured to the skin with a nylon suture. The surgical cart was then undocked. The right lateral 12 mm port site was closed at the fascial level with a 0 Vicryl suture placed laparoscopically. All remaining ports were then removed under direct vision. The prostate specimen was removed intact within the Endopouch retrieval bag via the periumbilical camera port site. This fascial opening was closed with two running 0 Vicryl sutures. 0.25% Marcaine was then injected into all port sites and all incisions were reapproximated at the skin level with 4-0 Monocryl subcuticular sutures and Dermabond. The patient appeared to tolerate the procedure well and without complications. The patient was able to be extubated and transferred to the recovery unit in satisfactory condition.   [name] MD

## 2019-02-21 NOTE — Progress Notes (Signed)
Patient ID: Adrian [name], male   DOB: 12/17/1960, 58 y.o.   MRN: [id number]  Post-op note  Subjective: The patient is doing well.  No complaints.  Objective: Vital signs in last 24 hours: Temp:  [97.6 F (36.4 C)-98.7 F (37.1 C)] 98.5 F (36.9 C) (06/11 1324) Pulse Rate:  [58-83] 79 (06/11 1324) Resp:  [14-21] 18 (06/11 1324) BP: (130-146)/(84-107) 134/90 (06/11 1324) SpO2:  [97 %-100 %] 100 % (06/11 1324) Weight:  [90.3 kg] 90.3 kg (06/11 0635)  Intake/Output from previous day: No intake/output data recorded. Intake/Output this shift: Total I/O In: 3535.7 [I.V.:1435.7; IV TAVWPVXYI:0165] Out: 347 [Urine:125; Drains:22; Blood:200]  Physical Exam:  General: Alert and oriented. Abdomen: Soft, Nondistended. Incisions: Clean and dry. GU: Urine clear.  Lab Results: Recent Labs    02/21/19 1209  HGB 16.4  HCT 49.8    Assessment/Plan: POD#0   1) Continue to monitor, ambulate, IS   Adrian [name]. MD   LOS: 0 days   Adrian [name] 02/21/2019, 4:42 PM

## 2019-02-21 NOTE — Anesthesia Postprocedure Evaluation (Signed)
Anesthesia Post Note  Patient: Adrian [name]  Procedure(s) Performed: XI ROBOTIC ASSISTED LAPAROSCOPIC RADICAL PROSTATECTOMY LEVEL 2 (N/A ) LYMPHADENECTOMY, PELVIC (Bilateral )     Patient location during evaluation: PACU Anesthesia Type: General Level of consciousness: sedated Pain management: pain level controlled Vital Signs Assessment: post-procedure vital signs reviewed and stable Respiratory status: spontaneous breathing and respiratory function stable Cardiovascular status: stable Postop Assessment: no apparent nausea or vomiting Anesthetic complications: no    Last Vitals:  Vitals:   02/21/19 0533 02/21/19 1152  BP: (!) 137/107 (!) 146/92  Pulse: 77 83  Resp: 18 14  Temp: 36.4 C 37.1 C  SpO2: 97% 100%    Last Pain:  Vitals:   02/21/19 1245  TempSrc:   PainSc: Asleep                 Jamesetta Greenhalgh DANIEL

## 2019-02-21 NOTE — Transfer of Care (Signed)
Immediate Anesthesia Transfer of Care Note  Patient: Adrian [name]  Procedure(s) Performed: XI ROBOTIC ASSISTED LAPAROSCOPIC RADICAL PROSTATECTOMY LEVEL 2 (N/A ) LYMPHADENECTOMY, PELVIC (Bilateral )  Patient Location: PACU  Anesthesia Type:General  Level of Consciousness: awake, alert  and oriented  Airway & Oxygen Therapy: Patient Spontanous Breathing and Patient connected to face mask oxygen  Post-op Assessment: Report given to RN and Post -op Vital signs reviewed and stable  Post vital signs: Reviewed and stable  Last Vitals:  Vitals Value Taken Time  BP 168/116 02/21/19 1150  Temp    Pulse 75 02/21/19 1151  Resp 16 02/21/19 1151  SpO2 100 % 02/21/19 1151  Vitals shown include unvalidated device data.  Last Pain:  Vitals:   02/21/19 0635  TempSrc:   PainSc: 0-No pain         Complications: No apparent anesthesia complications

## 2019-02-21 NOTE — Discharge Instructions (Signed)

## 2019-02-22 ENCOUNTER — Encounter (HOSPITAL_COMMUNITY): Payer: Self-pay | Admitting: Urology

## 2019-02-22 DIAGNOSIS — Z885 Allergy status to narcotic agent status: Secondary | ICD-10-CM | POA: Diagnosis not present

## 2019-02-22 DIAGNOSIS — Z8042 Family history of malignant neoplasm of prostate: Secondary | ICD-10-CM | POA: Diagnosis not present

## 2019-02-22 DIAGNOSIS — C61 Malignant neoplasm of prostate: Secondary | ICD-10-CM | POA: Diagnosis not present

## 2019-02-22 DIAGNOSIS — Z1159 Encounter for screening for other viral diseases: Secondary | ICD-10-CM | POA: Diagnosis not present

## 2019-02-22 LAB — HEMOGLOBIN AND HEMATOCRIT, BLOOD
HCT: 41.6 % (ref 39.0–52.0)
HCT: 41.2 % (ref 39.0–52.0)
Hemoglobin: 13.6 g/dL (ref 13.0–17.0)
Hemoglobin: 13.9 g/dL (ref 13.0–17.0)

## 2019-02-22 MED ORDER — BISACODYL 10 MG RE SUPP
10.0000 mg | Freq: Once | RECTAL | Status: AC
  Administered 2019-02-22: 10 mg via RECTAL

## 2019-02-22 MED ORDER — TRAMADOL HCL 50 MG PO TABS
50.0000 mg | ORAL_TABLET | Freq: Four times a day (QID) | ORAL | Status: DC | PRN
  Filled 2019-02-22: qty 1

## 2019-02-22 NOTE — Progress Notes (Signed)
50mg  of ultram wasted with Janett Billow D.

## 2019-02-22 NOTE — Progress Notes (Signed)
Patient ID: Adrian [name], male   DOB: 11-Nov-1960, 58 y.o.   MRN: [id number]  1 Day Post-Op Subjective: The patient is doing well.  No nausea or vomiting. Pain is adequately controlled.  Objective: Vital signs in last 24 hours: Temp:  [97.8 F (36.6 C)-98.8 F (37.1 C)] 98.6 F (37 C) (06/12 0602) Pulse Rate:  [58-83] 73 (06/12 0602) Resp:  [14-21] 15 (06/12 0602) BP: (101-146)/(62-96) 110/75 (06/12 0602) SpO2:  [97 %-100 %] 98 % (06/12 0602)  Intake/Output from previous day: 06/11 0701 - 06/12 0700 In: 4735.7 [I.V.:2635.7; IV Piggyback:2100] Out: 2217 [Urine:1875; Drains:142; Blood:200] Intake/Output this shift: Total I/O In: 1200 [I.V.:1200] Out: 1270 [Urine:1150; Drains:120]  Physical Exam:  General: Alert and oriented. CV: RRR Lungs: Clear bilaterally. GI: Soft, Nondistended. Incisions: Clean, dry, and intact Urine: Clear Extremities: Nontender, no erythema, no edema.  Lab Results: Recent Labs    02/21/19 1209  HGB 16.4  HCT 49.8      Assessment/Plan: POD# 1 s/p robotic prostatectomy.  1) SL IVF 2) Ambulate, Incentive spirometry 3) Transition to oral pain medication 4) Dulcolax suppository 5) D/C pelvic drain 6) Plan for likely discharge later today   [name]. MD   LOS: 0 days   [name] 02/22/2019, 6:58 AM

## 2019-02-22 NOTE — Discharge Summary (Signed)
  Date of admission: 02/21/2019  Date of discharge: 02/22/2019  Admission diagnosis: Prostate Cancer  Discharge diagnosis: Prostate Cancer  History and Physical: For full details, please see admission history and physical. Briefly, Adrian [name] is a 58 y.o. gentleman with localized prostate cancer.  After discussing management/treatment options, he elected to proceed with surgical treatment.  Hospital Course: Adrian [name] was taken to the operating room on 02/21/2019 and underwent a robotic assisted laparoscopic radical prostatectomy. He tolerated this procedure well and without complications. Postoperatively, he was able to be transferred to a regular hospital room following recovery from anesthesia.  He was able to begin ambulating the night of surgery. He remained hemodynamically stable overnight.  He had excellent urine output with appropriately minimal output from his pelvic drain and his pelvic drain was removed on POD #1.  He was transitioned to oral pain medication, tolerated a clear liquid diet, and had met all discharge criteria and was able to be discharged home later on POD#1.  Laboratory values:  Recent Labs    02/21/19 1209 02/22/19 0656 02/22/19 1106  HGB 16.4 13.6 13.9  HCT 49.8 41.6 41.2    Disposition: Home  Discharge instruction: He was instructed to be ambulatory but to refrain from heavy lifting, strenuous activity, or driving. He was instructed on urethral catheter care.  Discharge medications:   Allergies as of 02/22/2019      Reactions   Oxycodone Nausea And Vomiting   Hydrocodone    vomiting      Medication List    TAKE these medications   sulfamethoxazole-trimethoprim 800-160 MG tablet Commonly known as: BACTRIM DS Take 1 tablet by mouth 2 (two) times daily. Start the day prior to foley removal appointment   traMADol 50 MG tablet Commonly known as: Ultram Take 1-2 tablets (50-100 mg total) by mouth every 6 (six) hours as needed for moderate pain or  severe pain.       Followup: He will followup in 1 week for catheter removal and to discuss his surgical pathology results.

## 2019-02-22 NOTE — Plan of Care (Signed)
  Problem: Bowel/Gastric: Goal: Gastrointestinal status for postoperative course will improve Outcome: Progressing   Problem: Pain Management: Goal: General experience of comfort will improve Outcome: Progressing   Problem: Urinary Elimination: Goal: Ability to avoid or minimize complications of infection will improve Outcome: Progressing   Problem: Health Behavior/Discharge Planning: Goal: Ability to manage health-related needs will improve Outcome: Progressing   Problem: Clinical Measurements: Goal: Ability to maintain clinical measurements within normal limits will improve Outcome: Progressing

## 2019-03-18 DIAGNOSIS — R972 Elevated prostate specific antigen [PSA]: Secondary | ICD-10-CM | POA: Diagnosis not present

## 2019-03-18 DIAGNOSIS — C61 Malignant neoplasm of prostate: Secondary | ICD-10-CM | POA: Diagnosis not present

## 2019-03-18 DIAGNOSIS — N390 Urinary tract infection, site not specified: Secondary | ICD-10-CM | POA: Diagnosis not present

## 2019-03-18 DIAGNOSIS — B952 Enterococcus as the cause of diseases classified elsewhere: Secondary | ICD-10-CM | POA: Diagnosis not present

## 2019-03-18 DIAGNOSIS — N200 Calculus of kidney: Secondary | ICD-10-CM | POA: Diagnosis not present

## 2019-03-25 DIAGNOSIS — R351 Nocturia: Secondary | ICD-10-CM | POA: Diagnosis not present

## 2019-03-25 DIAGNOSIS — M62838 Other muscle spasm: Secondary | ICD-10-CM | POA: Diagnosis not present

## 2019-03-25 DIAGNOSIS — N393 Stress incontinence (female) (male): Secondary | ICD-10-CM | POA: Diagnosis not present

## 2019-03-26 DIAGNOSIS — R351 Nocturia: Secondary | ICD-10-CM | POA: Diagnosis not present

## 2019-03-26 DIAGNOSIS — M6281 Muscle weakness (generalized): Secondary | ICD-10-CM | POA: Diagnosis not present

## 2019-03-26 DIAGNOSIS — N393 Stress incontinence (female) (male): Secondary | ICD-10-CM | POA: Diagnosis not present

## 2019-03-26 DIAGNOSIS — M62838 Other muscle spasm: Secondary | ICD-10-CM | POA: Diagnosis not present

## 2019-03-27 DIAGNOSIS — L821 Other seborrheic keratosis: Secondary | ICD-10-CM | POA: Diagnosis not present

## 2019-04-25 DIAGNOSIS — J069 Acute upper respiratory infection, unspecified: Secondary | ICD-10-CM | POA: Diagnosis not present

## 2019-04-25 DIAGNOSIS — R42 Dizziness and giddiness: Secondary | ICD-10-CM | POA: Diagnosis not present

## 2019-05-17 DIAGNOSIS — C61 Malignant neoplasm of prostate: Secondary | ICD-10-CM | POA: Diagnosis not present

## 2019-05-24 DIAGNOSIS — C61 Malignant neoplasm of prostate: Secondary | ICD-10-CM | POA: Diagnosis not present

## 2019-05-24 DIAGNOSIS — N393 Stress incontinence (female) (male): Secondary | ICD-10-CM | POA: Diagnosis not present

## 2019-05-24 DIAGNOSIS — N5201 Erectile dysfunction due to arterial insufficiency: Secondary | ICD-10-CM | POA: Diagnosis not present

## 2019-07-23 ENCOUNTER — Ambulatory Visit: Payer: Federal, State, Local not specified - PPO | Admitting: Internal Medicine

## 2019-08-21 ENCOUNTER — Other Ambulatory Visit: Payer: Self-pay

## 2019-08-22 ENCOUNTER — Ambulatory Visit (INDEPENDENT_AMBULATORY_CARE_PROVIDER_SITE_OTHER): Payer: Federal, State, Local not specified - PPO | Admitting: Internal Medicine

## 2019-08-22 ENCOUNTER — Encounter: Payer: Self-pay | Admitting: Internal Medicine

## 2019-08-22 VITALS — BP 110/62 | HR 75 | Temp 97.8°F | Wt 198.6 lb

## 2019-08-22 DIAGNOSIS — J302 Other seasonal allergic rhinitis: Secondary | ICD-10-CM | POA: Diagnosis not present

## 2019-08-22 DIAGNOSIS — Z23 Encounter for immunization: Secondary | ICD-10-CM

## 2019-08-22 DIAGNOSIS — Z0000 Encounter for general adult medical examination without abnormal findings: Secondary | ICD-10-CM | POA: Diagnosis not present

## 2019-08-22 DIAGNOSIS — C61 Malignant neoplasm of prostate: Secondary | ICD-10-CM

## 2019-08-22 LAB — CBC WITH DIFFERENTIAL/PLATELET
Basophils Absolute: 0.1 10*3/uL (ref 0.0–0.1)
Basophils Relative: 1.1 % (ref 0.0–3.0)
Eosinophils Absolute: 0.1 10*3/uL (ref 0.0–0.7)
Eosinophils Relative: 2.1 % (ref 0.0–5.0)
HCT: 46.2 % (ref 39.0–52.0)
Hemoglobin: 15.6 g/dL (ref 13.0–17.0)
Lymphocytes Relative: 24.2 % (ref 12.0–46.0)
Lymphs Abs: 1.7 10*3/uL (ref 0.7–4.0)
MCHC: 33.8 g/dL (ref 30.0–36.0)
MCV: 86.1 fl (ref 78.0–100.0)
Monocytes Absolute: 0.6 10*3/uL (ref 0.1–1.0)
Monocytes Relative: 8.2 % (ref 3.0–12.0)
Neutro Abs: 4.5 10*3/uL (ref 1.4–7.7)
Neutrophils Relative %: 64.4 % (ref 43.0–77.0)
Platelets: 282 10*3/uL (ref 150.0–400.0)
RBC: 5.37 Mil/uL (ref 4.22–5.81)
RDW: 13.4 % (ref 11.5–15.5)
WBC: 7 10*3/uL (ref 4.0–10.5)

## 2019-08-22 LAB — LIPID PANEL
Cholesterol: 169 mg/dL (ref 0–200)
HDL: 53 mg/dL (ref >39.00)
LDL Cholesterol: 101 mg/dL — ABNORMAL HIGH (ref 0–99)
NonHDL: 115.94
Total CHOL/HDL Ratio: 3
Triglycerides: 74 mg/dL (ref 0.0–149.0)
VLDL: 14.8 mg/dL (ref 0.0–40.0)

## 2019-08-22 LAB — COMPREHENSIVE METABOLIC PANEL
ALT: 17 U/L (ref 0–53)
AST: 19 U/L (ref 0–37)
Albumin: 4.2 g/dL (ref 3.5–5.2)
Alkaline Phosphatase: 75 U/L (ref 39–117)
BUN: 22 mg/dL (ref 6–23)
CO2: 23 mEq/L (ref 19–32)
Calcium: 9.3 mg/dL (ref 8.4–10.5)
Chloride: 107 mEq/L (ref 96–112)
Creatinine, Ser: 1.18 mg/dL (ref 0.40–1.50)
GFR: 62.97 mL/min (ref >60.00)
Glucose, Bld: 108 mg/dL — ABNORMAL HIGH (ref 70–99)
Potassium: 4.4 mEq/L (ref 3.5–5.1)
Sodium: 138 mEq/L (ref 135–145)
Total Bilirubin: 0.7 mg/dL (ref 0.2–1.2)
Total Protein: 6.8 g/dL (ref 6.0–8.3)

## 2019-08-22 LAB — VITAMIN B12: Vitamin B-12: 249 pg/mL (ref 211–911)

## 2019-08-22 LAB — VITAMIN D 25 HYDROXY (VIT D DEFICIENCY, FRACTURES): VITD: 33.57 ng/mL (ref 30.00–100.00)

## 2019-08-22 LAB — TSH: TSH: 0.96 u[IU]/mL (ref 0.35–4.50)

## 2019-08-22 LAB — HEMOGLOBIN A1C: Hgb A1c MFr Bld: 5.5 % (ref 4.6–6.5)

## 2019-08-22 NOTE — Progress Notes (Signed)
Established Patient Office Visit     This visit occurred during the SARS-CoV-2 public health emergency.  Safety protocols were in place, including screening questions prior to the visit, additional usage of staff PPE, and extensive cleaning of exam room while observing appropriate contact time as indicated for disinfecting solutions.    CC/Reason for Visit: Annual preventive exam  HPI: Adrian [name] is a 58 y.o. male who is coming in today for the above mentioned reasons. Past Medical History is significant for: Only significant for prostate cancer who is status post TURP in earlier this year.  He is followed by urology, Dr. Junious Silk.  He has no acute complaints today.  He needs to schedule routine eye and dental care, he is interested in receiving his flu and first shingles vaccine today.  He works around with the [employer] office and has plenty of physical activity on a daily basis.  He had a colonoscopy in 2012.  He has been having some seasonal allergies with mild nasal congestion, drainage and sinus headache.  Has been using Afrin every day.   Past Medical/Surgical History: Past Medical History:  Diagnosis Date  . Dental crowns present   . History of kidney stones   . Plantar fasciitis of left foot 05/2013    Past Surgical History:  Procedure Laterality Date  . BREAST ENHANCEMENT SURGERY Left    asymmetry of chest wall  . CLOSED REDUCTION METACARPAL WITH PERCUTANEOUS PINNING Right 12/22/2004   5th metacarpal  . KNEE ARTHROSCOPY Left   . LITHOTRIPSY    . LYMPHADENECTOMY Bilateral 02/21/2019   Procedure: LYMPHADENECTOMY, PELVIC;  Surgeon: Raynelle Bring, MD;  Location: [facility];  Service: Urology;  Laterality: Bilateral;  . MANDIBLE SURGERY    . PLANTAR FASCIA RELEASE  06/28/2012   Procedure: ENDOSCOPIC PLANTAR FASCIOTOMY;  Surgeon: Ninetta Lights, MD;  Location: [facility];  Service: Orthopedics;  Laterality: Right;  . PLANTAR FASCIA RELEASE Left 06/13/2013   Procedure: ENDOSCOPIC PLANTAR FASCIOTOMY;  Surgeon: Ninetta Lights, MD;  Location: [facility];  Service: Orthopedics;  Laterality: Left;  . ROBOT ASSISTED LAPAROSCOPIC RADICAL PROSTATECTOMY N/A 02/21/2019   Procedure: XI ROBOTIC ASSISTED LAPAROSCOPIC RADICAL PROSTATECTOMY LEVEL 2;  Surgeon: Raynelle Bring, MD;  Location: [facility];  Service: Urology;  Laterality: N/A;    Social History:  reports that he has never smoked. He has never used smokeless tobacco. He reports that he does not drink alcohol or use drugs.  Allergies: Allergies  Allergen Reactions  . Oxycodone Nausea And Vomiting  . Hydrocodone     vomiting    Family History:  No history of heart disease, cancer, stroke that he is aware of  Current Outpatient Medications:  .  sulfamethoxazole-trimethoprim (BACTRIM DS) 800-160 MG tablet, Take 1 tablet by mouth 2 (two) times daily. Start the day prior to foley removal appointment, Disp: 6 tablet, Rfl: 0 .  traMADol (ULTRAM) 50 MG tablet, Take 1-2 tablets (50-100 mg total) by mouth every 6 (six) hours as needed for moderate pain or severe pain., Disp: 20 tablet, Rfl: 0  Review of Systems:  Constitutional: Denies fever, chills, diaphoresis, appetite change and fatigue.  HEENT: Denies photophobia, eye pain, redness, hearing loss, ear pain, congestion, sore throat, rhinorrhea, sneezing, mouth sores, trouble swallowing, neck pain, neck stiffness and tinnitus.   Respiratory: Denies SOB, DOE, cough, chest tightness,  and wheezing.   Cardiovascular: Denies chest pain, palpitations and leg swelling.  Gastrointestinal: Denies nausea, vomiting, abdominal pain, diarrhea,  constipation, blood in stool and abdominal distention.  Genitourinary: Denies dysuria, urgency, frequency, hematuria, flank pain and difficulty urinating.  Endocrine: Denies: hot or cold intolerance, sweats, changes in hair or nails, polyuria, polydipsia. Musculoskeletal: Denies myalgias, back pain, joint swelling,  arthralgias and gait problem.  Skin: Denies pallor, rash and wound.  Neurological: Denies dizziness, seizures, syncope, weakness, light-headedness, numbness and headaches.  Hematological: Denies adenopathy. Easy bruising, personal or family bleeding history  Psychiatric/Behavioral: Denies suicidal ideation, mood changes, confusion, nervousness, sleep disturbance and agitation    Physical Exam: Vitals:   08/22/19 0756  BP: 110/62  Pulse: 75  Temp: 97.8 F (36.6 C)  TempSrc: Temporal  SpO2: 95%  Weight: 198 lb 9.6 oz (90.1 kg)    Body mass index is 27.7 kg/m.   Constitutional: NAD, calm, comfortable Eyes: PERRL, lids and conjunctivae normal ENMT: Mucous membranes are moist. Tympanic membrane is pearly white, no erythema or bulging. Neck: normal, supple, no masses, no thyromegaly Respiratory: clear to auscultation bilaterally, no wheezing, no crackles. Normal respiratory effort. No accessory muscle use.  Cardiovascular: Regular rate and rhythm, no murmurs / rubs / gallops. No extremity edema. 2+ pedal pulses.  Abdomen: no tenderness, no masses palpated. No hepatosplenomegaly. Bowel sounds positive.  Musculoskeletal: no clubbing / cyanosis. No joint deformity upper and lower extremities. Good ROM, no contractures. Normal muscle tone.  Skin: no rashes, lesions, ulcers. No induration Neurologic: CN 2-12 grossly intact. Sensation intact, DTR normal. Strength 5/5 in all 4.  Psychiatric: Normal judgment and insight. Alert and oriented x 3. Normal mood.    Impression and Plan:  Encounter for preventive health examination  -Have advised routine eye and dental care. -He will receive flu vaccine and his first shingles, knows to return before 6 months for his second shingles.  Otherwise immunizations are up-to-date. -Screening labs today. -Healthy lifestyle has been discussed in detail. -He had a colonoscopy in 2012 and is a 10-year callback. -He has already had his prostate removed  due to prostate cancer.  Prostate cancer [facility]) -Status post resection of prostate earlier this year with a PCA of 0, followed by Dr. Junious Silk.  Seasonal allergies -Have advised to discontinue use of Afrin as it may cause rebound nasal congestion. -May use daily antihistamine such as Claritin or Zyrtec in addition to saline nasal spray as needed.    Patient Instructions  -Nice seeing you today!!  -Lab work today; will notify you once results are available.  -May use saline nasal spray as needed for nasal congestion. Can also consider an over the counter antihistamine like claritin or zyrtec 1 tablet daily to get you thru the fall.  -Flu and first shingles vaccine today. Schedule nurse visit in 2-6 months to get your second shingles vaccine.  -Schedule follow up with me in 1 year or sooner as needed.   Preventive Care 34-70 Years Old, Male Preventive care refers to lifestyle choices and visits with your health care provider that can promote health and wellness. This includes:  A yearly physical exam. This is also called an annual well check.  Regular dental and eye exams.  Immunizations.  Screening for certain conditions.  Healthy lifestyle choices, such as eating a healthy diet, getting regular exercise, not using drugs or products that contain nicotine and tobacco, and limiting alcohol use. What can I expect for my preventive care visit? Physical exam Your health care provider will check:  Height and weight. These may be used to calculate body mass index (BMI), which is a  measurement that tells if you are at a healthy weight.  Heart rate and blood pressure.  Your skin for abnormal spots. Counseling Your health care provider may ask you questions about:  Alcohol, tobacco, and drug use.  Emotional well-being.  Home and relationship well-being.  Sexual activity.  Eating habits.  Work and work Statistician. What immunizations do I need?  Influenza (flu) vaccine   This is recommended every year. Tetanus, diphtheria, and pertussis (Tdap) vaccine  You may need a Td booster every 10 years. Varicella (chickenpox) vaccine  You may need this vaccine if you have not already been vaccinated. Zoster (shingles) vaccine  You may need this after age 74. Measles, mumps, and rubella (MMR) vaccine  You may need at least one dose of MMR if you were born in 1957 or later. You may also need a second dose. Pneumococcal conjugate (PCV13) vaccine  You may need this if you have certain conditions and were not previously vaccinated. Pneumococcal polysaccharide (PPSV23) vaccine  You may need one or two doses if you smoke cigarettes or if you have certain conditions. Meningococcal conjugate (MenACWY) vaccine  You may need this if you have certain conditions. Hepatitis A vaccine  You may need this if you have certain conditions or if you travel or work in places where you may be exposed to hepatitis A. Hepatitis B vaccine  You may need this if you have certain conditions or if you travel or work in places where you may be exposed to hepatitis B. Haemophilus influenzae type b (Hib) vaccine  You may need this if you have certain risk factors. Human papillomavirus (HPV) vaccine  If recommended by your health care provider, you may need three doses over 6 months. You may receive vaccines as individual doses or as more than one vaccine together in one shot (combination vaccines). Talk with your health care provider about the risks and benefits of combination vaccines. What tests do I need? Blood tests  Lipid and cholesterol levels. These may be checked every 5 years, or more frequently if you are over 52 years old.  Hepatitis C test.  Hepatitis B test. Screening  Lung cancer screening. You may have this screening every year starting at age 69 if you have a 30-pack-year history of smoking and currently smoke or have quit within the past 15 years.  Prostate  cancer screening. Recommendations will vary depending on your family history and other risks.  Colorectal cancer screening. All adults should have this screening starting at age 53 and continuing until age 8. Your health care provider may recommend screening at age 51 if you are at increased risk. You will have tests every 1-10 years, depending on your results and the type of screening test.  Diabetes screening. This is done by checking your blood sugar (glucose) after you have not eaten for a while (fasting). You may have this done every 1-3 years.  Sexually transmitted disease (STD) testing. Follow these instructions at home: Eating and drinking  Eat a diet that includes fresh fruits and vegetables, whole grains, lean protein, and low-fat dairy products.  Take vitamin and mineral supplements as recommended by your health care provider.  Do not drink alcohol if your health care provider tells you not to drink.  If you drink alcohol: ? Limit how much you have to 0-2 drinks a day. ? Be aware of how much alcohol is in your drink. In the [place] one drink equals one 12 oz bottle of beer (355 mL),  one 5 oz glass of wine (148 mL), or one 1 oz glass of hard liquor (44 mL). Lifestyle  Take daily care of your teeth and gums.  Stay active. Exercise for at least 30 minutes on 5 or more days each week.  Do not use any products that contain nicotine or tobacco, such as cigarettes, e-cigarettes, and chewing tobacco. If you need help quitting, ask your health care provider.  If you are sexually active, practice safe sex. Use a condom or other form of protection to prevent STIs (sexually transmitted infections).  Talk with your health care provider about taking a low-dose aspirin every day starting at age 50. What's next?  Go to your health care provider once a year for a well check visit.  Ask your health care provider how often you should have your eyes and teeth checked.  Stay up to date on  all vaccines. This information is not intended to replace advice given to you by your health care provider. Make sure you discuss any questions you have with your health care provider. Document Released: 09/25/2015 Document Revised: 08/23/2018 Document Reviewed: 08/23/2018 Elsevier Patient Education  2020 [place], [place] [facility]

## 2019-08-22 NOTE — Patient Instructions (Signed)
-Nice seeing you today!!  -Lab work today; will notify you once results are available.  -May use saline nasal spray as needed for nasal congestion. Can also consider an over the counter antihistamine like claritin or zyrtec 1 tablet daily to get you thru the fall.  -Flu and first shingles vaccine today. Schedule nurse visit in 2-6 months to get your second shingles vaccine.  -Schedule follow up with me in 1 year or sooner as needed.   Preventive Care 28-25 Years Old, Male Preventive care refers to lifestyle choices and visits with your health care provider that can promote health and wellness. This includes:  A yearly physical exam. This is also called an annual well check.  Regular dental and eye exams.  Immunizations.  Screening for certain conditions.  Healthy lifestyle choices, such as eating a healthy diet, getting regular exercise, not using drugs or products that contain nicotine and tobacco, and limiting alcohol use. What can I expect for my preventive care visit? Physical exam Your health care provider will check:  Height and weight. These may be used to calculate body mass index (BMI), which is a measurement that tells if you are at a healthy weight.  Heart rate and blood pressure.  Your skin for abnormal spots. Counseling Your health care provider may ask you questions about:  Alcohol, tobacco, and drug use.  Emotional well-being.  Home and relationship well-being.  Sexual activity.  Eating habits.  Work and work Statistician. What immunizations do I need?  Influenza (flu) vaccine  This is recommended every year. Tetanus, diphtheria, and pertussis (Tdap) vaccine  You may need a Td booster every 10 years. Varicella (chickenpox) vaccine  You may need this vaccine if you have not already been vaccinated. Zoster (shingles) vaccine  You may need this after age 12. Measles, mumps, and rubella (MMR) vaccine  You may need at least one dose of MMR if you  were born in 1957 or later. You may also need a second dose. Pneumococcal conjugate (PCV13) vaccine  You may need this if you have certain conditions and were not previously vaccinated. Pneumococcal polysaccharide (PPSV23) vaccine  You may need one or two doses if you smoke cigarettes or if you have certain conditions. Meningococcal conjugate (MenACWY) vaccine  You may need this if you have certain conditions. Hepatitis A vaccine  You may need this if you have certain conditions or if you travel or work in places where you may be exposed to hepatitis A. Hepatitis B vaccine  You may need this if you have certain conditions or if you travel or work in places where you may be exposed to hepatitis B. Haemophilus influenzae type b (Hib) vaccine  You may need this if you have certain risk factors. Human papillomavirus (HPV) vaccine  If recommended by your health care provider, you may need three doses over 6 months. You may receive vaccines as individual doses or as more than one vaccine together in one shot (combination vaccines). Talk with your health care provider about the risks and benefits of combination vaccines. What tests do I need? Blood tests  Lipid and cholesterol levels. These may be checked every 5 years, or more frequently if you are over 53 years old.  Hepatitis C test.  Hepatitis B test. Screening  Lung cancer screening. You may have this screening every year starting at age 82 if you have a 30-pack-year history of smoking and currently smoke or have quit within the past 15 years.  Prostate cancer  screening. Recommendations will vary depending on your family history and other risks.  Colorectal cancer screening. All adults should have this screening starting at age 72 and continuing until age 37. Your health care provider may recommend screening at age 40 if you are at increased risk. You will have tests every 1-10 years, depending on your results and the type of  screening test.  Diabetes screening. This is done by checking your blood sugar (glucose) after you have not eaten for a while (fasting). You may have this done every 1-3 years.  Sexually transmitted disease (STD) testing. Follow these instructions at home: Eating and drinking  Eat a diet that includes fresh fruits and vegetables, whole grains, lean protein, and low-fat dairy products.  Take vitamin and mineral supplements as recommended by your health care provider.  Do not drink alcohol if your health care provider tells you not to drink.  If you drink alcohol: ? Limit how much you have to 0-2 drinks a day. ? Be aware of how much alcohol is in your drink. In the [place] one drink equals one 12 oz bottle of beer (355 mL), one 5 oz glass of wine (148 mL), or one 1 oz glass of hard liquor (44 mL). Lifestyle  Take daily care of your teeth and gums.  Stay active. Exercise for at least 30 minutes on 5 or more days each week.  Do not use any products that contain nicotine or tobacco, such as cigarettes, e-cigarettes, and chewing tobacco. If you need help quitting, ask your health care provider.  If you are sexually active, practice safe sex. Use a condom or other form of protection to prevent STIs (sexually transmitted infections).  Talk with your health care provider about taking a low-dose aspirin every day starting at age 56. What's next?  Go to your health care provider once a year for a well check visit.  Ask your health care provider how often you should have your eyes and teeth checked.  Stay up to date on all vaccines. This information is not intended to replace advice given to you by your health care provider. Make sure you discuss any questions you have with your health care provider. Document Released: 09/25/2015 Document Revised: 08/23/2018 Document Reviewed: 08/23/2018 Elsevier Patient Education  2020 [employer].

## 2019-08-22 NOTE — Addendum Note (Signed)
Addended by: Suzette Battiest on: 08/22/2019 08:36 AM   Modules accepted: Orders

## 2019-08-22 NOTE — Addendum Note (Signed)
Addended by: Rebecca Eaton on: 08/22/2019 08:39 AM   Modules accepted: Orders

## 2019-09-09 ENCOUNTER — Encounter: Payer: Self-pay | Admitting: Emergency Medicine

## 2019-09-09 ENCOUNTER — Other Ambulatory Visit: Payer: Self-pay

## 2019-09-09 ENCOUNTER — Ambulatory Visit
Admission: EM | Admit: 2019-09-09 | Discharge: 2019-09-09 | Disposition: A | Discharge status: Home / Self Care | Payer: Federal, State, Local not specified - PPO | Attending: Emergency Medicine | Admitting: Emergency Medicine

## 2019-09-09 DIAGNOSIS — L247 Irritant contact dermatitis due to plants, except food: Secondary | ICD-10-CM

## 2019-09-09 MED ORDER — PREDNISONE 10 MG (21) PO TBPK: ORAL_TABLET | Freq: Every day | ORAL | 0 refills | Status: DC

## 2019-09-09 MED ORDER — TRIAMCINOLONE ACETONIDE 0.5 % EX OINT: 1.0000 "application " | TOPICAL_OINTMENT | Freq: Two times a day (BID) | CUTANEOUS | 0 refills | Status: DC

## 2019-09-09 NOTE — Discharge Instructions (Addendum)
Wash all clothing/bedding in hot water. Take steroid as directed: day 1: 6 tabs, day 2: 5  tabs, then 4, 3, 2, 1 Apply steroid cream to lesion/rash only: 2 times a day for 1 week. Return for worsening rash, redness spreading, discharge becoming malodorous, or fever.

## 2019-09-09 NOTE — ED Provider Notes (Signed)
[facility]    CSN: [id number] Arrival date & time: 09/09/19  1619      History   Chief Complaint Chief Complaint  Patient presents with  . Rash    HPI Adrian [name] is a 58 y.o. male presenting for left antecubital rash since Thursday.  States he was raking many years the day prior.  Endorsing pruritus, serosanguineous discharge.  Has tried calamine without significant relief.   Past Medical History:  Diagnosis Date  . Dental crowns present   . History of kidney stones   . Plantar fasciitis of left foot 05/2013    Patient Active Problem List   Diagnosis Date Noted  . Prostate cancer ([place]) 02/21/2019  . Carbuncle, neck 09/20/2018  . Pain in joint of left shoulder 11/20/2017  . Impingement syndrome of left shoulder region 11/20/2017  . Pain in right knee 11/20/2017  . Routine general medical examination at a health care facility 12/16/2013  . Elevated PSA 12/16/2013  . Nephrolithiasis 07/13/2013  . HEMATURIA, HX OF 03/24/2008  . BICIPITAL TENOSYNOVITIS 09/27/2007    Past Surgical History:  Procedure Laterality Date  . BREAST ENHANCEMENT SURGERY Left    asymmetry of chest wall  . CLOSED REDUCTION METACARPAL WITH PERCUTANEOUS PINNING Right 12/22/2004   5th metacarpal  . KNEE ARTHROSCOPY Left   . LITHOTRIPSY    . LYMPHADENECTOMY Bilateral 02/21/2019   Procedure: LYMPHADENECTOMY, PELVIC;  Surgeon: Raynelle Bring, MD;  Location: [facility];  Service: Urology;  Laterality: Bilateral;  . MANDIBLE SURGERY    . PLANTAR FASCIA RELEASE  06/28/2012   Procedure: ENDOSCOPIC PLANTAR FASCIOTOMY;  Surgeon: Ninetta Lights, MD;  Location: [place];  Service: Orthopedics;  Laterality: Right;  . PLANTAR FASCIA RELEASE Left 06/13/2013   Procedure: ENDOSCOPIC PLANTAR FASCIOTOMY;  Surgeon: Ninetta Lights, MD;  Location: [place];  Service: Orthopedics;  Laterality: Left;  . ROBOT ASSISTED LAPAROSCOPIC RADICAL PROSTATECTOMY N/A 02/21/2019   Procedure: XI ROBOTIC ASSISTED LAPAROSCOPIC RADICAL PROSTATECTOMY LEVEL 2;  Surgeon: Raynelle Bring, MD;  Location: [facility];  Service: Urology;  Laterality: N/A;       Home Medications    Prior to Admission medications   Medication Sig Start Date End Date Taking? Authorizing Provider  predniSONE (STERAPRED UNI-PAK 21 TAB) 10 MG (21) TBPK tablet Take by mouth daily. Take steroid taper as written 09/09/19   Hall-Potvin, [place], PA-C  triamcinolone ointment (KENALOG) 0.5 % Apply 1 application topically 2 (two) times daily. 09/09/19   Hall-Potvin, [place], PA-C    Family History History reviewed. No pertinent family history.  Social History Social History   Tobacco Use  . Smoking status: Never Smoker  . Smokeless tobacco: Never Used  Substance Use Topics  . Alcohol use: No  . Drug use: No     Allergies   Oxycodone and Hydrocodone   Review of Systems Review of Systems  Constitutional: Negative for fatigue and fever.  Respiratory: Negative for cough and shortness of breath.   Cardiovascular: Negative for chest pain and palpitations.  Gastrointestinal: Negative for abdominal pain, diarrhea and vomiting.  Musculoskeletal: Negative for arthralgias and myalgias.  Skin: Positive for rash. Negative for wound.  Neurological: Negative for speech difficulty and headaches.  All other systems reviewed and are negative.    Physical Exam Triage Vital Signs ED Triage Vitals  Enc Vitals Group     BP 09/09/19 1731 122/74     Pulse Rate 09/09/19 1731 67     Resp 09/09/19 1731  16     Temp 09/09/19 1731 98.8 F (37.1 C)     Temp Source 09/09/19 1731 Temporal     SpO2 09/09/19 1731 94 %     Weight --      Height --      Head Circumference --      Peak Flow --      Pain Score 09/09/19 1733 0     Pain Loc --      Pain Edu? --      Excl. in [place]? --    No data found.  Updated Vital Signs BP 122/74 (BP Location: Left Arm)   Pulse 67   Temp 98.8 F (37.1 C) (Temporal)   Resp  16   SpO2 94%   Visual Acuity Right Eye Distance:   Left Eye Distance:   Bilateral Distance:    Right Eye Near:   Left Eye Near:    Bilateral Near:     Physical Exam Constitutional:      General: He is not in acute distress. HENT:     Head: Normocephalic and atraumatic.  Eyes:     General: No scleral icterus.    Pupils: Pupils are equal, round, and reactive to light.  Cardiovascular:     Rate and Rhythm: Normal rate.  Pulmonary:     Effort: Pulmonary effort is normal. No respiratory distress.     Breath sounds: No wheezing.  Skin:    Coloration: Skin is not jaundiced or pale.     Findings: Rash present.     Comments: 10 cm area of plain dermatitis with serosanguineous discharge.  No open wound, tenderness palpation, surrounding erythema no streaking.  Neurological:     Mental Status: He is alert and oriented to person, place, and time.      UC Treatments / Results  Labs (all labs ordered are listed, but only abnormal results are displayed) Labs Reviewed - No data to display  EKG   Radiology No results found.  Procedures Procedures (including critical care time)  Medications Ordered in UC Medications - No data to display  Initial Impression / Assessment and Plan / UC Course  I have reviewed the triage vital signs and the nursing notes.  Pertinent labs & imaging results that were available during my care of the patient were reviewed by me and considered in my medical decision making (see chart for details).    Patient afebrile, nontoxic.  H&P consistent with irritant contact dermatitis, likely from plants.  Will start prednisone, and triamcinolone for particularly pruritic lesions.  Return precautions discussed, patient verbalized understanding and is agreeable to plan. Final Clinical Impressions(s) / UC Diagnoses   Final diagnoses:  Irritant contact dermatitis due to plants, except food     Discharge Instructions     Wash all clothing/bedding in hot  water. Take steroid as directed: day 1: 6 tabs, day 2: 5  tabs, then 4, 3, 2, 1 Apply steroid cream to lesion/rash only: 2 times a day for 1 week. Return for worsening rash, redness spreading, discharge becoming malodorous, or fever.    ED Prescriptions    Medication Sig Dispense Auth. Provider   predniSONE (STERAPRED UNI-PAK 21 TAB) 10 MG (21) TBPK tablet Take by mouth daily. Take steroid taper as written 21 tablet Hall-Potvin, [place], PA-C   triamcinolone ointment (KENALOG) 0.5 % Apply 1 application topically 2 (two) times daily. 30 g Hall-Potvin, [place], PA-C     PDMP not reviewed this encounter.   Hall-Potvin,  [place], PA-C 09/11/19 2215

## 2019-09-09 NOTE — ED Triage Notes (Signed)
Pt presents to [facility] for assessment of 1 week of rash to left AC, which has begun to ooze and become more itchy.  Patient states he had the shingles booster 2 weeks ago.

## 2019-10-03 DIAGNOSIS — R110 Nausea: Secondary | ICD-10-CM | POA: Diagnosis not present

## 2019-10-03 DIAGNOSIS — K591 Functional diarrhea: Secondary | ICD-10-CM | POA: Diagnosis not present

## 2019-10-22 ENCOUNTER — Other Ambulatory Visit: Payer: Self-pay

## 2019-10-23 ENCOUNTER — Ambulatory Visit (INDEPENDENT_AMBULATORY_CARE_PROVIDER_SITE_OTHER): Payer: Federal, State, Local not specified - PPO | Admitting: *Deleted

## 2019-10-23 DIAGNOSIS — Z23 Encounter for immunization: Secondary | ICD-10-CM

## 2019-10-23 NOTE — Progress Notes (Signed)
Per orders of Dr. Jerilee Hoh, injection of Shingix given by Westley Hummer. Patient tolerated injection well.

## 2019-11-29 DIAGNOSIS — C61 Malignant neoplasm of prostate: Secondary | ICD-10-CM | POA: Diagnosis not present

## 2019-12-19 ENCOUNTER — Ambulatory Visit: Payer: Federal, State, Local not specified - PPO | Attending: Internal Medicine

## 2019-12-19 DIAGNOSIS — Z23 Encounter for immunization: Secondary | ICD-10-CM

## 2019-12-19 NOTE — Progress Notes (Signed)
   Covid-19 Vaccination Clinic  Name:  Adrian [name]    MRN: [id number] DOB: 09-May-1961  12/19/2019  [name] was observed post Covid-19 immunization for 15 minutes without incident. He was provided with Vaccine Information Sheet and instruction to access the V-Safe system.   [name] was instructed to call 911 with any severe reactions post vaccine: [name] Difficulty breathing  . Swelling of face and throat  . A fast heartbeat  . A bad rash all over body  . Dizziness and weakness   Immunizations Administered    Name Date Dose VIS Date Route   Pfizer COVID-19 Vaccine 12/19/2019  8:30 AM 0.3 mL 08/23/2019 Intramuscular   Manufacturer: [employer], [employer]   Lot: [id number]   [place]: [id number]

## 2020-01-03 DIAGNOSIS — N5201 Erectile dysfunction due to arterial insufficiency: Secondary | ICD-10-CM | POA: Diagnosis not present

## 2020-01-03 DIAGNOSIS — C61 Malignant neoplasm of prostate: Secondary | ICD-10-CM | POA: Diagnosis not present

## 2020-01-03 DIAGNOSIS — N393 Stress incontinence (female) (male): Secondary | ICD-10-CM | POA: Diagnosis not present

## 2020-01-14 ENCOUNTER — Ambulatory Visit: Payer: Federal, State, Local not specified - PPO | Attending: Internal Medicine

## 2020-01-14 DIAGNOSIS — Z23 Encounter for immunization: Secondary | ICD-10-CM

## 2020-01-14 NOTE — Progress Notes (Signed)
   Covid-19 Vaccination Clinic  Name:  Adrian [name]    MRN: [id number] DOB: 1960/12/24  01/14/2020  [name] was observed post Covid-19 immunization for 15 minutes without incident. He was provided with Vaccine Information Sheet and instruction to access the V-Safe system.   [name] was instructed to call 911 with any severe reactions post vaccine: [name] Difficulty breathing  . Swelling of face and throat  . A fast heartbeat  . A bad rash all over body  . Dizziness and weakness   Immunizations Administered    Name Date Dose VIS Date Route   Pfizer COVID-19 Vaccine 01/14/2020  8:59 AM 0.3 mL 11/06/2018 Intramuscular   Manufacturer: [place]   Lot: [id number]   [place]: [id number]

## 2020-06-04 ENCOUNTER — Other Ambulatory Visit: Payer: Self-pay

## 2020-06-04 ENCOUNTER — Ambulatory Visit (INDEPENDENT_AMBULATORY_CARE_PROVIDER_SITE_OTHER): Payer: Federal, State, Local not specified - PPO | Admitting: Internal Medicine

## 2020-06-04 VITALS — BP 110/50 | HR 75 | Temp 97.7°F | Wt 203.7 lb

## 2020-06-04 DIAGNOSIS — R0683 Snoring: Secondary | ICD-10-CM

## 2020-06-04 DIAGNOSIS — M79671 Pain in right foot: Secondary | ICD-10-CM | POA: Diagnosis not present

## 2020-06-04 NOTE — Progress Notes (Signed)
Established Patient Office Visit     This visit occurred during the SARS-CoV-2 public health emergency.  Safety protocols were in place, including screening questions prior to the visit, additional usage of staff PPE, and extensive cleaning of exam room while observing appropriate contact time as indicated for disinfecting solutions.    CC/Reason for Visit: Discuss a few acute concerns  HPI: Adrian [name] is a 59 y.o. male who is coming in today for the above mentioned reasons.  He is here today to discuss mainly 2 issues:  1.  His wife states that his snoring is "out of control".  He wakes up frequently at night, wakes up not feeling well rested.  He has tried over-the-counter nose strips, mouthguard without success.  2.He feels his plantar fasciitis is flaring up again.  It is especially bad in his right foot.  He has had surgery bilaterally for this.  He has right heel pain that is worse when he wakes up in the morning and gets better throughout the day.   Past Medical/Surgical History: Past Medical History:  Diagnosis Date   Dental crowns present    History of kidney stones    Plantar fasciitis of left foot 05/2013    Past Surgical History:  Procedure Laterality Date   BREAST ENHANCEMENT SURGERY Left    asymmetry of chest wall   CLOSED REDUCTION METACARPAL WITH PERCUTANEOUS PINNING Right 12/22/2004   5th metacarpal   KNEE ARTHROSCOPY Left    LITHOTRIPSY     LYMPHADENECTOMY Bilateral 02/21/2019   Procedure: LYMPHADENECTOMY, PELVIC;  Surgeon: Raynelle Bring, MD;  Location: [facility];  Service: Urology;  Laterality: Bilateral;   MANDIBLE SURGERY     PLANTAR FASCIA RELEASE  06/28/2012   Procedure: ENDOSCOPIC PLANTAR FASCIOTOMY;  Surgeon: Ninetta Lights, MD;  Location: [employer];  Service: Orthopedics;  Laterality: Right;   PLANTAR FASCIA RELEASE Left 06/13/2013   Procedure: ENDOSCOPIC PLANTAR FASCIOTOMY;  Surgeon: Ninetta Lights, MD;  Location: [place];  Service: Orthopedics;  Laterality: Left;   ROBOT ASSISTED LAPAROSCOPIC RADICAL PROSTATECTOMY N/A 02/21/2019   Procedure: XI ROBOTIC ASSISTED LAPAROSCOPIC RADICAL PROSTATECTOMY LEVEL 2;  Surgeon: Raynelle Bring, MD;  Location: [facility];  Service: Urology;  Laterality: N/A;    Social History:  reports that he has never smoked. He has never used smokeless tobacco. He reports that he does not drink alcohol and does not use drugs.  Allergies: Allergies  Allergen Reactions   Oxycodone Nausea And Vomiting   Hydrocodone     vomiting    Family History:  No history of heart disease, cancer, stroke that he is aware of No current outpatient medications on file.  Review of Systems:  Constitutional: Denies fever, chills, diaphoresis, appetite change and fatigue.  HEENT: Denies photophobia, eye pain, redness, hearing loss, ear pain, congestion, sore throat, rhinorrhea, sneezing, mouth sores, trouble swallowing, neck pain, neck stiffness and tinnitus.   Respiratory: Denies SOB, DOE, cough, chest tightness,  and wheezing.   Cardiovascular: Denies chest pain, palpitations and leg swelling.  Gastrointestinal: Denies nausea, vomiting, abdominal pain, diarrhea, constipation, blood in stool and abdominal distention.  Genitourinary: Denies dysuria, urgency, frequency, hematuria, flank pain and difficulty urinating.  Endocrine: Denies: hot or cold intolerance, sweats, changes in hair or nails, polyuria, polydipsia. Musculoskeletal: Denies myalgias, back pain, joint swelling, arthralgias and gait problem.  Skin: Denies pallor, rash and wound.  Neurological: Denies dizziness, seizures, syncope, weakness, light-headedness, numbness and headaches.  Hematological: Denies  adenopathy. Easy bruising, personal or family bleeding history  Psychiatric/Behavioral: Denies suicidal ideation, mood changes, confusion, nervousness, sleep disturbance and agitation    Physical Exam: Vitals:    06/04/20 0918  BP: (!) 110/50  Pulse: 75  Temp: 97.7 F (36.5 C)  TempSrc: Oral  SpO2: 95%  Weight: 203 lb 11.2 oz (92.4 kg)    Body mass index is 28.41 kg/m.   Constitutional: NAD, calm, comfortable Eyes: PERRL, lids and conjunctivae normal ENMT: Mucous membranes are moist.  Respiratory: clear to auscultation bilaterally, no wheezing, no crackles. Normal respiratory effort. No accessory muscle use.  Cardiovascular: Regular rate and rhythm, no murmurs / rubs / gallops. No extremity edema.   Musculoskeletal: no clubbing / cyanosis. No joint deformity upper and lower extremities. Good ROM, no contractures. Normal muscle tone.  Psychiatric: Normal judgment and insight. Alert and oriented x 3. Normal mood.    Impression and Plan:  Snoring  -He declines referral for sleep medicine today, he will see his dentist regarding a mouthguard.  Pain of right heel -Suspect this is a flareup of his plantar fasciitis, have advised supportive footwear, massages with frozen water bottle, if continues follow-up with Ortho/podiatry.     Lelon Frohlich, MD [facility] at [facility]

## 2020-06-14 DIAGNOSIS — Z20828 Contact with and (suspected) exposure to other viral communicable diseases: Secondary | ICD-10-CM | POA: Diagnosis not present

## 2020-06-30 DIAGNOSIS — N3001 Acute cystitis with hematuria: Secondary | ICD-10-CM | POA: Diagnosis not present

## 2020-06-30 DIAGNOSIS — R319 Hematuria, unspecified: Secondary | ICD-10-CM | POA: Diagnosis not present

## 2020-07-02 DIAGNOSIS — R310 Gross hematuria: Secondary | ICD-10-CM | POA: Diagnosis not present

## 2020-07-02 DIAGNOSIS — N202 Calculus of kidney with calculus of ureter: Secondary | ICD-10-CM | POA: Diagnosis not present

## 2020-07-02 DIAGNOSIS — Z9882 Breast implant status: Secondary | ICD-10-CM | POA: Diagnosis not present

## 2020-07-02 DIAGNOSIS — N200 Calculus of kidney: Secondary | ICD-10-CM | POA: Diagnosis not present

## 2020-07-02 DIAGNOSIS — Z8546 Personal history of malignant neoplasm of prostate: Secondary | ICD-10-CM | POA: Diagnosis not present

## 2020-07-02 DIAGNOSIS — C61 Malignant neoplasm of prostate: Secondary | ICD-10-CM | POA: Diagnosis not present

## 2020-07-08 ENCOUNTER — Other Ambulatory Visit (HOSPITAL_COMMUNITY)
Admission: RE | Admit: 2020-07-08 | Discharge: 2020-07-08 | Disposition: A | Discharge status: Home / Self Care | Payer: Federal, State, Local not specified - PPO | Source: Ambulatory Visit | Attending: Urology | Admitting: Urology

## 2020-07-08 ENCOUNTER — Other Ambulatory Visit: Payer: Self-pay | Admitting: Urology

## 2020-07-08 DIAGNOSIS — Z01812 Encounter for preprocedural laboratory examination: Secondary | ICD-10-CM | POA: Insufficient documentation

## 2020-07-08 DIAGNOSIS — Z20822 Contact with and (suspected) exposure to covid-19: Secondary | ICD-10-CM | POA: Insufficient documentation

## 2020-07-08 LAB — SARS CORONAVIRUS 2 (TAT 6-24 HRS): SARS Coronavirus 2: NEGATIVE

## 2020-07-08 NOTE — Progress Notes (Signed)
Pre op phone call completed. PT is aware he is NPO after midnight.  He does not take any medications at this time.  Will arrive at 1030.  He is aware that he is to quarantine until appt tomorrow.

## 2020-07-08 NOTE — H&P (Signed)
Office Visit Report     07/02/2020   --------------------------------------------------------------------------------   Adrian [name]  MRN: [id number]  DOB: 1961-09-02, 59 year old Male  SSN: [id number]   PRIMARY CARE:  Dellis Filbert A. Sherren Mocha (retired), MD  REFERRING:  Dellis Filbert A. Sherren Mocha (retired), MD  PROVIDER:  Festus Aloe, M.D.  TREATING:  Azucena Fallen, NP  LOCATION:  [facility] [phone]     --------------------------------------------------------------------------------   CC/HPI: Locally advanced prostate cancer   01/03/20: He returns today 10 months out from his radical prostatectomy. He has recovered uneventfully and has almost regained full continence. He still wears 1 pad per day for safety protective purposes. He admits that he has not been performing his pelvic floor exercises recently. With regard to erectile function, he did have hallucinations with sildenafil and stop taking this at his last appointment. Making attempts at erections, he has been able to get engorgement but no erections adequate for intercourse. He has not yet pursued a vacuum erection device.   07/02/20: Patient with above-noted history. He presents today with complaints of gross hematuria and left flank pain. He states that symptoms began approximately 1 week ago. He states that pain in his left flank was mild and persisted for few days and has now resolved. He denies any current flank or abdominal pain. He does continue to have intermittent episodes of hematuria. He denies any passage of clot material. He denies any significant voiding symptomatology. No complaints of dysuria. He continues to feel he voids with an adequate stream and empties efficiently. He denies bothersome frequency and urgency from his baseline. He continues to have some mild incontinence with standing, which is minimally bothersome. He denies any new bone pain or recent unintentional weight loss. He does continue to have ongoing  issues with erectile function. He has not tried Tadalifil, as he is concerned about possible side effects. He states that he has made a call to the company for consideration of vacuum erection device. He was seen at urgent care and was told he had hematuria. He was empirically started on antibiotic, but he is unsure what antibiotic. Urine culture ultimately resulted as negative.     ALLERGIES: Oxycodone sildenafil - Dizziness (Severe)    MEDICATIONS: Tadalafil 20 mg tablet 1 tablet PO prn as directed     GU PSH: ESWL - 2009, 2009 Laparoscopy; Lymphadenectomy - 02/21/2019 Prostate Needle Biopsy - 2020 Robotic Radical Prostatectomy - 02/21/2019       PSH Notes: Foot Surgery, Lithotripsy, Knee Arthroscopy, Lithotripsy   NON-GU PSH: Knee Arthroscopy, Left Partial Remove Foot Fascia, Bilateral Surgical Pathology, Gross And Microscopic Examination For Prostate Needle - 2020     GU PMH: ED due to arterial insufficiency - 02/27/2019 Stress Incontinence - 01/17/2019, - 2020 Prostate Cancer - 2020 Elevated PSA - 2020, - 08/28/2018 History of urolithiasis, Nephrolithiasis - 2014 Nocturia, Nocturia - 2014 Other microscopic hematuria, Microscopic hematuria - 2014 Renal calculus, Kidney stone on right side - 2014      PMH Notes:   1) Prostate cancer: He is s/p a BNS RAL radical prostatectomy and BPLND on 02/21/19.   Diagnosis: pT3a N0 Mx, Gleason 3+4=7 adenocarcinoma with negative surgical margins  Pretreatment PSA: 7.9  Pretreatment SHIM score: 25   NON-GU PMH: None   FAMILY HISTORY: Death of family member - Father Kidney Stones - Runs in Family Lung Cancer - Father    Notes: 1 son; 1 daughter   SOCIAL HISTORY: Marital Status: Married Preferred Language:  English; Ethnicity: Not Hispanic Or Latino; Race: White Current Smoking Status: Patient has never smoked.   Tobacco Use Assessment Completed: Used Tobacco in last 30 days? Does not use smokeless tobacco. Has never drank.  Drinks  3 caffeinated drinks per day. Patient's occupation is/was [place] [employer].     Notes: Former smoker, Tobacco Use, Marital History - Currently Married, Occupation:, Alcohol Use, Caffeine Use   REVIEW OF SYSTEMS:    GU Review Male:   Patient denies frequent urination, hard to postpone urination, burning/ pain with urination, get up at night to urinate, leakage of urine, stream starts and stops, trouble starting your stream, have to strain to urinate , erection problems, and penile pain.  Gastrointestinal (Upper):   Patient denies nausea, vomiting, and indigestion/ heartburn.  Gastrointestinal (Lower):   Patient denies constipation and diarrhea.  Constitutional:   Patient denies fever, night sweats, weight loss, and fatigue.  Skin:   Patient denies skin rash/ lesion and itching.  Eyes:   Patient denies blurred vision and double vision.  Ears/ Nose/ Throat:   Patient denies sore throat and sinus problems.  Hematologic/Lymphatic:   Patient denies swollen glands and easy bruising.  Cardiovascular:   Patient denies leg swelling and chest pains.  Respiratory:   Patient denies cough and shortness of breath.  Endocrine:   Patient denies excessive thirst.  Musculoskeletal:   Patient denies back pain and joint pain.  Neurological:   Patient denies headaches and dizziness.  Psychologic:   Patient denies depression and anxiety.   Notes: hematuria     VITAL SIGNS:      07/02/2020 08:27 AM  Weight 200 lb / 90.72 kg  Height 71 in / 180.34 cm  BP 131/88 mmHg  Heart Rate 76 /min  Temperature 97.8 F / 36.5 C  BMI 27.9 kg/m   MULTI-SYSTEM PHYSICAL EXAMINATION:    Constitutional: Well-nourished. No physical deformities. Normally developed. Good grooming.  Respiratory: No labored breathing, no use of accessory muscles.   Cardiovascular: Normal temperature, normal extremity pulses, no swelling, no varicosities.  Neurologic / Psychiatric: Oriented to time, oriented to place, oriented to person. No  depression, no anxiety, no agitation.  Gastrointestinal: No mass, no tenderness, no rigidity, non obese abdomen. No CVAT.  Musculoskeletal: Normal gait and station of head and neck.     Complexity of Data:  Lab Test Review:   PSA  Records Review:   Previous Patient Records  Urine Test Review:   Urinalysis  X-Ray Review: C.T. Abdomen/Pelvis: Reviewed Films. Reviewed Report.     12/03/19 05/22/19 11/05/18 08/28/18  PSA  Total PSA <0.015 ng/mL <0.015 ng/mL 8.09 ng/mL 6.94 ng/mL  Free PSA   0.99 ng/mL 0.99 ng/mL  % Free PSA   12 % PSA 14 % PSA    PROCEDURES:         C.T. Urogram - [id number]  IMPRESSION:  1. 10 x 6.5 mm calculus in the right renal pelvis near the UPJ which  could be intermittently obstructing.  2. Small bilateral renal calculi.  3. Status post prostatectomy. No findings for recurrent tumor or  metastatic disease.       Patient confirmed No Neulasta OnPro Device.         Urinalysis w/Scope Dipstick Dipstick Cont'd Micro  Color: Yellow Bilirubin: Neg mg/dL WBC/hpf: 0 - 5/hpf  Appearance: Clear Ketones: Neg mg/dL RBC/hpf: 10 - 20/hpf  Specific Gravity: 1.020 Blood: 3+ ery/uL Bacteria: Rare (0-9/hpf)  pH: <=5.0 Protein: Neg mg/dL Cystals: NS (Not Seen)  Glucose: Neg mg/dL Urobilinogen: 0.2 mg/dL Casts: NS (Not Seen)    Nitrites: Neg Trichomonas: Not Present    Leukocyte Esterase: Neg leu/uL Mucous: Present      Epithelial Cells: NS (Not Seen)      Yeast: NS (Not Seen)      Sperm: Not Present    ASSESSMENT:      ICD-10 Details  1 GU:   Prostate Cancer - C61   2   Renal calculus - N20.0   3   Gross hematuria - R31.0    PLAN:            Medications New Meds: Tramadol Hcl 50 mg tablet 1 tablet PO Q 6 H PRN   #10  0 Refill(s)  Zofran 4 mg tablet 1 tablet PO Q 8 H PRN For nausea  #10  0 Refill(s)            Orders Labs Urine Culture, PSA  X-Rays: C.[facility]. Stone Protocol Without Contrast  X-Ray Notes: History:  Hematuria: Yes/No  Patient to see MD after  exam: Yes/No  Previous exam: CT / IVP/ US/ KUB/ None  When:  Where:  Diabetic: Yes/ No  BUN/ Creatinine:  Date of last BUN Creatinine:  Weight in pounds:  Allergy- IV Contrast: Yes/ No  Conflicting diabetic meds: Yes/ No  Diabetic Meds:  Prior Authorization #: [id number]          Schedule         Document Letter(s):  Created for Patient: Clinical Summary         Notes:   1. Renal calculus: Urinalysis today does show hematuria, but is otherwise without infectious parameters. Given that urine culture from urgent care was negative, we discussed he may discontinue antibiotic therapy. Considering his history of urolithiasis, I did proceed with CT imaging. This reveals an approximately 7 x 9 mm right UPJ calculus. There is no appreciable hydronephrosis. He has additional nonobstructive calculi bilaterally. I do not appreciate any obvious calculi noted along the course of bilateral ureters based on my interpretation. Final radiology impression is pending, however. I will keep him updated regarding final results. I suspect left flank pain was likely unrelated to urological etiology and it has now resolved. I do suspect findings on today's CT scan are attributing to his hematuria. We reviewed that based on size and location of calculus noted on today's imaging, he will likely need intervention. We discussed both the role of ESWL and ureteroscopy. He has had ESWL in the past and would like to consider moving forward with this. We reviewed indications and potential risk of the procedure in detail. He voiced understanding. Ultimately, I will review today's CT imaging with his urologist for recommendations moving forward and keep him updated. Given that we are going into the weekend, I did provide him with limited amount of narcotic pain medication and nausea medication in the event he were to develop symptoms over the weekend. [place] controlled substance database were reviewed. We reviewed indications and  potential side effects of medications prescribed.   1. Locally advanced prostate cancer: I will move forward with PSA assessment today, as he was to return next week for labs.   2. Mild incontinence: This continues to remain mild and minimally bothersome. We reviewed he continue pelvic floor exercises regularly.   3. Erectile dysfunction: We reviewed options. He is concenred regarding possible side effects with use of Tadalafil. He seems most interested in trial of a vacuum erection device and again  will be set up for an appointment with the vacuum erection device representative for further discussion. He was provided educational literature today.          Next Appointment:      Next Appointment: 07/17/2020 03:00 PM    Appointment Type: Office Visit Established Patient    Location: [facility] [phone]    Provider: Raynelle Bring, M.D.    Reason for Visit: 6 mo ov      * Signed by Azucena Fallen, NP on 07/02/20 at 3:56 PM (EDT)*

## 2020-07-09 ENCOUNTER — Emergency Department (HOSPITAL_BASED_OUTPATIENT_CLINIC_OR_DEPARTMENT_OTHER): Payer: Federal, State, Local not specified - PPO

## 2020-07-09 ENCOUNTER — Other Ambulatory Visit: Payer: Self-pay

## 2020-07-09 ENCOUNTER — Ambulatory Visit (HOSPITAL_BASED_OUTPATIENT_CLINIC_OR_DEPARTMENT_OTHER)
Admission: RE | Admit: 2020-07-09 | Discharge: 2020-07-09 | Disposition: A | Discharge status: Home / Self Care | Payer: Federal, State, Local not specified - PPO | Attending: Urology | Admitting: Urology

## 2020-07-09 ENCOUNTER — Emergency Department (HOSPITAL_BASED_OUTPATIENT_CLINIC_OR_DEPARTMENT_OTHER)
Admission: EM | Admit: 2020-07-09 | Discharge: 2020-07-09 | Disposition: A | Discharge status: Home / Self Care | Payer: Federal, State, Local not specified - PPO | Source: Home / Self Care | Attending: Emergency Medicine | Admitting: Emergency Medicine

## 2020-07-09 ENCOUNTER — Ambulatory Visit (HOSPITAL_COMMUNITY): Payer: Federal, State, Local not specified - PPO

## 2020-07-09 ENCOUNTER — Encounter (HOSPITAL_BASED_OUTPATIENT_CLINIC_OR_DEPARTMENT_OTHER): Payer: Self-pay | Admitting: *Deleted

## 2020-07-09 ENCOUNTER — Encounter (HOSPITAL_BASED_OUTPATIENT_CLINIC_OR_DEPARTMENT_OTHER): Payer: Self-pay | Admitting: Urology

## 2020-07-09 ENCOUNTER — Encounter (HOSPITAL_BASED_OUTPATIENT_CLINIC_OR_DEPARTMENT_OTHER)
Admission: RE | Disposition: A | Discharge status: Home / Self Care | Payer: Self-pay | Source: Home / Self Care | Attending: Urology

## 2020-07-09 DIAGNOSIS — Z8546 Personal history of malignant neoplasm of prostate: Secondary | ICD-10-CM | POA: Insufficient documentation

## 2020-07-09 DIAGNOSIS — N201 Calculus of ureter: Secondary | ICD-10-CM | POA: Diagnosis not present

## 2020-07-09 DIAGNOSIS — Z885 Allergy status to narcotic agent status: Secondary | ICD-10-CM | POA: Insufficient documentation

## 2020-07-09 DIAGNOSIS — R310 Gross hematuria: Secondary | ICD-10-CM | POA: Diagnosis not present

## 2020-07-09 DIAGNOSIS — N200 Calculus of kidney: Secondary | ICD-10-CM

## 2020-07-09 DIAGNOSIS — Z9079 Acquired absence of other genital organ(s): Secondary | ICD-10-CM | POA: Diagnosis not present

## 2020-07-09 DIAGNOSIS — C61 Malignant neoplasm of prostate: Secondary | ICD-10-CM | POA: Diagnosis not present

## 2020-07-09 DIAGNOSIS — Z888 Allergy status to other drugs, medicaments and biological substances status: Secondary | ICD-10-CM | POA: Insufficient documentation

## 2020-07-09 DIAGNOSIS — N202 Calculus of kidney with calculus of ureter: Secondary | ICD-10-CM | POA: Diagnosis not present

## 2020-07-09 DIAGNOSIS — N132 Hydronephrosis with renal and ureteral calculous obstruction: Secondary | ICD-10-CM | POA: Diagnosis not present

## 2020-07-09 DIAGNOSIS — M47816 Spondylosis without myelopathy or radiculopathy, lumbar region: Secondary | ICD-10-CM | POA: Diagnosis not present

## 2020-07-09 DIAGNOSIS — Z841 Family history of disorders of kidney and ureter: Secondary | ICD-10-CM | POA: Insufficient documentation

## 2020-07-09 DIAGNOSIS — N2882 Megaloureter: Secondary | ICD-10-CM | POA: Diagnosis not present

## 2020-07-09 DIAGNOSIS — Z01818 Encounter for other preprocedural examination: Secondary | ICD-10-CM | POA: Diagnosis not present

## 2020-07-09 DIAGNOSIS — R1111 Vomiting without nausea: Secondary | ICD-10-CM | POA: Diagnosis not present

## 2020-07-09 DIAGNOSIS — N529 Male erectile dysfunction, unspecified: Secondary | ICD-10-CM | POA: Diagnosis not present

## 2020-07-09 DIAGNOSIS — N12 Tubulo-interstitial nephritis, not specified as acute or chronic: Secondary | ICD-10-CM | POA: Insufficient documentation

## 2020-07-09 DIAGNOSIS — N2889 Other specified disorders of kidney and ureter: Secondary | ICD-10-CM | POA: Diagnosis not present

## 2020-07-09 HISTORY — PX: EXTRACORPOREAL SHOCK WAVE LITHOTRIPSY: SHX1557

## 2020-07-09 SURGERY — LITHOTRIPSY, ESWL
Anesthesia: LOCAL | Laterality: Right

## 2020-07-09 MED ORDER — SODIUM CHLORIDE 0.9 % IV SOLN: INTRAVENOUS | Status: DC

## 2020-07-09 MED ORDER — ONDANSETRON HCL 4 MG/2ML IJ SOLN
4.0000 mg | Freq: Once | INTRAMUSCULAR | Status: AC
  Administered 2020-07-09: 4 mg via INTRAVENOUS
  Filled 2020-07-09: qty 2

## 2020-07-09 MED ORDER — CIPROFLOXACIN HCL 500 MG PO TABS
ORAL_TABLET | ORAL | Status: AC
  Filled 2020-07-09: qty 1

## 2020-07-09 MED ORDER — HYDROMORPHONE HCL 1 MG/ML IJ SOLN
0.5000 mg | Freq: Once | INTRAMUSCULAR | Status: AC
  Administered 2020-07-09: 0.5 mg via INTRAVENOUS
  Filled 2020-07-09: qty 1

## 2020-07-09 MED ORDER — DIPHENHYDRAMINE HCL 25 MG PO CAPS
25.0000 mg | ORAL_CAPSULE | ORAL | Status: AC
  Administered 2020-07-09: 25 mg via ORAL

## 2020-07-09 MED ORDER — DIAZEPAM 5 MG PO TABS
10.0000 mg | ORAL_TABLET | ORAL | Status: AC
  Administered 2020-07-09: 10 mg via ORAL

## 2020-07-09 MED ORDER — TAMSULOSIN HCL 0.4 MG PO CAPS: 0.4000 mg | ORAL_CAPSULE | Freq: Every day | ORAL | 0 refills | Status: DC

## 2020-07-09 MED ORDER — DIPHENHYDRAMINE HCL 25 MG PO CAPS
ORAL_CAPSULE | ORAL | Status: AC
  Filled 2020-07-09: qty 1

## 2020-07-09 MED ORDER — KETOROLAC TROMETHAMINE 60 MG/2ML IM SOLN
60.0000 mg | Freq: Once | INTRAMUSCULAR | Status: AC
  Administered 2020-07-09: 60 mg via INTRAMUSCULAR
  Filled 2020-07-09: qty 2

## 2020-07-09 MED ORDER — CIPROFLOXACIN HCL 500 MG PO TABS
500.0000 mg | ORAL_TABLET | ORAL | Status: AC
  Administered 2020-07-09: 500 mg via ORAL

## 2020-07-09 MED ORDER — DIAZEPAM 5 MG PO TABS
ORAL_TABLET | ORAL | Status: AC
  Filled 2020-07-09: qty 2

## 2020-07-09 NOTE — Interval H&P Note (Signed)
History and Physical Interval Note:  07/09/2020 12:17 PM  Gaston Islam Stutsman  has presented today for surgery, with the diagnosis of RIGHT RENAL PELVIC CALCULUS.  The various methods of treatment have been discussed with the patient and family. After consideration of risks, benefits and other options for treatment, the patient has consented to  Procedure(s): EXTRACORPOREAL SHOCK WAVE LITHOTRIPSY (ESWL) (Right) as a surgical intervention.  The patient's history has been reviewed, patient examined, no change in status, stable for surgery.  I have reviewed the patient's chart and labs.  Questions were answered to the patient's satisfaction.     [employer]

## 2020-07-09 NOTE — ED Provider Notes (Signed)
[place] EMERGENCY DEPARTMENT Provider Note   CSN: [id number] Arrival date & time: 07/09/20  2012     History Chief Complaint  Patient presents with  . Abdominal Pain  . Emesis    Adrian [name] is a 59 y.o. male.  HPI Patient had lithotripsy by Dr. Alinda Money today.  Had large stones in the right kidney.  States he was doing okay when he went home but then later developed severe pain.  Said nausea vomiting since.  Unable to keep the medicine or the nausea medicine down.  Pain is severe.  Feels like previous kidney stones.  No fevers.  Pain is crampy.  Has had blood in the urine which she states is expected.    Past Medical History:  Diagnosis Date  . Dental crowns present   . History of kidney stones   . Plantar fasciitis of left foot 05/2013    Patient Active Problem List   Diagnosis Date Noted  . Prostate cancer ([place]) 02/21/2019  . Carbuncle, neck 09/20/2018  . Pain in joint of left shoulder 11/20/2017  . Impingement syndrome of left shoulder region 11/20/2017  . Pain in right knee 11/20/2017  . Routine general medical examination at a health care facility 12/16/2013  . Elevated PSA 12/16/2013  . Nephrolithiasis 07/13/2013  . HEMATURIA, HX OF 03/24/2008  . BICIPITAL TENOSYNOVITIS 09/27/2007    Past Surgical History:  Procedure Laterality Date  . BREAST ENHANCEMENT SURGERY Left    asymmetry of chest wall  . CLOSED REDUCTION METACARPAL WITH PERCUTANEOUS PINNING Right 12/22/2004   5th metacarpal  . KNEE ARTHROSCOPY Left   . LITHOTRIPSY    . LYMPHADENECTOMY Bilateral 02/21/2019   Procedure: LYMPHADENECTOMY, PELVIC;  Surgeon: Raynelle Bring, MD;  Location: [facility];  Service: Urology;  Laterality: Bilateral;  . MANDIBLE SURGERY    . PLANTAR FASCIA RELEASE  06/28/2012   Procedure: ENDOSCOPIC PLANTAR FASCIOTOMY;  Surgeon: Ninetta Lights, MD;  Location: [place];  Service: Orthopedics;  Laterality: Right;  . PLANTAR FASCIA RELEASE Left 06/13/2013     Procedure: ENDOSCOPIC PLANTAR FASCIOTOMY;  Surgeon: Ninetta Lights, MD;  Location: [place];  Service: Orthopedics;  Laterality: Left;  . ROBOT ASSISTED LAPAROSCOPIC RADICAL PROSTATECTOMY N/A 02/21/2019   Procedure: XI ROBOTIC ASSISTED LAPAROSCOPIC RADICAL PROSTATECTOMY LEVEL 2;  Surgeon: Raynelle Bring, MD;  Location: [facility];  Service: Urology;  Laterality: N/A;       No family history on file.  Social History   Tobacco Use  . Smoking status: Never Smoker  . Smokeless tobacco: Never Used  Substance Use Topics  . Alcohol use: No  . Drug use: No    Home Medications Prior to Admission medications   Medication Sig Start Date End Date Taking? Authorizing Provider  tamsulosin (FLOMAX) 0.4 MG CAPS capsule Take 1 capsule (0.4 mg total) by mouth at bedtime. 07/09/20   Raynelle Bring, MD    Allergies    Oxycodone and Hydrocodone  Review of Systems   Review of Systems  Physical Exam Updated Vital Signs BP (!) 142/85   Pulse 67   Temp 98.5 F (36.9 C)   Resp 18   Ht 5\' 11"  (1.803 m)   Wt 89.8 kg   SpO2 97%   BMI 27.61 kg/m   Physical Exam  ED Results / Procedures / Treatments   Labs (all labs ordered are listed, but only abnormal results are displayed) Labs Reviewed - No data to display  EKG None  Radiology DG Abd 1 View  Result Date: 07/09/2020 CLINICAL DATA:  Pre-procedure for right-sided lithotripsy EXAM: ABDOMEN - 1 VIEW COMPARISON:  CT dated 07/02/2020 FINDINGS: Again noted is right-sided nephrolithiasis including a stone in the right UPJ measuring approximately 7 mm. The dominant stone in the lower pole the right kidney measures approximately 1.1 cm. There is no definite left-sided nephrolithiasis. No definite stones at the level of the patient's pelvis. Degenerative changes are noted of the lumbar spine. IMPRESSION: Right-sided nephrolithiasis as above. Electronically Signed   By: Constance Holster M.D.   On: 07/09/2020 21:56   CT Renal  Stone Study  Result Date: 07/09/2020 CLINICAL DATA:  Abdominal pain and vomiting status post lithotripsy today. EXAM: CT ABDOMEN AND PELVIS WITHOUT CONTRAST TECHNIQUE: Multidetector CT imaging of the abdomen and pelvis was performed following the standard protocol without IV contrast. COMPARISON:  07/02/2020 FINDINGS: Lower chest: The lung bases are clear. The heart size is normal. Hepatobiliary: The liver is normal. Normal gallbladder.There is no biliary ductal dilation. Pancreas: Normal contours without ductal dilatation. No peripancreatic fluid collection. Spleen: Unremarkable. Adrenals/Urinary Tract: --Adrenal glands: Unremarkable. --Right kidney/ureter: There is worsening right-sided collecting system dilatation. Multiple stones are noted in the lower pole the right kidney measuring up to approximately 8 mm. There are few additional punctate stones in the mid and upper pole. There is a 1 mm stone in the right UPJ. There is an obstructing 6 mm stone in the distal right ureter (axial series 2, image 60). There is an additional obstructing 5 mm stone in the distal right ureter nearly to the bladder (axial series 2, image 77). --Left kidney/ureter: There is nonobstructing left-sided nephrolithiasis. --Urinary bladder: Unremarkable. Stomach/Bowel: --Stomach/Duodenum: No hiatal hernia or other gastric abnormality. Normal duodenal course and caliber. --Small bowel: Unremarkable. --Colon: Unremarkable. --Appendix: Normal. Vascular/Lymphatic: Atherosclerotic calcification is present within the non-aneurysmal abdominal aorta, without hemodynamically significant stenosis. --No retroperitoneal lymphadenopathy. --No mesenteric lymphadenopathy. --No pelvic or inguinal lymphadenopathy. Reproductive: Patient appears to be status post prostatectomy. Other: No ascites or free air. The abdominal wall is normal. Musculoskeletal. No acute displaced fractures. IMPRESSION: 1. Interval development of mild to moderate right-sided  hydronephrosis secondary to two obstructing stones in the distal right ureter as detailed above. 2. Bilateral nephrolithiasis. 3. Status post prostatectomy. Aortic Atherosclerosis (ICD10-I70.0). Electronically Signed   By: Constance Holster M.D.   On: 07/09/2020 21:55    Procedures Procedures (including critical care time)  Medications Ordered in ED Medications  ondansetron [facility]) injection 4 mg (4 mg Intravenous Given 07/09/20 2149)  HYDROmorphone (DILAUDID) injection 0.5 mg (0.5 mg Intravenous Given 07/09/20 2150)  ketorolac (TORADOL) injection 60 mg (60 mg Intramuscular Given 07/09/20 2218)    ED Course  I have reviewed the triage vital signs and the nursing notes.  Pertinent labs & imaging results that were available during my care of the patient were reviewed by me and considered in my medical decision making (see chart for details).    MDM Rules/Calculators/A&P                          Patient with right-sided flank pain after lithotripsy today.  CT scan done which showed some retained stones with hydronephrosis.  Feels better after IV Dilaudid and Zofran.  I think likely tolerated the pain poorly and now it is under control should hopefully be able to keep it under control.  Discussed with Dr. Milford Cage from urology.  Will give IM Toradol which will hopefully  give longer pain control.  Will discharge with outpatient urology follow-up as needed.  Already has pain control.  Reviewed imaging independently. Final Clinical Impression(s) / ED Diagnoses Final diagnoses:  Right ureteral stone    Rx / DC Orders ED Discharge Orders    None       Davonna Belling, MD 07/10/20 0007

## 2020-07-09 NOTE — Op Note (Signed)
See [facility] operative note scanned into chart. Also because of the size, density, location and other factors that cannot be anticipated I feel this will likely be a staged procedure. This fact supersedes any indication in the scanned [facility] operative note to the contrary.  

## 2020-07-09 NOTE — ED Triage Notes (Signed)
Abdominal pain and vomiting.  lithotripsy today to break up stone in his right kidney.

## 2020-07-09 NOTE — Progress Notes (Signed)
Completed at 1410

## 2020-07-09 NOTE — Discharge Instructions (Signed)
1. You should strain your urine and collect all fragments and bring them to your follow up appointment.  2. You should take your pain medication as needed.  Please call if your pain is severe to the point that it is not controlled with your pain medication. 3. You should call if you develop fever > 101 or persistent nausea or vomiting. 4. Your doctor may prescribe tamsulosin to take to help facilitate stone passage.   Lithotripsy, Care After This sheet gives you information about how to care for yourself after your procedure. Your health care provider may also give you more specific instructions. If you have problems or questions, contact your health care provider. What can I expect after the procedure? After the procedure, it is common to have:  Some blood in your urine. This should only last for a few days.  Soreness in your back, sides, or upper abdomen for a few days.  Blotches or bruises on your back where the pressure wave entered the skin.  Pain, discomfort, or nausea when pieces (fragments) of the kidney stone move through the tube that carries urine from the kidney to the bladder (ureter). Stone fragments may pass soon after the procedure, but they may continue to pass for up to 4-8 weeks. ? If you have severe pain or nausea, contact your health care provider. This may be caused by a large stone that was not broken up, and this may mean that you need more treatment.  Some pain or discomfort during urination.  Some pain or discomfort in the lower abdomen or (in men) at the base of the penis. Follow these instructions at home: Medicines  Take over-the-counter and prescription medicines only as told by your health care provider.  If you were prescribed an antibiotic medicine, take it as told by your health care provider. Do not stop taking the antibiotic even if you start to feel better.  Do not drive for 24 hours if you were given a medicine to help you relax (sedative).  Do  not drive or use heavy machinery while taking prescription pain medicine. Eating and drinking      Drink enough water and fluids to keep your urine clear or pale yellow. This helps any remaining pieces of the stone to pass. It can also help prevent new stones from forming.  Eat plenty of fresh fruits and vegetables.  Follow instructions from your health care provider about eating and drinking restrictions. You may be instructed: ? To reduce how much salt (sodium) you eat or drink. Check ingredients and nutrition facts on packaged foods and beverages. ? To reduce how much meat you eat.  Eat the recommended amount of calcium for your age and gender. Ask your health care provider how much calcium you should have. General instructions  Get plenty of rest.  Most people can resume normal activities 1-2 days after the procedure. Ask your health care provider what activities are safe for you.  Your health care provider may direct you to lie in a certain position (postural drainage) and tap firmly (percuss) over your kidney area to help stone fragments pass. Follow instructions as told by your health care provider.  If directed, strain all urine through the strainer that was provided by your health care provider. ? Keep all fragments for your health care provider to see. Any stones that are found may be sent to a medical lab for examination. The stone may be as small as a grain of salt.  Keep  all follow-up visits as told by your health care provider. This is important. Contact a health care provider if:  You have pain that is severe or does not get better with medicine.  You have nausea that is severe or does not go away.  You have blood in your urine longer than your health care provider told you to expect.  You have more blood in your urine.  You have pain during urination that does not go away.  You urinate more frequently than usual and this does not go away.  You develop a rash  or any other possible signs of an allergic reaction. Get help right away if:  You have severe pain in your back, sides, or upper abdomen.  You have severe pain while urinating.  Your urine is very dark red.  You have blood in your stool (feces).  You cannot pass any urine at all.  You feel a strong urge to urinate after emptying your bladder.  You have a fever or chills.  You develop shortness of breath, difficulty breathing, or chest pain.  You have severe nausea that leads to persistent vomiting.  You faint. Summary  After this procedure, it is common to have some pain, discomfort, or nausea when pieces (fragments) of the kidney stone move through the tube that carries urine from the kidney to the bladder (ureter). If this pain or nausea is severe, however, you should contact your health care provider.  Most people can resume normal activities 1-2 days after the procedure. Ask your health care provider what activities are safe for you.  Drink enough water and fluids to keep your urine clear or pale yellow. This helps any remaining pieces of the stone to pass, and it can help prevent new stones from forming.  If directed, strain your urine and keep all fragments for your health care provider to see. Fragments or stones may be as small as a grain of salt.  Get help right away if you have severe pain in your back, sides, or upper abdomen or have severe pain while urinating. This information is not intended to replace advice given to you by your health care provider. Make sure you discuss any questions you have with your health care provider. Document Revised: 12/10/2018 Document Reviewed: 07/20/2016 Elsevier Patient Education  [facility] Instructions  Activity: Get plenty of rest for the remainder of the day. A responsible individual must stay with you for 24 hours following the procedure.  For the next 24 hours, DO NOT: -Drive a  car -Paediatric nurse -Drink alcoholic beverages -Take any medication unless instructed by your physician -Make any legal decisions or sign important papers.  Meals: Start with liquid foods such as gelatin or soup. Progress to regular foods as tolerated. Avoid greasy, spicy, heavy foods. If nausea and/or vomiting occur, drink only clear liquids until the nausea and/or vomiting subsides. Call your physician if vomiting continues.  Special Instructions/Symptoms: Your throat may feel dry or sore from the anesthesia or the breathing tube placed in your throat during surgery. If this causes discomfort, gargle with warm salt water. The discomfort should disappear within 24 hours.

## 2020-07-10 ENCOUNTER — Encounter (HOSPITAL_BASED_OUTPATIENT_CLINIC_OR_DEPARTMENT_OTHER): Payer: Self-pay | Admitting: Urology

## 2020-07-10 DIAGNOSIS — N202 Calculus of kidney with calculus of ureter: Secondary | ICD-10-CM | POA: Diagnosis not present

## 2020-07-13 ENCOUNTER — Encounter (HOSPITAL_COMMUNITY)
Admission: AD | Disposition: A | Discharge status: Home / Self Care | Payer: Self-pay | Source: Ambulatory Visit | Attending: Urology

## 2020-07-13 ENCOUNTER — Other Ambulatory Visit: Payer: Self-pay

## 2020-07-13 ENCOUNTER — Encounter (HOSPITAL_COMMUNITY): Payer: Self-pay | Admitting: Urology

## 2020-07-13 ENCOUNTER — Inpatient Hospital Stay (HOSPITAL_COMMUNITY): Payer: Federal, State, Local not specified - PPO

## 2020-07-13 ENCOUNTER — Inpatient Hospital Stay (HOSPITAL_COMMUNITY): Payer: Federal, State, Local not specified - PPO | Admitting: Anesthesiology

## 2020-07-13 ENCOUNTER — Ambulatory Visit (HOSPITAL_COMMUNITY)
Admission: AD | Admit: 2020-07-13 | Discharge: 2020-07-13 | Disposition: A | Discharge status: Home / Self Care | Payer: Federal, State, Local not specified - PPO | Source: Ambulatory Visit | Attending: Urology | Admitting: Urology

## 2020-07-13 ENCOUNTER — Other Ambulatory Visit: Payer: Self-pay | Admitting: Urology

## 2020-07-13 DIAGNOSIS — Z8546 Personal history of malignant neoplasm of prostate: Secondary | ICD-10-CM | POA: Insufficient documentation

## 2020-07-13 DIAGNOSIS — Z87891 Personal history of nicotine dependence: Secondary | ICD-10-CM | POA: Insufficient documentation

## 2020-07-13 DIAGNOSIS — Z87442 Personal history of urinary calculi: Secondary | ICD-10-CM | POA: Insufficient documentation

## 2020-07-13 DIAGNOSIS — Z9079 Acquired absence of other genital organ(s): Secondary | ICD-10-CM | POA: Insufficient documentation

## 2020-07-13 DIAGNOSIS — N390 Urinary tract infection, site not specified: Secondary | ICD-10-CM | POA: Insufficient documentation

## 2020-07-13 DIAGNOSIS — N132 Hydronephrosis with renal and ureteral calculous obstruction: Secondary | ICD-10-CM | POA: Diagnosis not present

## 2020-07-13 DIAGNOSIS — N202 Calculus of kidney with calculus of ureter: Secondary | ICD-10-CM | POA: Diagnosis not present

## 2020-07-13 DIAGNOSIS — N201 Calculus of ureter: Secondary | ICD-10-CM | POA: Diagnosis not present

## 2020-07-13 DIAGNOSIS — R1084 Generalized abdominal pain: Secondary | ICD-10-CM | POA: Diagnosis not present

## 2020-07-13 HISTORY — PX: CYSTOSCOPY WITH RETROGRADE PYELOGRAM, URETEROSCOPY AND STENT PLACEMENT: SHX5789

## 2020-07-13 LAB — CBC
HCT: 45.9 % (ref 39.0–52.0)
Hemoglobin: 15.8 g/dL (ref 13.0–17.0)
MCH: 29.8 pg (ref 26.0–34.0)
MCHC: 34.4 g/dL (ref 30.0–36.0)
MCV: 86.6 fL (ref 80.0–100.0)
Platelets: 245 10*3/uL (ref 150–400)
RBC: 5.3 MIL/uL (ref 4.22–5.81)
RDW: 12.3 % (ref 11.5–15.5)
WBC: 12.2 10*3/uL — ABNORMAL HIGH (ref 4.0–10.5)
nRBC: 0 % (ref 0.0–0.2)

## 2020-07-13 SURGERY — Surgical Case
Anesthesia: *Unknown

## 2020-07-13 SURGERY — CYSTOURETEROSCOPY, WITH RETROGRADE PYELOGRAM AND STENT INSERTION
Anesthesia: General | Laterality: Right

## 2020-07-13 MED ORDER — MIDAZOLAM HCL 2 MG/2ML IJ SOLN: 0.5000 mg | Freq: Once | INTRAMUSCULAR | Status: DC | PRN

## 2020-07-13 MED ORDER — DEXAMETHASONE SODIUM PHOSPHATE 10 MG/ML IJ SOLN
INTRAMUSCULAR | Status: DC | PRN
  Administered 2020-07-13: 4 mg via INTRAVENOUS

## 2020-07-13 MED ORDER — ONDANSETRON HCL 4 MG/2ML IJ SOLN
INTRAMUSCULAR | Status: DC | PRN
  Administered 2020-07-13: 4 mg via INTRAVENOUS

## 2020-07-13 MED ORDER — FENTANYL CITRATE (PF) 100 MCG/2ML IJ SOLN
INTRAMUSCULAR | Status: DC | PRN
Start: 2020-07-13 — End: 2020-07-13
  Administered 2020-07-13: 50 ug via INTRAVENOUS

## 2020-07-13 MED ORDER — FENTANYL CITRATE (PF) 100 MCG/2ML IJ SOLN
INTRAMUSCULAR | Status: AC
  Filled 2020-07-13: qty 2

## 2020-07-13 MED ORDER — HYDROMORPHONE HCL 1 MG/ML IJ SOLN: 0.2500 mg | INTRAMUSCULAR | Status: DC | PRN

## 2020-07-13 MED ORDER — MIDAZOLAM HCL 5 MG/5ML IJ SOLN
INTRAMUSCULAR | Status: DC | PRN
  Administered 2020-07-13: 2 mg via INTRAVENOUS

## 2020-07-13 MED ORDER — IOHEXOL 300 MG/ML  SOLN
INTRAMUSCULAR | Status: DC | PRN
  Administered 2020-07-13: 9 mL

## 2020-07-13 MED ORDER — PROPOFOL 10 MG/ML IV BOLUS
INTRAVENOUS | Status: AC
  Filled 2020-07-13: qty 20

## 2020-07-13 MED ORDER — PROPOFOL 10 MG/ML IV BOLUS
INTRAVENOUS | Status: DC | PRN
  Administered 2020-07-13: 150 mg via INTRAVENOUS
  Administered 2020-07-13: 50 mg via INTRAVENOUS

## 2020-07-13 MED ORDER — SODIUM CHLORIDE 0.9 % IR SOLN
Status: DC | PRN
  Administered 2020-07-13: 3000 mL via INTRAVESICAL

## 2020-07-13 MED ORDER — LIDOCAINE 2% (20 MG/ML) 5 ML SYRINGE
INTRAMUSCULAR | Status: DC | PRN
  Administered 2020-07-13: 60 mg via INTRAVENOUS

## 2020-07-13 MED ORDER — LACTATED RINGERS IV SOLN: INTRAVENOUS | Status: DC | PRN

## 2020-07-13 MED ORDER — LACTATED RINGERS IV SOLN: Freq: Once | INTRAVENOUS | Status: AC

## 2020-07-13 MED ORDER — CHLORHEXIDINE GLUCONATE 0.12 % MT SOLN
15.0000 mL | OROMUCOSAL | Status: AC
  Administered 2020-07-13: 15 mL via OROMUCOSAL

## 2020-07-13 MED ORDER — PHENYLEPHRINE 40 MCG/ML (10ML) SYRINGE FOR IV PUSH (FOR BLOOD PRESSURE SUPPORT)
PREFILLED_SYRINGE | INTRAVENOUS | Status: DC | PRN
  Administered 2020-07-13: 120 ug via INTRAVENOUS

## 2020-07-13 MED ORDER — CIPROFLOXACIN HCL 500 MG PO TABS: 500.0000 mg | ORAL_TABLET | Freq: Two times a day (BID) | ORAL | 0 refills | Status: AC

## 2020-07-13 MED ORDER — MEPERIDINE HCL 50 MG/ML IJ SOLN: 6.2500 mg | INTRAMUSCULAR | Status: DC | PRN

## 2020-07-13 MED ORDER — MIDAZOLAM HCL 2 MG/2ML IJ SOLN
INTRAMUSCULAR | Status: AC
  Filled 2020-07-13: qty 2

## 2020-07-13 MED ORDER — CEFAZOLIN SODIUM-DEXTROSE 2-4 GM/100ML-% IV SOLN
INTRAVENOUS | Status: AC
  Filled 2020-07-13: qty 100

## 2020-07-13 MED ORDER — CEFAZOLIN SODIUM-DEXTROSE 2-4 GM/100ML-% IV SOLN
2.0000 g | INTRAVENOUS | Status: AC
  Administered 2020-07-13: 2 g via INTRAVENOUS

## 2020-07-13 MED ORDER — PROMETHAZINE HCL 25 MG/ML IJ SOLN: 6.2500 mg | INTRAMUSCULAR | Status: DC | PRN

## 2020-07-13 SURGICAL SUPPLY — 23 items
BAG URO CATCHER STRL LF (MISCELLANEOUS) ×2 IMPLANT
BASKET ZERO TIP NITINOL 2.4FR (BASKET) IMPLANT
CATH URET 5FR 28IN OPEN ENDED (CATHETERS) ×2 IMPLANT
CLOTH BEACON ORANGE TIMEOUT ST (SAFETY) ×2 IMPLANT
COVER SURGICAL LIGHT HANDLE (MISCELLANEOUS) ×2 IMPLANT
COVER WAND RF STERILE (DRAPES) IMPLANT
EXTRACTOR STONE NITINOL NGAGE (UROLOGICAL SUPPLIES) IMPLANT
GLOVE BIOGEL M STRL SZ7.5 (GLOVE) ×2 IMPLANT
GOWN STRL REUS W/TWL XL LVL3 (GOWN DISPOSABLE) ×2 IMPLANT
GUIDEWIRE SENSOR ANG DUAL FLEX (WIRE) IMPLANT
GUIDEWIRE ZIPWRE .038 STRAIGHT (WIRE) ×2 IMPLANT
KIT TURNOVER KIT A (KITS) IMPLANT
LASER FIB FLEXIVA PULSE ID 365 (Laser) IMPLANT
MANIFOLD NEPTUNE II (INSTRUMENTS) ×2 IMPLANT
PACK CYSTO (CUSTOM PROCEDURE TRAY) ×2 IMPLANT
PENCIL SMOKE EVACUATOR (MISCELLANEOUS) IMPLANT
SHEATH URETERAL 12FRX35CM (MISCELLANEOUS) IMPLANT
STENT URET 6FRX24 CONTOUR (STENTS) ×2 IMPLANT
STENT URET 6FRX26 CONTOUR (STENTS) IMPLANT
TRACTIP FLEXIVA PULS ID 200XHI (Laser) IMPLANT
TRACTIP FLEXIVA PULSE ID 200 (Laser)
TUBING CONNECTING 10 (TUBING) ×2 IMPLANT
TUBING UROLOGY SET (TUBING) ×2 IMPLANT

## 2020-07-13 NOTE — Transfer of Care (Signed)
Immediate Anesthesia Transfer of Care Note  Patient: Adrian [name]  Procedure(s) Performed: CYSTOSCOPY WITH RETROGRADE PYELOGRAM, RIGHT URETERAL STENT PLACEMENT (Right )  Patient Location: PACU  Anesthesia Type:General  Level of Consciousness: awake and alert   Airway & Oxygen Therapy: Patient Spontanous Breathing and Patient connected to face mask oxygen  Post-op Assessment: Report given to RN and Post -op Vital signs reviewed and stable  Post vital signs: Reviewed and stable  Last Vitals:  Vitals Value Taken Time  BP    Temp    Pulse    Resp    SpO2      Last Pain:  Vitals:   07/13/20 1403  TempSrc: Oral         Complications: No complications documented.

## 2020-07-13 NOTE — Anesthesia Postprocedure Evaluation (Signed)
Anesthesia Post Note  Patient: Adrian [name]  Procedure(s) Performed: CYSTOSCOPY WITH RETROGRADE PYELOGRAM, RIGHT URETERAL STENT PLACEMENT (Right )     Patient location during evaluation: PACU Anesthesia Type: General Level of consciousness: awake and alert, patient cooperative and oriented Pain management: pain level controlled Vital Signs Assessment: post-procedure vital signs reviewed and stable Respiratory status: spontaneous breathing, nonlabored ventilation and respiratory function stable Cardiovascular status: blood pressure returned to baseline and stable Postop Assessment: no apparent nausea or vomiting Anesthetic complications: no   No complications documented.  Last Vitals:  Vitals:   07/13/20 1403 07/13/20 1704  BP: (!) 153/97 134/87  Pulse: 94 80  Resp: 18 12  Temp: 36.7 C 37.3 C  SpO2: 100% 100%    Last Pain:  Vitals:   07/13/20 1704  TempSrc:   PainSc: 0-No pain                 Phoenix Riesen,E. Crista Nuon

## 2020-07-13 NOTE — Anesthesia Procedure Notes (Signed)
Date/Time: 07/13/2020 4:56 PM Performed by: Cynda Familia, CRNA Oxygen Delivery Method: Simple face mask Placement Confirmation: positive ETCO2 and breath sounds checked- equal and bilateral Dental Injury: Teeth and Oropharynx as per pre-operative assessment

## 2020-07-13 NOTE — Op Note (Signed)
Operative Note  Preoperative diagnosis:  1.  Right ureteral Steinstrasse  Postoperative diagnosis: 1.  Right ureteral Steinstrasse 2.  Urinary tract infection  Procedure(s): 1.  Cystoscopy with right JJ stent placement 2.  Right retrograde pyelogram with intraoperative interpretation of fluoroscopic imaging  Surgeon: Ellison Hughs, MD  Assistants:  None  Anesthesia:  General  Complications:  None  EBL: Less than 5 mL  Specimens: 1.  Urine specimen for culture and sensitivity  Drains/Catheters: 1.  Right 6 French, 24 cm JJ stent without tether 2.  16 French Foley catheter  Intraoperative findings:   1. Right retrograde pyelogram revealed a filling defect within the distal aspects of the right ureter, consistent with ureteral Steinstrasse.  He was also noted to have uniform dilation of the right ureter that extended up to the right renal pelvis.  No other filling defects were seen within the right ureter or within the renal pelvis and its associated calyces. 2. Purulent urine was seen draining from the right ureteral orifice following Glidewire placement  Indication:  Adrian [name] is a 59 y.o. male with a history of kidney stones.  He underwent right ESWL for a 1 cm UPJ calculus on 07/09/2020.  Immediately following the procedure, he had persistent right-sided flank pain associated with nausea and chills.  Follow-up imaging revealed evidence of right ureteral Steinstrasse and persistent hydronephrosis.  He has been consented for the above procedures, voices understanding and wishes to proceed.  Description of procedure:  After informed consent was obtained, the patient was brought to the operating room and general LMA anesthesia was administered. The patient was then placed in the dorsolithotomy position and prepped and draped in the usual sterile fashion. A timeout was performed. A 23 French rigid cystoscope was then inserted into the urethral meatus and advanced into the  bladder under direct vision. A complete bladder survey revealed no intravesical pathology.  A 5 French ureteral catheter was then inserted into the right ureteral orifice and a retrograde pyelogram was obtained, with the findings listed above.  A Glidewire was then used to intubate the lumen of the ureteral catheter and was advanced up to the right renal pelvis, under fluoroscopic guidance.  The catheter was then removed, leaving the wire in place.  Following wire advancement up the right ureter, purulent, brown urine was seen draining from the right ureteral orifice.  At that point, the decision was made to abandon any notion of ureteroscopy and proceed with right ureteral stent placement.  A urine specimen was collected for culture and sensitivity.    A 6 French, 24 cm JJ stent was then advanced over the wire and into good position within the right collecting system, confirming placement via fluoroscopy.  There continue to be a significant amount of purulent urine draining from the right UO and through the stent following placement.  A 16 French Foley catheter was then inserted to assist with maximal drainage of his urinary tract.  His catheter was then placed to gravity drainage.  He tolerated the procedure well and was transferred to the postanesthesia unit in stable condition.  Plan: Follow-up with Dr. Alinda Money in the coming weeks to plan ureteroscopy

## 2020-07-13 NOTE — Anesthesia Preprocedure Evaluation (Addendum)
Anesthesia Evaluation  Patient identified by MRN, date of birth, ID band Patient awake    Reviewed: Allergy & Precautions, NPO status , Patient's Chart, lab work & pertinent test results  History of Anesthesia Complications Negative for: history of anesthetic complications  Airway Mallampati: II  TM Distance: >3 FB Neck ROM: Full    Dental  (+) Dental Advisory Given, Missing   Pulmonary neg pulmonary ROS,  07/08/2020 SARS coronavirus NEG   breath sounds clear to auscultation       Cardiovascular (-) angina Rhythm:Regular Rate:Normal     Neuro/Psych negative neurological ROS     GI/Hepatic negative GI ROS, Neg liver ROS,   Endo/Other  negative endocrine ROS  Renal/GU stones     Musculoskeletal   Abdominal   Peds  Hematology negative hematology ROS (+)   Anesthesia Other Findings   Reproductive/Obstetrics                            Anesthesia Physical Anesthesia Plan  ASA: II  Anesthesia Plan: General   Post-op Pain Management:    Induction: Intravenous  PONV Risk Score and Plan: 2 and Ondansetron and Dexamethasone  Airway Management Planned: LMA  Additional Equipment: None  Intra-op Plan:   Post-operative Plan:   Informed Consent: I have reviewed the patients History and Physical, chart, labs and discussed the procedure including the risks, benefits and alternatives for the proposed anesthesia with the patient or authorized representative who has indicated his/her understanding and acceptance.     Dental advisory given  Plan Discussed with: CRNA and Surgeon  Anesthesia Plan Comments:        Anesthesia Quick Evaluation

## 2020-07-13 NOTE — H&P (Signed)
PRE-OP H&P  Office Visit Report     07/13/2020   --------------------------------------------------------------------------------   Adrian [name]  MRN: [id number]  DOB: 1961-05-27, 59 year old Male    PRIMARY CARE:  Dellis Filbert A. Sherren Mocha (retired), MD  REFERRING:  Dellis Filbert A. Sherren Mocha (retired), MD  PROVIDER:  Festus Aloe, M.D.  TREATING:  Ellison Hughs, M.D.  LOCATION:  [facility] [phone]     --------------------------------------------------------------------------------   CC: I have pain in the flank.  HPI: Adrian [name] is a 59 year-old male established patient who is here for flank pain.  The problem is on the right side. The pain is sharp. The pain is intermittent. The pain does radiate.   None< makes the pain better. Nothing causes the pain to become worse.   He has had this same pain previously. He has had kidney stones.   -s/p ESWL for a 7 mm right UPJ stone on 07/09/20   07/10/20: Patient presents today with right flank pain. He underwent shockwave lithotripsy he yesterday. He has not passed any stone fragments. He is having significant bladder pain.   The patient had a right-sided 1 cm UPJ stone. Tolerated the procedure well the all the Ca with no significant whole pain immediately postoperatively. Once the patient had dinner started to drink he felt the pain start to increase. He subsequently went to [facility] and was evaluated. His CT scan was performed and demonstrated several fragments in the distal right ureter. The patient had lots of fragments remaining in the renal pelvis. He was given some Toradol and his symptoms improved quite significantly, enough for him to go home. He felt fine through the night but it has again developed pain this morning. The pain is 10/10. Patient denies any significant hematuria. He has not had any fevers or chills. He has had some nausea, but currently his not complaining of any.   07/13/20: The patient is back  today with persistent right sided flank pain associated with chills and nausea. Denies fevers at home.     ALLERGIES: Oxycodone sildenafil - Dizziness (Severe)    MEDICATIONS: Ketorolac Tromethamine 10 mg tablet 1 tablet PO Q 6 H PRN  Oxycodone Hcl 5 mg tablet 1-2 tablet PO Q 6 H PRN  Tadalafil 20 mg tablet 1 tablet PO prn as directed  Tramadol Hcl 50 mg tablet 1 tablet PO Q 6 H PRN  Zofran 4 mg tablet 1 tablet PO Q 8 H PRN For nausea     GU PSH: ESWL, Right - 07/09/2020, 2009, 2009 Laparoscopy; Lymphadenectomy - 02/21/2019 Prostate Needle Biopsy - 2020 Robotic Radical Prostatectomy - 02/21/2019       PSH Notes: Foot Surgery, Lithotripsy, Knee Arthroscopy, Lithotripsy   NON-GU PSH: Knee Arthroscopy, Left Partial Remove Foot Fascia, Bilateral Surgical Pathology, Gross And Microscopic Examination For Prostate Needle - 2020     GU PMH: Renal and ureteral calculus - 07/10/2020 Gross hematuria - 07/02/2020 Prostate Cancer - 07/02/2020, - 2020 Renal calculus - 07/02/2020, Kidney stone on right side, - 2014 ED due to arterial insufficiency - 02/27/2019 Stress Incontinence - 01/17/2019, - 2020 Elevated PSA - 2020, - 08/28/2018 History of urolithiasis, Nephrolithiasis - 2014 Nocturia, Nocturia - 2014 Other microscopic hematuria, Microscopic hematuria - 2014      PMH Notes:   1) Prostate cancer: He is s/p a BNS RAL radical prostatectomy and BPLND on 02/21/19.   Diagnosis: pT3a N0 Mx, Gleason 3+4=7 adenocarcinoma with negative surgical margins  Pretreatment PSA:  7.9  Pretreatment SHIM score: 25   2) Urolithiasis:   Oct 2021: ESWL of 1 cm right renal pelvic stone   NON-GU PMH: None   FAMILY HISTORY: Death of family member - Father Kidney Stones - Runs in Family Lung Cancer - Father    Notes: 1 son; 1 daughter   SOCIAL HISTORY: Marital Status: Married Preferred Language: English; Ethnicity: Not Hispanic Or Latino; Race: White Current Smoking Status: Patient has never  smoked.   Tobacco Use Assessment Completed: Used Tobacco in last 30 days? Does not use smokeless tobacco. Has never drank.  Drinks 3 caffeinated drinks per day. Patient's occupation is/was [place] [employer].     Notes: Former smoker, Tobacco Use, Marital History - Currently Married, Occupation:, Alcohol Use, Caffeine Use   REVIEW OF SYSTEMS:    GU Review Male:   Patient denies frequent urination, hard to postpone urination, burning/ pain with urination, get up at night to urinate, leakage of urine, stream starts and stops, trouble starting your stream, have to strain to urinate , erection problems, and penile pain.  Gastrointestinal (Upper):   Patient reports nausea. Patient denies vomiting and indigestion/ heartburn.  Gastrointestinal (Lower):   Patient denies diarrhea and constipation.  Constitutional:   Patient denies fever, night sweats, weight loss, and fatigue.  Skin:   Patient denies skin rash/ lesion and itching.  Eyes:   Patient denies blurred vision and double vision.  Ears/ Nose/ Throat:   Patient denies sore throat and sinus problems.  Hematologic/Lymphatic:   Patient denies swollen glands and easy bruising.  Cardiovascular:   Patient denies leg swelling and chest pains.  Respiratory:   Patient denies cough and shortness of breath.  Endocrine:   Patient denies excessive thirst.  Musculoskeletal:   Patient denies back pain and joint pain.  Neurological:   Patient reports dizziness. Patient denies headaches.  Psychologic:   Patient denies anxiety and depression.   Notes: Pt c/o gross hematuria    VITAL SIGNS:      07/13/2020 10:28 AM  Weight 195 lb / 88.45 kg  Height 71 in / 180.34 cm  BP 142/87 mmHg  Heart Rate 79 /min  Temperature 98.2 F / 36.7 C  BMI 27.2 kg/m   MULTI-SYSTEM PHYSICAL EXAMINATION:    Constitutional: Well-nourished. No physical deformities. Normally developed. Good grooming.  Neurologic / Psychiatric: Oriented to time, oriented to place, oriented  to person. No depression, no anxiety, no agitation.  Musculoskeletal: Normal gait and station of head and neck.     Complexity of Data:   07/02/20 12/03/19 05/22/19 11/05/18 08/28/18  PSA  Total PSA <0.015 ng/mL <0.015 ng/mL <0.015 ng/mL 8.09 ng/mL 6.94 ng/mL  Free PSA    0.99 ng/mL 0.99 ng/mL  % Free PSA    12 % PSA 14 % PSA    PROCEDURES:         Renal Ultrasound (Limited) - 11914  Kidney: Right Length: 12.6 cm Depth: 6.9 cm Cortical Width: 1.5 cm Width: 5.2 cm  Right Kidney/Ureter:  Mild hydro. Multiple calcifications.  Bladder:  Emptied. Not visualized.      Patient confirmed No Neulasta OnPro Device.            KUB - [id number]  A single view of the abdomen is obtained.      Patient confirmed No Neulasta OnPro Device.           Urinalysis w/Scope Dipstick Dipstick Cont'd Micro  Color: Amber Bilirubin: Neg mg/dL WBC/hpf: 0 - 5/hpf  Appearance: Clear Ketones: 1+ mg/dL RBC/hpf: 0 - 2/hpf  Specific Gravity: >1.030 Blood: 1+ ery/uL Bacteria: Few (10-25/hpf)  pH: 5.5 Protein: 1+ mg/dL Cystals: Ca Oxalate  Glucose: Neg mg/dL Urobilinogen: 0.2 mg/dL Casts: NS (Not Seen)    Nitrites: Neg Trichomonas: Not Present    Leukocyte Esterase: Neg leu/uL Mucous: Not Present      Epithelial Cells: NS (Not Seen)      Yeast: NS (Not Seen)      Sperm: Not Present    ASSESSMENT:      ICD-10 Details  1 GU:   Flank Pain - R10.84 Right, Acute, Uncomplicated  2   Renal and ureteral calculus - N20.2 Right, Acute, Uncomplicated   PLAN:           Orders Labs Urine Culture  X-Rays: KUB    Renal Ultrasound (Limited) - Right RUS          Schedule Return Visit/Planned Activity: Next Available Appointment - Schedule Surgery          Document Letter(s):  Created for Patient: Clinical Summary         Notes:   -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  -Right RUS shows mild hydronephrosis along with multiple  areas of stone shadowing.  -The risks, benefits and alternatives of cystoscopy with possible RIGHT ureteroscopy, laser lithotripsy and ureteral stent placement was discussed the patient. Risks included, but are not limited to: bleeding, urinary tract infection, ureteral injury/avulsion, ureteral stricture formation, retained stone fragments, the possibility that multiple surgeries may be required to treat the stone(s), MI, stroke, PE and the inherent risks of general anesthesia. The patient voices understanding and wishes to proceed.

## 2020-07-13 NOTE — Anesthesia Procedure Notes (Signed)
Procedure Name: LMA Insertion Date/Time: 07/13/2020 4:33 PM Performed by: Cynda Familia, CRNA Pre-anesthesia Checklist: Patient identified and Suction available Patient Re-evaluated:Patient Re-evaluated prior to induction Oxygen Delivery Method: Circle System Utilized Preoxygenation: Pre-oxygenation with 100% oxygen Induction Type: IV induction Ventilation: Mask ventilation without difficulty LMA: LMA inserted and LMA with gastric port inserted LMA Size: 4.0 Number of attempts: 1 Airway Equipment and Method: Bite block Placement Confirmation: positive ETCO2 Tube secured with: Tape Dental Injury: Teeth and Oropharynx as per pre-operative assessment  Comments: Smooth IV induction SRNA- LMA inserted by SRNA-- atraumatic- teeth and mouth as preop- bilat BS jackson-- all assisted and supervised by [employer]

## 2020-07-14 ENCOUNTER — Encounter (HOSPITAL_COMMUNITY): Payer: Self-pay | Admitting: Urology

## 2020-07-14 LAB — URINE CULTURE: Culture: NO GROWTH

## 2020-07-15 ENCOUNTER — Other Ambulatory Visit: Payer: Self-pay | Admitting: Urology

## 2020-07-31 NOTE — Patient Instructions (Addendum)
DUE TO COVID-19 ONLY ONE VISITOR IS ALLOWED TO COME WITH YOU AND STAY IN THE WAITING ROOM ONLY DURING PRE OP AND PROCEDURE DAY OF SURGERY. THE 1 VISITOR  MAY VISIT WITH YOU AFTER SURGERY IN YOUR PRIVATE ROOM DURING VISITING HOURS ONLY!  YOU NEED TO HAVE A COVID 19 TEST ON: 08/10/20 @ 8:20 AM, THIS TEST MUST BE DONE BEFORE SURGERY,  COVID TESTING SITE [address] [place] [address], IT IS ON THE RIGHT GOING OUT [address] APPROXIMATELY  2 MINUTES PAST [employer] ON THE RIGHT. ONCE YOUR COVID TEST IS COMPLETED,  PLEASE BEGIN THE QUARANTINE INSTRUCTIONS AS OUTLINED IN YOUR HANDOUT.                [place]   Your procedure is scheduled on: 08/13/20   Report to [facility] Main  Entrance   Report to admitting at: 12:00 PM     Call this number if you have problems the morning of surgery [phone]    Remember: Do not eat solid food :After Midnight. Clear liquids until: 11:00 am.  CLEAR LIQUID DIET   Foods Allowed                                                                     Foods Excluded  Coffee and tea, regular and decaf                             liquids that you cannot  Plain Jell-O any favor except red or purple                                           see through such as: Fruit ices (not with fruit pulp)                                     milk, soups, orange juice  Iced Popsicles                                    All solid food Carbonated beverages, regular and diet                                    Cranberry, grape and apple juices Sports drinks like Gatorade Lightly seasoned clear broth or consume(fat free) Sugar, honey syrup  Sample Menu Breakfast                                Lunch                                     Supper Cranberry juice  Beef broth                            Chicken broth Jell-O                                     Grape juice                           Apple juice Coffee or tea                         Jell-O                                      Popsicle                                                Coffee or tea                        Coffee or tea  _____________________________________________________________________  BRUSH YOUR TEETH MORNING OF SURGERY AND RINSE YOUR MOUTH OUT, NO CHEWING GUM CANDY OR MINTS.              You may not have any metal on your body including hair pins and              piercings  Do not wear jewelry, lotions, powders or perfumes, deodorant             Men may shave face and neck.   Do not bring valuables to the hospital. [place].  Contacts, dentures or bridgework may not be worn into surgery.  Leave suitcase in the car. After surgery it may be brought to your room.     Patients discharged the day of surgery will not be allowed to drive home. IF YOU ARE HAVING SURGERY AND GOING HOME THE SAME DAY, YOU MUST HAVE AN ADULT TO DRIVE YOU HOME AND BE WITH YOU FOR 24 HOURS. YOU MAY GO HOME BY TAXI OR UBER OR ORTHERWISE, BUT AN ADULT MUST ACCOMPANY YOU HOME AND STAY WITH YOU FOR 24 HOURS.  Name and phone number of your driver:  Special Instructions: N/A              Please read over the following fact sheets you were given: _____________________________________________________________________  [facility] - Preparing for Surgery Before surgery, you can play an important role.  Because skin is not sterile, your skin needs to be as free of germs as possible.  You can reduce the number of germs on your skin by washing with CHG (chlorahexidine gluconate) soap before surgery.  CHG is an antiseptic cleaner which kills germs and bonds with the skin to continue killing germs even after washing. Please DO NOT use if you have an allergy to CHG or antibacterial soaps.  If your skin becomes reddened/irritated stop using the CHG and inform your nurse when you arrive at Short Stay. Do not shave (including legs  and  underarms) for at least 48 hours prior to the first CHG shower.  You may shave your face/neck. Please follow these instructions carefully:  1.  Shower with CHG Soap the night before surgery and the  morning of Surgery.  2.  If you choose to wash your hair, wash your hair first as usual with your  normal  shampoo.  3.  After you shampoo, rinse your hair and body thoroughly to remove the  shampoo.                           4.  Use CHG as you would any other liquid soap.  You can apply chg directly  to the skin and wash                       Gently with a scrungie or clean washcloth.  5.  Apply the CHG Soap to your body ONLY FROM THE NECK DOWN.   Do not use on face/ open                           Wound or open sores. Avoid contact with eyes, ears mouth and genitals (private parts).                       Wash face,  Genitals (private parts) with your normal soap.             6.  Wash thoroughly, paying special attention to the area where your surgery  will be performed.  7.  Thoroughly rinse your body with warm water from the neck down.  8.  DO NOT shower/wash with your normal soap after using and rinsing off  the CHG Soap.                9.  Pat yourself dry with a clean towel.            10.  Wear clean pajamas.            11.  Place clean sheets on your bed the night of your first shower and do not  sleep with pets. Day of Surgery : Do not apply any lotions/deodorants the morning of surgery.  Please wear clean clothes to the hospital/surgery center.  FAILURE TO FOLLOW THESE INSTRUCTIONS MAY RESULT IN THE CANCELLATION OF YOUR SURGERY PATIENT SIGNATURE_________________________________  NURSE SIGNATURE__________________________________  ________________________________________________________________________

## 2020-08-03 ENCOUNTER — Encounter (HOSPITAL_COMMUNITY)
Admission: RE | Admit: 2020-08-03 | Discharge: 2020-08-03 | Disposition: A | Discharge status: Home / Self Care | Payer: Federal, State, Local not specified - PPO | Source: Ambulatory Visit | Attending: Urology | Admitting: Urology

## 2020-08-03 ENCOUNTER — Encounter (HOSPITAL_COMMUNITY): Payer: Self-pay

## 2020-08-03 ENCOUNTER — Other Ambulatory Visit: Payer: Self-pay

## 2020-08-03 DIAGNOSIS — Z01812 Encounter for preprocedural laboratory examination: Secondary | ICD-10-CM | POA: Insufficient documentation

## 2020-08-03 HISTORY — DX: Malignant (primary) neoplasm, unspecified: C80.1

## 2020-08-03 LAB — CBC
HCT: 44.2 % (ref 39.0–52.0)
Hemoglobin: 15.2 g/dL (ref 13.0–17.0)
MCH: 29.6 pg (ref 26.0–34.0)
MCHC: 34.4 g/dL (ref 30.0–36.0)
MCV: 86 fL (ref 80.0–100.0)
Platelets: 276 10*3/uL (ref 150–400)
RBC: 5.14 MIL/uL (ref 4.22–5.81)
RDW: 12.4 % (ref 11.5–15.5)
WBC: 6.5 10*3/uL (ref 4.0–10.5)
nRBC: 0 % (ref 0.0–0.2)

## 2020-08-03 LAB — BASIC METABOLIC PANEL
Anion gap: 10 (ref 5–15)
BUN: 17 mg/dL (ref 6–20)
CO2: 22 mmol/L (ref 22–32)
Calcium: 9 mg/dL (ref 8.9–10.3)
Chloride: 108 mmol/L (ref 98–111)
Creatinine, Ser: 1.11 mg/dL (ref 0.61–1.24)
GFR, Estimated: >60 mL/min (ref >60)
Glucose, Bld: 107 mg/dL — ABNORMAL HIGH (ref 70–99)
Potassium: 4 mmol/L (ref 3.5–5.1)
Sodium: 140 mmol/L (ref 135–145)

## 2020-08-03 NOTE — Progress Notes (Signed)
COVID Vaccine Completed: Yes Date COVID Vaccine completed: 01/14/20 COVID vaccine manufacturer: Pfizer      PCP - Dr. Rande Lawman Cardiologist -   Chest x-ray -  EKG -  Stress Test -  ECHO -  Cardiac Cath -  Pacemaker/ICD device last checked:  Sleep Study -  CPAP -   Fasting Blood Sugar -  Checks Blood Sugar _____ times a day  Blood Thinner Instructions: Aspirin Instructions: Last Dose:  Anesthesia review:   Patient denies shortness of breath, fever, cough and chest pain at PAT appointment   Patient verbalized understanding of instructions that were given to them at the PAT appointment. Patient was also instructed that they will need to review over the PAT instructions again at home before surgery.

## 2020-08-10 ENCOUNTER — Other Ambulatory Visit (HOSPITAL_COMMUNITY)
Admission: RE | Admit: 2020-08-10 | Discharge: 2020-08-10 | Disposition: A | Discharge status: Home / Self Care | Payer: Federal, State, Local not specified - PPO | Source: Ambulatory Visit | Attending: Urology | Admitting: Urology

## 2020-08-10 DIAGNOSIS — Z9079 Acquired absence of other genital organ(s): Secondary | ICD-10-CM | POA: Diagnosis not present

## 2020-08-10 DIAGNOSIS — Z20822 Contact with and (suspected) exposure to covid-19: Secondary | ICD-10-CM | POA: Insufficient documentation

## 2020-08-10 DIAGNOSIS — Z8546 Personal history of malignant neoplasm of prostate: Secondary | ICD-10-CM | POA: Diagnosis not present

## 2020-08-10 DIAGNOSIS — N201 Calculus of ureter: Secondary | ICD-10-CM | POA: Diagnosis not present

## 2020-08-10 DIAGNOSIS — Z01818 Encounter for other preprocedural examination: Secondary | ICD-10-CM | POA: Insufficient documentation

## 2020-08-10 DIAGNOSIS — Z885 Allergy status to narcotic agent status: Secondary | ICD-10-CM | POA: Diagnosis not present

## 2020-08-10 DIAGNOSIS — Z888 Allergy status to other drugs, medicaments and biological substances status: Secondary | ICD-10-CM | POA: Diagnosis not present

## 2020-08-10 LAB — SARS CORONAVIRUS 2 (TAT 6-24 HRS): SARS Coronavirus 2: NEGATIVE

## 2020-08-12 NOTE — H&P (Signed)
Office Visit Report     07/14/2020   --------------------------------------------------------------------------------   Gaston Islam. Villard  MRN: [id number]  DOB: [birth year], 59 year old Male  SSN: [id number]   PRIMARY CARE:  Dellis Filbert A. Sherren Mocha (retired), MD  REFERRING:  Dellis Filbert A. Sherren Mocha (retired), MD  PROVIDER:  Festus Aloe, M.D.  TREATING:  Raynelle Bring, M.D.  LOCATION:  [facility] [phone]     --------------------------------------------------------------------------------   CC/HPI: 1. Prostate cancer  2. Urolithiasis   He returns today for further evaluation. He had been scheduled to see me for routine visit next week follow-up on his prostate cancer. However, his PSA was recently checked when he presented with a kidney stone in this was noted to be undetectable. He is now almost 1 and half years out from his radical prostatectomy for locally advanced disease.   He did recently present with multiple right-sided renal calculi. He underwent shockwave therapy of a 1 cm right UPJ calculus. He developed subsequent pain and was seen last Friday and again all on Monday. He was taken to the operating room on Monday and was planned undergoing possible ureteroscopy but upon placement of the stent was noted to have purulence drainage. He was therefore started on ciprofloxacin and urine cultures were obtained. He was discharged home with a Foley catheter and his stent. He has remained afebrile in follows up today for a voiding trial.     ALLERGIES: Oxycodone sildenafil - Dizziness (Severe)    MEDICATIONS: Ketorolac Tromethamine 10 mg tablet 1 tablet PO Q 6 H PRN  Oxycodone Hcl 5 mg tablet 1-2 tablet PO Q 6 H PRN  Tadalafil 20 mg tablet 1 tablet PO prn as directed  Tramadol Hcl 50 mg tablet 1 tablet PO Q 6 H PRN  Zofran 4 mg tablet 1 tablet PO Q 8 H PRN For nausea     GU PSH: ESWL, Right - 07/09/2020, 2009, 2009 Laparoscopy; Lymphadenectomy - 02/21/2019 Prostate Needle  Biopsy - 2020 Robotic Radical Prostatectomy - 02/21/2019       PSH Notes: Foot Surgery, Lithotripsy, Knee Arthroscopy, Lithotripsy   NON-GU PSH: Knee Arthroscopy, Left Partial Remove Foot Fascia, Bilateral Surgical Pathology, Gross And Microscopic Examination For Prostate Needle - 2020     GU PMH: Flank Pain - 07/13/2020 Renal and ureteral calculus - 07/13/2020, - 07/10/2020 Gross hematuria - 07/02/2020 Prostate Cancer - 07/02/2020, - 2020 Renal calculus - 07/02/2020, Kidney stone on right side, - 2014 ED due to arterial insufficiency - 02/27/2019 Stress Incontinence - 01/17/2019, - 2020 Elevated PSA - 2020, - 08/28/2018 History of urolithiasis, Nephrolithiasis - 2014 Nocturia, Nocturia - 2014 Other microscopic hematuria, Microscopic hematuria - 2014      PMH Notes:   1) Prostate cancer: He is s/p a BNS RAL radical prostatectomy and BPLND on 02/21/19.   Diagnosis: pT3a N0 Mx, Gleason 3+4=7 adenocarcinoma with negative surgical margins  Pretreatment PSA: 7.9  Pretreatment SHIM score: 25   2) Urolithiasis:   Oct 2021: ESWL of 1 cm right renal pelvic stone   NON-GU PMH: No Non-GU PMH    FAMILY HISTORY: Death of family member - Father Kidney Stones - Runs in Family Lung Cancer - Father    Notes: 1 son; 1 daughter   SOCIAL HISTORY: Marital Status: Married Preferred Language: English; Ethnicity: Not Hispanic Or Latino; Race: White Current Smoking Status: Patient has never smoked.   Tobacco Use Assessment Completed: Used Tobacco in last 30 days? Does not use smokeless tobacco. Has never  drank.  Drinks 3 caffeinated drinks per day. Patient's occupation is/was [place] [employer].     Notes: Former smoker, Tobacco Use, Marital History - Currently Married, Occupation:, Alcohol Use, Caffeine Use   REVIEW OF SYSTEMS:    GU Review Male:   Patient denies frequent urination, hard to postpone urination, burning/ pain with urination, get up at night to urinate, leakage of urine,  stream starts and stops, trouble starting your streams, and have to strain to urinate .  Gastrointestinal (Lower):   Patient denies diarrhea and constipation.  Gastrointestinal (Upper):   Patient denies nausea and vomiting.  Constitutional:   Patient denies fever, night sweats, weight loss, and fatigue.  Skin:   Patient denies skin rash/ lesion and itching.  Eyes:   Patient denies blurred vision and double vision.  Ears/ Nose/ Throat:   Patient denies sore throat and sinus problems.  Hematologic/Lymphatic:   Patient denies swollen glands and easy bruising.  Cardiovascular:   Patient denies chest pains and leg swelling.  Respiratory:   Patient denies cough and shortness of breath.  Endocrine:   Patient denies excessive thirst.  Musculoskeletal:   Patient denies back pain and joint pain.  Neurological:   Patient denies headaches and dizziness.  Psychologic:   Patient denies depression and anxiety.   VITAL SIGNS:      07/14/2020 02:23 PM  Weight 195 lb / 88.45 kg  Height 71 in / 180.34 cm  BP 131/84 mmHg  Pulse 82 /min  Temperature 96.9 F / 36.0 C  BMI 27.2 kg/m   MULTI-SYSTEM PHYSICAL EXAMINATION:    Constitutional: Well-nourished. No physical deformities. Normally developed. Good grooming.  Respiratory: No labored breathing, no use of accessory muscles.   Cardiovascular: Normal temperature, normal extremity pulses, no swelling, no varicosities.  Gastrointestinal: No mass, no tenderness, no rigidity, non obese abdomen. No CVA tenderness.     Complexity of Data:  Lab Test Review:   PSA  Records Review:   Previous Patient Records   07/02/20 12/03/19 05/22/19 11/05/18 08/28/18  PSA  Total PSA <0.015 ng/mL <0.015 ng/mL <0.015 ng/mL 8.09 ng/mL 6.94 ng/mL  Free PSA    0.99 ng/mL 0.99 ng/mL  % Free PSA    12 % PSA 14 % PSA    PROCEDURES:         Voiding Trial - 51700  Instilled Volume: 120 cc  Voided Volume: 121 cc   ASSESSMENT:      ICD-10 Details  1 GU:   Renal and ureteral  calculus - N20.2   2   Prostate Cancer - C61    PLAN:           Schedule Return Visit/Planned Activity: Other See Visit Notes             Note: Will call to schedule surgery  Return Visit/Planned Activity: Other See Visit Notes             Note: Cancel appts on 11/5 and 11/11          Document Letter(s):  Created for Patient: Clinical Summary         Notes:   1. Prostate cancer: His PSA remains undetectable. He will continue PSA surveillance every 6 months and would be due for next PSA around April or May of 2022.   2. Right renal and ureteral calculi: He will continue ciprofloxacin empirically. His urine culture is pending his antibiotics will be adjusted as needed. He will complete a full course of antibiotics and  we have discussed proceeding with definitive treatment of his right ureteral calculi with ureteroscopic laser lithotripsy. He is in agreement and after further discussion, would also like to treat his right renal calculi which would appear to be appropriate. We will therefore proceed with cystoscopy along with right ureteroscopy with laser lithotripsy of both his ureteral and renal calculi. This will be scheduled for after he completes treatment of his acute infection. The potential risks, complications, and expected recovery process were discussed.   We will then proceed with further evaluation of his stone risk with a full metabolic evaluation quitting 24 hour urine analysis.   Cc:         Next Appointment:      Next Appointment: 07/14/2020 02:00 PM    Appointment Type: Office Visit Established Patient    Location: [facility] [phone] [id number]    Provider: Raynelle Bring, M.D.    Reason for Visit: voiding trial-Vickki Igou      * Signed by Raynelle Bring, M.D. on 07/14/20 at 5:44 PM (EDT)*

## 2020-08-13 ENCOUNTER — Ambulatory Visit (HOSPITAL_COMMUNITY): Payer: Federal, State, Local not specified - PPO | Admitting: Certified Registered Nurse Anesthetist

## 2020-08-13 ENCOUNTER — Ambulatory Visit (HOSPITAL_COMMUNITY)
Admission: RE | Admit: 2020-08-13 | Discharge: 2020-08-13 | Disposition: A | Discharge status: Home / Self Care | Payer: Federal, State, Local not specified - PPO | Source: Ambulatory Visit | Attending: Urology | Admitting: Urology

## 2020-08-13 ENCOUNTER — Encounter (HOSPITAL_COMMUNITY): Payer: Self-pay | Admitting: Urology

## 2020-08-13 ENCOUNTER — Encounter (HOSPITAL_COMMUNITY)
Admission: RE | Disposition: A | Discharge status: Home / Self Care | Payer: Self-pay | Source: Ambulatory Visit | Attending: Urology

## 2020-08-13 ENCOUNTER — Ambulatory Visit (HOSPITAL_COMMUNITY): Payer: Federal, State, Local not specified - PPO

## 2020-08-13 DIAGNOSIS — N202 Calculus of kidney with calculus of ureter: Secondary | ICD-10-CM | POA: Diagnosis not present

## 2020-08-13 DIAGNOSIS — Z888 Allergy status to other drugs, medicaments and biological substances status: Secondary | ICD-10-CM | POA: Diagnosis not present

## 2020-08-13 DIAGNOSIS — N201 Calculus of ureter: Secondary | ICD-10-CM | POA: Diagnosis not present

## 2020-08-13 DIAGNOSIS — Z8546 Personal history of malignant neoplasm of prostate: Secondary | ICD-10-CM | POA: Insufficient documentation

## 2020-08-13 DIAGNOSIS — Z466 Encounter for fitting and adjustment of urinary device: Secondary | ICD-10-CM | POA: Diagnosis not present

## 2020-08-13 DIAGNOSIS — Z20822 Contact with and (suspected) exposure to covid-19: Secondary | ICD-10-CM | POA: Insufficient documentation

## 2020-08-13 DIAGNOSIS — C61 Malignant neoplasm of prostate: Secondary | ICD-10-CM | POA: Diagnosis not present

## 2020-08-13 DIAGNOSIS — Z885 Allergy status to narcotic agent status: Secondary | ICD-10-CM | POA: Diagnosis not present

## 2020-08-13 DIAGNOSIS — Z9079 Acquired absence of other genital organ(s): Secondary | ICD-10-CM | POA: Insufficient documentation

## 2020-08-13 HISTORY — PX: CYSTOSCOPY/URETEROSCOPY/HOLMIUM LASER/STENT PLACEMENT: SHX6546

## 2020-08-13 SURGERY — CYSTOSCOPY/URETEROSCOPY/HOLMIUM LASER/STENT PLACEMENT
Anesthesia: General | Laterality: Right

## 2020-08-13 MED ORDER — CHLORHEXIDINE GLUCONATE 0.12 % MT SOLN
15.0000 mL | Freq: Once | OROMUCOSAL | Status: AC
  Administered 2020-08-13: 15 mL via OROMUCOSAL

## 2020-08-13 MED ORDER — LACTATED RINGERS IV SOLN: INTRAVENOUS | Status: DC

## 2020-08-13 MED ORDER — SODIUM CHLORIDE 0.9 % IR SOLN
Status: DC | PRN
  Administered 2020-08-13: 6000 mL

## 2020-08-13 MED ORDER — FENTANYL CITRATE (PF) 100 MCG/2ML IJ SOLN
INTRAMUSCULAR | Status: AC
  Filled 2020-08-13: qty 2

## 2020-08-13 MED ORDER — SODIUM CHLORIDE 0.9 % IV SOLN
2.0000 g | Freq: Once | INTRAVENOUS | Status: AC
  Administered 2020-08-13: 2 g via INTRAVENOUS
  Filled 2020-08-13: qty 20

## 2020-08-13 MED ORDER — TRAMADOL HCL 50 MG PO TABS
50.0000 mg | ORAL_TABLET | Freq: Four times a day (QID) | ORAL | 0 refills | Status: DC | PRN
Start: 2020-08-13 — End: 2021-07-13

## 2020-08-13 MED ORDER — PROPOFOL 10 MG/ML IV BOLUS
INTRAVENOUS | Status: AC
  Filled 2020-08-13: qty 20

## 2020-08-13 MED ORDER — LIDOCAINE HCL (PF) 2 % IJ SOLN
INTRAMUSCULAR | Status: AC
  Filled 2020-08-13: qty 5

## 2020-08-13 MED ORDER — KETOROLAC TROMETHAMINE 30 MG/ML IJ SOLN: 30.0000 mg | Freq: Once | INTRAMUSCULAR | Status: DC | PRN

## 2020-08-13 MED ORDER — IOHEXOL 300 MG/ML  SOLN
INTRAMUSCULAR | Status: DC | PRN
  Administered 2020-08-13: 16 mL

## 2020-08-13 MED ORDER — LIDOCAINE 2% (20 MG/ML) 5 ML SYRINGE
INTRAMUSCULAR | Status: DC | PRN
  Administered 2020-08-13: 100 mg via INTRAVENOUS

## 2020-08-13 MED ORDER — ONDANSETRON HCL 4 MG/2ML IJ SOLN
INTRAMUSCULAR | Status: DC | PRN
  Administered 2020-08-13: 4 mg via INTRAVENOUS

## 2020-08-13 MED ORDER — FENTANYL CITRATE (PF) 100 MCG/2ML IJ SOLN: 25.0000 ug | INTRAMUSCULAR | Status: DC | PRN

## 2020-08-13 MED ORDER — FENTANYL CITRATE (PF) 100 MCG/2ML IJ SOLN
INTRAMUSCULAR | Status: DC | PRN
  Administered 2020-08-13 (×2): 25 ug via INTRAVENOUS
  Administered 2020-08-13: 50 ug via INTRAVENOUS

## 2020-08-13 MED ORDER — ONDANSETRON HCL 4 MG/2ML IJ SOLN
INTRAMUSCULAR | Status: AC
  Filled 2020-08-13: qty 2

## 2020-08-13 MED ORDER — MIDAZOLAM HCL 2 MG/2ML IJ SOLN
INTRAMUSCULAR | Status: AC
  Filled 2020-08-13: qty 2

## 2020-08-13 MED ORDER — PROPOFOL 10 MG/ML IV BOLUS
INTRAVENOUS | Status: DC | PRN
  Administered 2020-08-13: 200 mg via INTRAVENOUS

## 2020-08-13 MED ORDER — MIDAZOLAM HCL 5 MG/5ML IJ SOLN
INTRAMUSCULAR | Status: DC | PRN
  Administered 2020-08-13: 2 mg via INTRAVENOUS

## 2020-08-13 MED ORDER — ORAL CARE MOUTH RINSE: 15.0000 mL | Freq: Once | OROMUCOSAL | Status: AC

## 2020-08-13 SURGICAL SUPPLY — 19 items
BAG URO CATCHER STRL LF (MISCELLANEOUS) ×2 IMPLANT
BASKET STONE 1.7 NGAGE (UROLOGICAL SUPPLIES) ×2 IMPLANT
BASKET ZERO TIP NITINOL 2.4FR (BASKET) IMPLANT
CATH INTERMIT  6FR 70CM (CATHETERS) ×2 IMPLANT
CLOTH BEACON ORANGE TIMEOUT ST (SAFETY) ×2 IMPLANT
GLOVE BIOGEL M STRL SZ7.5 (GLOVE) ×2 IMPLANT
GOWN STRL REUS W/TWL LRG LVL3 (GOWN DISPOSABLE) ×2 IMPLANT
GUIDEWIRE STR DUAL SENSOR (WIRE) ×2 IMPLANT
GUIDEWIRE ZIPWRE .038 STRAIGHT (WIRE) ×2 IMPLANT
KIT TURNOVER KIT A (KITS) IMPLANT
LASER FIB FLEXIVA PULSE ID 365 (Laser) IMPLANT
MANIFOLD NEPTUNE II (INSTRUMENTS) ×2 IMPLANT
PACK CYSTO (CUSTOM PROCEDURE TRAY) ×2 IMPLANT
SHEATH URETERAL 12FRX35CM (MISCELLANEOUS) ×2 IMPLANT
STENT URET 6FRX24 CONTOUR (STENTS) ×2 IMPLANT
TRACTIP FLEXIVA PULS ID 200XHI (Laser) IMPLANT
TRACTIP FLEXIVA PULSE ID 200 (Laser)
TUBING CONNECTING 10 (TUBING) ×2 IMPLANT
TUBING UROLOGY SET (TUBING) ×2 IMPLANT

## 2020-08-13 NOTE — OR Nursing (Signed)
Stone taken per Dr. Alinda Money

## 2020-08-13 NOTE — Anesthesia Preprocedure Evaluation (Signed)
Anesthesia Evaluation  Patient identified by MRN, date of birth, ID band Patient awake    Reviewed: Allergy & Precautions, NPO status , Patient's Chart, lab work & pertinent test results  Airway Mallampati: II  TM Distance: >3 FB Neck ROM: Full    Dental no notable dental hx.    Pulmonary neg pulmonary ROS,    Pulmonary exam normal breath sounds clear to auscultation       Cardiovascular negative cardio ROS Normal cardiovascular exam Rhythm:Regular Rate:Normal     Neuro/Psych negative neurological ROS  negative psych ROS   GI/Hepatic negative GI ROS, Neg liver ROS,   Endo/Other  negative endocrine ROS  Renal/GU negative Renal ROS  negative genitourinary   Musculoskeletal negative musculoskeletal ROS (+)   Abdominal   Peds negative pediatric ROS (+)  Hematology negative hematology ROS (+)   Anesthesia Other Findings   Reproductive/Obstetrics negative OB ROS                             Anesthesia Physical Anesthesia Plan  ASA: I  Anesthesia Plan: General   Post-op Pain Management:    Induction: Intravenous  PONV Risk Score and Plan: 2 and Ondansetron and Dexamethasone  Airway Management Planned: LMA  Additional Equipment:   Intra-op Plan:   Post-operative Plan: Extubation in OR  Informed Consent: I have reviewed the patients History and Physical, chart, labs and discussed the procedure including the risks, benefits and alternatives for the proposed anesthesia with the patient or authorized representative who has indicated his/her understanding and acceptance.     Dental advisory given  Plan Discussed with: CRNA and Surgeon  Anesthesia Plan Comments:         Anesthesia Quick Evaluation

## 2020-08-13 NOTE — Interval H&P Note (Signed)
History and Physical Interval Note:  08/13/2020 1:00 PM  Adrian [name]  has presented today for surgery, with the diagnosis of RIGHT URETERAL AND RENAL CALCULI.  The various methods of treatment have been discussed with the patient and family. After consideration of risks, benefits and other options for treatment, the patient has consented to  Procedure(s): CYSTOSCOPY/URETEROSCOPY/HOLMIUM LASER/STENT PLACEMENT (Right) as a surgical intervention.  The patient's history has been reviewed, patient examined, no change in status, stable for surgery.  I have reviewed the patient's chart and labs.  Questions were answered to the patient's satisfaction.     [employer]

## 2020-08-13 NOTE — Op Note (Signed)
Preoperative diagnosis: Right ureteral calculi  Postoperative diagnosis: Right ureteral calculi  Procedures: 1.  Cystoscopy 2.  Right retrograde pyelography with interpretation 3.  Right ureteroscopy with stone removal 4.  Right ureteral stent placement (6 x 24 with string)  Surgeon: [name] MD  Anesthesia: General  Complications: None  EBL: Minimal  Intraoperative findings: Right retrograde pyelography with performed with Omnipaque contrast.  This was injected through the ureteroscope and revealed no filling defects in the proximal ureter but a questionable filling defect in the renal pelvis.  Specimens: Right ureteral calculi  Disposition of specimens: [facility]  Indication: Adrian [name] is a 59 year old gentleman who recently underwent extracorporeal shockwave lithotripsy for a right sided 1 cm UPJ calculus.  He developed a postoperative Steinstrasse with multiple stones in the distal right ureter.  He was taken to the operating room for ureteroscopic removal a couple of weeks ago but was noted to have purulent drainage and therefore ureteral stent was placed.  He was treated with appropriate antibiotic therapy.  He returns today for definitive stone management and the above procedures.  The potential risks, complications, and the expected recovery process has been discussed in detail.  Informed consent was obtained.  Description of procedure: The patient was taken to the operating room and a general anesthetic was administered.  He was given preoperative antibiotics, placed in the dorsolithotomy position, and prepped and draped in the usual sterile fashion.  Next, a preoperative timeout was performed.  Cystourethroscopy was performed revealing a normal anterior and posterior urethra.  Inspection of the bladder revealed the indwelling right ureteral stent.  This was mildly encrusted.  Flexible graspers were used to remove this to the urethra.  A 0.38 sensor  guidewire was then advanced through the stent up into the right renal collecting system under fluoroscopic guidance.  Semirigid ureteroscopy was then performed.  There were multiple calculi in the distal right ureter.  Using an N gage basket, each of the stone fragments were able to be removed without further lithotripsy.  A total of approximately 8 stones were removed.  The semirigid ureteroscope was then advanced up into the mid ureter.  No further stone fragments were noted.  To ensure that he did not have any remaining ureteral stones, a second Glidewire was passed through the semirigid ureteroscope into the renal collecting system.  The semirigid ureteroscope was then removed and a flexible ureteroscope was advanced over the Glidewire.  This was passed into the distal ureter and the ureter was then visualized as it was passed upward.  The flexible ureteroscope could not be advanced all the way into the proximal ureter although no other stones were visually identified.  Omnipaque contrast was then injected through the flexible ureteroscope with findings as dictated above.  There was a questionable filling defect in the renal pelvis.  The flexible ureteroscope was then removed and a 12/14 35 cm ureteral access sheath was advanced over the wire into the proximal ureter.  The flexible ureteroscope was then advanced through the access sheath and into the renal collecting system.  There was noted to be a small amount of clot that likely accounted for the filling defect.  No further stone fragments were clearly identified.  The ureteroscope was then withdrawn and a guidewire was left in place as the access sheath was removed.  The wire was then backloaded on the cystoscope.  A 6 x 24 double-J ureteral stent was then advanced over the wire using Seldinger technique and  positioned appropriately under fluoroscopic and cystoscopic guidance.  The wire was removed with a good curl noted in the renal pelvis as well as within  the bladder.  The string tether was taped to the top of the penis.  The patient tolerated the procedure well without complications.  He was able to be awakened and transferred to recovery unit in satisfactory condition.

## 2020-08-13 NOTE — Anesthesia Procedure Notes (Signed)
Procedure Name: LMA Insertion Date/Time: 08/13/2020 1:19 PM Performed by: Maxwell Caul, CRNA Pre-anesthesia Checklist: Patient identified, Emergency Drugs available, Suction available and Patient being monitored Patient Re-evaluated:Patient Re-evaluated prior to induction Oxygen Delivery Method: Circle system utilized Preoxygenation: Pre-oxygenation with 100% oxygen Induction Type: IV induction LMA: LMA inserted LMA Size: 4.0 Number of attempts: 1 Placement Confirmation: positive ETCO2 and breath sounds checked- equal and bilateral Tube secured with: Tape Dental Injury: Teeth and Oropharynx as per pre-operative assessment

## 2020-08-13 NOTE — Discharge Instructions (Signed)
1. You may see some blood in the urine and may have some burning with urination for 48-72 hours. You also may notice that you have to urinate more frequently or urgently after your procedure which is normal.  2. You should call should you develop an inability urinate, fever > 101, persistent nausea and vomiting that prevents you from eating or drinking to stay hydrated.  3. If you have a stent, you will likely urinate more frequently and urgently until the stent is removed and you may experience some discomfort/pain in the lower abdomen and flank especially when urinating. You may take pain medication prescribed to you if needed for pain. You may also intermittently have blood in the urine until the stent is removed. You may remove your stent on Monday morning.  Simply pull the string that is taped to your body and the stent will easily come out.  This may be best done in the shower as some urine may come out with the stent.  Usually you will feel relief once the stent is removed, but occasionally patients can develop pain due to residual swelling of the ureter that may temporarily obstruct the kidney.  This can be managed by taking pain medication and it will typically resolve with time.  Please do not hesitate to call if you have pain that is not controlled with your pain medication or does not improved within 24-48 hours.

## 2020-08-13 NOTE — Transfer of Care (Signed)
Immediate Anesthesia Transfer of Care Note  Patient: Adrian [name]  Procedure(s) Performed: CYSTOSCOPY/URETEROSCOPY/RETROGRADE/ REMOVAL OF STONE/STENT EXCHANGE (Right )  Patient Location: PACU  Anesthesia Type:General  Level of Consciousness: drowsy  Airway & Oxygen Therapy: Patient Spontanous Breathing and Patient connected to face mask oxygen  Post-op Assessment: Report given to RN and Post -op Vital signs reviewed and stable  Post vital signs: Reviewed and stable  Last Vitals:  Vitals Value Taken Time  BP 140/98 08/13/20 1413  Temp    Pulse 68 08/13/20 1414  Resp 11 08/13/20 1414  SpO2 100 % 08/13/20 1414  Vitals shown include unvalidated device data.  Last Pain:  Vitals:   08/13/20 1222  TempSrc:   PainSc: 0-No pain         Complications: No complications documented.

## 2020-08-13 NOTE — Anesthesia Postprocedure Evaluation (Signed)
Anesthesia Post Note  Patient: Adrian [name]  Procedure(s) Performed: CYSTOSCOPY/URETEROSCOPY/RETROGRADE/ REMOVAL OF STONE/STENT EXCHANGE (Right )     Patient location during evaluation: PACU Anesthesia Type: General Level of consciousness: awake and alert Pain management: pain level controlled Vital Signs Assessment: post-procedure vital signs reviewed and stable Respiratory status: spontaneous breathing, nonlabored ventilation, respiratory function stable and patient connected to nasal cannula oxygen Cardiovascular status: blood pressure returned to baseline and stable Postop Assessment: no apparent nausea or vomiting Anesthetic complications: no   No complications documented.  Last Vitals:  Vitals:   08/13/20 1415 08/13/20 1445  BP: (!) 130/98 119/80  Pulse: 68 61  Resp: 11 15  Temp: 36.4 C   SpO2: 100% 95%    Last Pain:  Vitals:   08/13/20 1445  TempSrc:   PainSc: 0-No pain                 Catalia Massett S

## 2020-08-14 ENCOUNTER — Encounter (HOSPITAL_COMMUNITY): Payer: Self-pay | Admitting: Urology

## 2020-08-25 ENCOUNTER — Other Ambulatory Visit: Payer: Self-pay

## 2020-08-25 ENCOUNTER — Ambulatory Visit (INDEPENDENT_AMBULATORY_CARE_PROVIDER_SITE_OTHER): Payer: Federal, State, Local not specified - PPO | Admitting: Internal Medicine

## 2020-08-25 ENCOUNTER — Encounter: Payer: Self-pay | Admitting: Internal Medicine

## 2020-08-25 VITALS — BP 110/70 | HR 73 | Temp 98.1°F | Ht 71.0 in | Wt 199.5 lb

## 2020-08-25 DIAGNOSIS — N200 Calculus of kidney: Secondary | ICD-10-CM | POA: Diagnosis not present

## 2020-08-25 DIAGNOSIS — Z23 Encounter for immunization: Secondary | ICD-10-CM

## 2020-08-25 DIAGNOSIS — H6123 Impacted cerumen, bilateral: Secondary | ICD-10-CM

## 2020-08-25 DIAGNOSIS — C61 Malignant neoplasm of prostate: Secondary | ICD-10-CM | POA: Diagnosis not present

## 2020-08-25 DIAGNOSIS — Z0000 Encounter for general adult medical examination without abnormal findings: Secondary | ICD-10-CM

## 2020-08-25 NOTE — Addendum Note (Signed)
Addended by: Westley Hummer B on: 08/25/2020 10:04 AM   Modules accepted: Orders

## 2020-08-25 NOTE — Progress Notes (Signed)
Established Patient Office Visit     This visit occurred during the SARS-CoV-2 public health emergency.  Safety protocols were in place, including screening questions prior to the visit, additional usage of staff PPE, and extensive cleaning of exam room while observing appropriate contact time as indicated for disinfecting solutions.    CC/Reason for Visit: Annual preventive exam  HPI: MICHA [name] is a 59 y.o. male who is coming in today for the above mentioned reasons. Past Medical History is significant for: Prostate cancer.  In November he started experiencing abdominal pain.  He had to have lithotripsy for nephrolithiasis with stent placement and subsequent removal.  He is still recovering from this.  He is due for his Covid booster in January, flu vaccine today, otherwise immunizations are up-to-date.  He is overdue for eye and dental care.  He had a colonoscopy in 2012.  He has no acute complaints today.   Past Medical/Surgical History: Past Medical History:  Diagnosis Date  . Cancer [facility])    prostate  . Dental crowns present   . History of kidney stones   . Plantar fasciitis of left foot 05/2013    Past Surgical History:  Procedure Laterality Date  . BREAST ENHANCEMENT SURGERY Left    asymmetry of chest wall  . CLOSED REDUCTION METACARPAL WITH PERCUTANEOUS PINNING Right 12/22/2004   5th metacarpal  . CYSTOSCOPY WITH RETROGRADE PYELOGRAM, URETEROSCOPY AND STENT PLACEMENT Right 07/13/2020   Procedure: CYSTOSCOPY WITH RETROGRADE PYELOGRAM, RIGHT URETERAL STENT PLACEMENT;  Surgeon: Ceasar Mons, MD;  Location: [facility];  Service: Urology;  Laterality: Right;  . CYSTOSCOPY/URETEROSCOPY/HOLMIUM LASER/STENT PLACEMENT Right 08/13/2020   Procedure: CYSTOSCOPY/URETEROSCOPY/RETROGRADE/ REMOVAL OF STONE/STENT EXCHANGE;  Surgeon: Raynelle Bring, MD;  Location: [facility];  Service: Urology;  Laterality: Right;  . EXTRACORPOREAL SHOCK WAVE LITHOTRIPSY Right 07/09/2020    Procedure: EXTRACORPOREAL SHOCK WAVE LITHOTRIPSY (ESWL);  Surgeon: Raynelle Bring, MD;  Location: [facility];  Service: Urology;  Laterality: Right;  . KNEE ARTHROSCOPY Left   . LITHOTRIPSY    . LYMPHADENECTOMY Bilateral 02/21/2019   Procedure: LYMPHADENECTOMY, PELVIC;  Surgeon: Raynelle Bring, MD;  Location: [facility];  Service: Urology;  Laterality: Bilateral;  . MANDIBLE SURGERY    . PLANTAR FASCIA RELEASE  06/28/2012   Procedure: ENDOSCOPIC PLANTAR FASCIOTOMY;  Surgeon: Ninetta Lights, MD;  Location: [place];  Service: Orthopedics;  Laterality: Right;  . PLANTAR FASCIA RELEASE Left 06/13/2013   Procedure: ENDOSCOPIC PLANTAR FASCIOTOMY;  Surgeon: Ninetta Lights, MD;  Location: [place];  Service: Orthopedics;  Laterality: Left;  . ROBOT ASSISTED LAPAROSCOPIC RADICAL PROSTATECTOMY N/A 02/21/2019   Procedure: XI ROBOTIC ASSISTED LAPAROSCOPIC RADICAL PROSTATECTOMY LEVEL 2;  Surgeon: Raynelle Bring, MD;  Location: [facility];  Service: Urology;  Laterality: N/A;    Social History:  reports that he has never smoked. He has never used smokeless tobacco. He reports that he does not drink alcohol and does not use drugs.  Allergies: Allergies  Allergen Reactions  . Oxycodone Nausea And Vomiting  . Hydrocodone Nausea And Vomiting    Family History:  No history of heart disease, cancer, stroke that he is aware of  Current Outpatient Medications:  .  ibuprofen (ADVIL) 200 MG tablet, Take 400 mg by mouth every 6 (six) hours as needed for moderate pain., Disp: , Rfl:  .  ketorolac (TORADOL) 10 MG tablet, Take 10 mg by mouth every 6 (six) hours as needed for pain., Disp: , Rfl:  .  tamsulosin (FLOMAX) 0.4 MG CAPS capsule, Take 1 capsule (0.4 mg total) by mouth at bedtime., Disp: 14 capsule, Rfl: 0 .  traMADol (ULTRAM) 50 MG tablet, Take 1-2 tablets (50-100 mg total) by mouth every 6 (six) hours as needed (pain)., Disp: 15 tablet, Rfl: 0  Review of  Systems:  Constitutional: Denies fever, chills, diaphoresis, appetite change and fatigue.  HEENT: Denies photophobia, eye pain, redness, hearing loss, ear pain, congestion, sore throat, rhinorrhea, sneezing, mouth sores, trouble swallowing, neck pain, neck stiffness and tinnitus.   Respiratory: Denies SOB, DOE, cough, chest tightness,  and wheezing.   Cardiovascular: Denies chest pain, palpitations and leg swelling.  Gastrointestinal: Denies nausea, vomiting, abdominal pain, diarrhea, constipation, blood in stool and abdominal distention.  Genitourinary: Denies dysuria, urgency, frequency, hematuria, flank pain and difficulty urinating.  Endocrine: Denies: hot or cold intolerance, sweats, changes in hair or nails, polyuria, polydipsia. Musculoskeletal: Denies myalgias, back pain, joint swelling, arthralgias and gait problem.  Skin: Denies pallor, rash and wound.  Neurological: Denies dizziness, seizures, syncope, weakness, light-headedness, numbness and headaches.  Hematological: Denies adenopathy. Easy bruising, personal or family bleeding history  Psychiatric/Behavioral: Denies suicidal ideation, mood changes, confusion, nervousness, sleep disturbance and agitation    Physical Exam: Vitals:   08/25/20 0806  BP: 110/70  Pulse: 73  Temp: 98.1 F (36.7 C)  TempSrc: Oral  SpO2: 96%  Weight: 199 lb 8 oz (90.5 kg)  Height: 5' 11"  (1.803 m)    Body mass index is 27.82 kg/m.   Constitutional: NAD, calm, comfortable Eyes: PERRL, lids and conjunctivae normal ENMT: Mucous membranes are moist. Posterior pharynx clear of any exudate or lesions. Normal dentition. Tympanic membrane is obstructed by cerumen bilaterally. Neck: normal, supple, no masses, no thyromegaly Respiratory: clear to auscultation bilaterally, no wheezing, no crackles. Normal respiratory effort. No accessory muscle use.  Cardiovascular: Regular rate and rhythm, no murmurs / rubs / gallops. No extremity edema. 2+ pedal  pulses.  Abdomen: no tenderness, no masses palpated. No hepatosplenomegaly. Bowel sounds positive.  Musculoskeletal: no clubbing / cyanosis. No joint deformity upper and lower extremities. Good ROM, no contractures. Normal muscle tone.  Skin: no rashes, lesions, ulcers. No induration Neurologic: CN 2-12 grossly intact. Sensation intact, DTR normal. Strength 5/5 in all 4.  Psychiatric: Normal judgment and insight. Alert and oriented x 3. Normal mood.    Impression and Plan:  Encounter for preventive health examination  -Advised routine eye and dental care. -Flu vaccine today, otherwise immunizations are up-to-date. -Screening labs today. -Healthy lifestyle discussed in detail. -He had a colonoscopy in 2012 and is a 10-year callback.  Prostate cancer ([place])  Nephrolithiasis -Noted, under the care of urology.  Need for influenza vaccination -Flu vaccine administered today.     Patient Instructions  -Nice seeing you today!!  -return fasting for lab work.  -flu vaccine today.  -Remember your COVID vaccine in January.  -Schedule follow up in 1 year or sooner as needed.   Preventive Care 29-13 Years Old, Male Preventive care refers to lifestyle choices and visits with your health care provider that can promote health and wellness. This includes:  A yearly physical exam. This is also called an annual well check.  Regular dental and eye exams.  Immunizations.  Screening for certain conditions.  Healthy lifestyle choices, such as eating a healthy diet, getting regular exercise, not using drugs or products that contain nicotine and tobacco, and limiting alcohol use. What can I expect for my preventive care visit? Physical exam Your health  care provider will check:  Height and weight. These may be used to calculate body mass index (BMI), which is a measurement that tells if you are at a healthy weight.  Heart rate and blood pressure.  Your skin for abnormal  spots. Counseling Your health care provider may ask you questions about:  Alcohol, tobacco, and drug use.  Emotional well-being.  Home and relationship well-being.  Sexual activity.  Eating habits.  Work and work Statistician. What immunizations do I need?  Influenza (flu) vaccine  This is recommended every year. Tetanus, diphtheria, and pertussis (Tdap) vaccine  You may need a Td booster every 10 years. Varicella (chickenpox) vaccine  You may need this vaccine if you have not already been vaccinated. Zoster (shingles) vaccine  You may need this after age 52. Measles, mumps, and rubella (MMR) vaccine  You may need at least one dose of MMR if you were born in 1957 or later. You may also need a second dose. Pneumococcal conjugate (PCV13) vaccine  You may need this if you have certain conditions and were not previously vaccinated. Pneumococcal polysaccharide (PPSV23) vaccine  You may need one or two doses if you smoke cigarettes or if you have certain conditions. Meningococcal conjugate (MenACWY) vaccine  You may need this if you have certain conditions. Hepatitis A vaccine  You may need this if you have certain conditions or if you travel or work in places where you may be exposed to hepatitis A. Hepatitis B vaccine  You may need this if you have certain conditions or if you travel or work in places where you may be exposed to hepatitis B. Haemophilus influenzae type b (Hib) vaccine  You may need this if you have certain risk factors. Human papillomavirus (HPV) vaccine  If recommended by your health care provider, you may need three doses over 6 months. You may receive vaccines as individual doses or as more than one vaccine together in one shot (combination vaccines). Talk with your health care provider about the risks and benefits of combination vaccines. What tests do I need? Blood tests  Lipid and cholesterol levels. These may be checked every 5 years, or  more frequently if you are over 53 years old.  Hepatitis C test.  Hepatitis B test. Screening  Lung cancer screening. You may have this screening every year starting at age 6 if you have a 30-pack-year history of smoking and currently smoke or have quit within the past 15 years.  Prostate cancer screening. Recommendations will vary depending on your family history and other risks.  Colorectal cancer screening. All adults should have this screening starting at age 32 and continuing until age 12. Your health care provider may recommend screening at age 78 if you are at increased risk. You will have tests every 1-10 years, depending on your results and the type of screening test.  Diabetes screening. This is done by checking your blood sugar (glucose) after you have not eaten for a while (fasting). You may have this done every 1-3 years.  Sexually transmitted disease (STD) testing. Follow these instructions at home: Eating and drinking  Eat a diet that includes fresh fruits and vegetables, whole grains, lean protein, and low-fat dairy products.  Take vitamin and mineral supplements as recommended by your health care provider.  Do not drink alcohol if your health care provider tells you not to drink.  If you drink alcohol: ? Limit how much you have to 0-2 drinks a day. ? Be aware of  how much alcohol is in your drink. In the [place] one drink equals one 12 oz bottle of beer (355 mL), one 5 oz glass of wine (148 mL), or one 1 oz glass of hard liquor (44 mL). Lifestyle  Take daily care of your teeth and gums.  Stay active. Exercise for at least 30 minutes on 5 or more days each week.  Do not use any products that contain nicotine or tobacco, such as cigarettes, e-cigarettes, and chewing tobacco. If you need help quitting, ask your health care provider.  If you are sexually active, practice safe sex. Use a condom or other form of protection to prevent STIs (sexually transmitted  infections).  Talk with your health care provider about taking a low-dose aspirin every day starting at age 4. What's next?  Go to your health care provider once a year for a well check visit.  Ask your health care provider how often you should have your eyes and teeth checked.  Stay up to date on all vaccines. This information is not intended to replace advice given to you by your health care provider. Make sure you discuss any questions you have with your health care provider. Document Revised: 08/23/2018 Document Reviewed: 08/23/2018 Elsevier Patient Education  2020 [place], MD [facility]

## 2020-08-25 NOTE — Patient Instructions (Signed)
-Nice seeing you today!!  -return fasting for lab work.  -flu vaccine today.  -Remember your COVID vaccine in January.  -Schedule follow up in 1 year or sooner as needed.   Preventive Care 58-61 Years Old, Male Preventive care refers to lifestyle choices and visits with your health care provider that can promote health and wellness. This includes:  A yearly physical exam. This is also called an annual well check.  Regular dental and eye exams.  Immunizations.  Screening for certain conditions.  Healthy lifestyle choices, such as eating a healthy diet, getting regular exercise, not using drugs or products that contain nicotine and tobacco, and limiting alcohol use. What can I expect for my preventive care visit? Physical exam Your health care provider will check:  Height and weight. These may be used to calculate body mass index (BMI), which is a measurement that tells if you are at a healthy weight.  Heart rate and blood pressure.  Your skin for abnormal spots. Counseling Your health care provider may ask you questions about:  Alcohol, tobacco, and drug use.  Emotional well-being.  Home and relationship well-being.  Sexual activity.  Eating habits.  Work and work Statistician. What immunizations do I need?  Influenza (flu) vaccine  This is recommended every year. Tetanus, diphtheria, and pertussis (Tdap) vaccine  You may need a Td booster every 10 years. Varicella (chickenpox) vaccine  You may need this vaccine if you have not already been vaccinated. Zoster (shingles) vaccine  You may need this after age 66. Measles, mumps, and rubella (MMR) vaccine  You may need at least one dose of MMR if you were born in 1957 or later. You may also need a second dose. Pneumococcal conjugate (PCV13) vaccine  You may need this if you have certain conditions and were not previously vaccinated. Pneumococcal polysaccharide (PPSV23) vaccine  You may need one or two  doses if you smoke cigarettes or if you have certain conditions. Meningococcal conjugate (MenACWY) vaccine  You may need this if you have certain conditions. Hepatitis A vaccine  You may need this if you have certain conditions or if you travel or work in places where you may be exposed to hepatitis A. Hepatitis B vaccine  You may need this if you have certain conditions or if you travel or work in places where you may be exposed to hepatitis B. Haemophilus influenzae type b (Hib) vaccine  You may need this if you have certain risk factors. Human papillomavirus (HPV) vaccine  If recommended by your health care provider, you may need three doses over 6 months. You may receive vaccines as individual doses or as more than one vaccine together in one shot (combination vaccines). Talk with your health care provider about the risks and benefits of combination vaccines. What tests do I need? Blood tests  Lipid and cholesterol levels. These may be checked every 5 years, or more frequently if you are over 61 years old.  Hepatitis C test.  Hepatitis B test. Screening  Lung cancer screening. You may have this screening every year starting at age 30 if you have a 30-pack-year history of smoking and currently smoke or have quit within the past 15 years.  Prostate cancer screening. Recommendations will vary depending on your family history and other risks.  Colorectal cancer screening. All adults should have this screening starting at age 55 and continuing until age 62. Your health care provider may recommend screening at age 86 if you are at increased risk.  You will have tests every 1-10 years, depending on your results and the type of screening test.  Diabetes screening. This is done by checking your blood sugar (glucose) after you have not eaten for a while (fasting). You may have this done every 1-3 years.  Sexually transmitted disease (STD) testing. Follow these instructions at  home: Eating and drinking  Eat a diet that includes fresh fruits and vegetables, whole grains, lean protein, and low-fat dairy products.  Take vitamin and mineral supplements as recommended by your health care provider.  Do not drink alcohol if your health care provider tells you not to drink.  If you drink alcohol: ? Limit how much you have to 0-2 drinks a day. ? Be aware of how much alcohol is in your drink. In the [place] one drink equals one 12 oz bottle of beer (355 mL), one 5 oz glass of wine (148 mL), or one 1 oz glass of hard liquor (44 mL). Lifestyle  Take daily care of your teeth and gums.  Stay active. Exercise for at least 30 minutes on 5 or more days each week.  Do not use any products that contain nicotine or tobacco, such as cigarettes, e-cigarettes, and chewing tobacco. If you need help quitting, ask your health care provider.  If you are sexually active, practice safe sex. Use a condom or other form of protection to prevent STIs (sexually transmitted infections).  Talk with your health care provider about taking a low-dose aspirin every day starting at age 4. What's next?  Go to your health care provider once a year for a well check visit.  Ask your health care provider how often you should have your eyes and teeth checked.  Stay up to date on all vaccines. This information is not intended to replace advice given to you by your health care provider. Make sure you discuss any questions you have with your health care provider. Document Revised: 08/23/2018 Document Reviewed: 08/23/2018 Elsevier Patient Education  2020 [employer].

## 2020-08-27 ENCOUNTER — Other Ambulatory Visit: Payer: Self-pay

## 2020-08-27 ENCOUNTER — Other Ambulatory Visit (INDEPENDENT_AMBULATORY_CARE_PROVIDER_SITE_OTHER): Payer: Federal, State, Local not specified - PPO

## 2020-08-27 DIAGNOSIS — Z0000 Encounter for general adult medical examination without abnormal findings: Secondary | ICD-10-CM | POA: Diagnosis not present

## 2020-08-27 DIAGNOSIS — C61 Malignant neoplasm of prostate: Secondary | ICD-10-CM

## 2020-08-28 ENCOUNTER — Other Ambulatory Visit: Payer: Self-pay | Admitting: Internal Medicine

## 2020-08-28 ENCOUNTER — Encounter: Payer: Self-pay | Admitting: Internal Medicine

## 2020-08-28 DIAGNOSIS — E559 Vitamin D deficiency, unspecified: Secondary | ICD-10-CM

## 2020-08-28 LAB — CBC WITH DIFFERENTIAL/PLATELET
Absolute Monocytes: 578 cells/uL (ref 200–950)
Basophils Absolute: 90 cells/uL (ref 0–200)
Basophils Relative: 1.7 %
Eosinophils Absolute: 159 cells/uL (ref 15–500)
Eosinophils Relative: 3 %
HCT: 46 % (ref 38.5–50.0)
Hemoglobin: 15.9 g/dL (ref 13.2–17.1)
Lymphs Abs: 2078 cells/uL (ref 850–3900)
MCH: 29.8 pg (ref 27.0–33.0)
MCHC: 34.6 g/dL (ref 32.0–36.0)
MCV: 86.3 fL (ref 80.0–100.0)
MPV: 9.6 fL (ref 7.5–12.5)
Monocytes Relative: 10.9 %
Neutro Abs: 2396 cells/uL (ref 1500–7800)
Neutrophils Relative %: 45.2 %
Platelets: 297 10*3/uL (ref 140–400)
RBC: 5.33 10*6/uL (ref 4.20–5.80)
RDW: 12.6 % (ref 11.0–15.0)
Total Lymphocyte: 39.2 %
WBC: 5.3 10*3/uL (ref 3.8–10.8)

## 2020-08-28 LAB — LIPID PANEL
Cholesterol: 181 mg/dL (ref <200)
HDL: 55 mg/dL (ref >=40)
LDL Cholesterol (Calc): 111 mg/dL (calc) — ABNORMAL HIGH
Non-HDL Cholesterol (Calc): 126 mg/dL (calc) (ref <130)
Total CHOL/HDL Ratio: 3.3 (calc) (ref <5.0)
Triglycerides: 68 mg/dL (ref <150)

## 2020-08-28 LAB — COMPREHENSIVE METABOLIC PANEL
AG Ratio: 2 (calc) (ref 1.0–2.5)
ALT: 14 U/L (ref 9–46)
AST: 16 U/L (ref 10–35)
Albumin: 4.3 g/dL (ref 3.6–5.1)
Alkaline phosphatase (APISO): 71 U/L (ref 35–144)
BUN/Creatinine Ratio: 16 (calc) (ref 6–22)
BUN: 20 mg/dL (ref 7–25)
CO2: 26 mmol/L (ref 20–32)
Calcium: 9.4 mg/dL (ref 8.6–10.3)
Chloride: 106 mmol/L (ref 98–110)
Creat: 1.29 mg/dL — ABNORMAL HIGH (ref 0.70–1.25)
Globulin: 2.2 g/dL (calc) (ref 1.9–3.7)
Glucose, Bld: 101 mg/dL — ABNORMAL HIGH (ref 65–99)
Potassium: 4.4 mmol/L (ref 3.5–5.3)
Sodium: 138 mmol/L (ref 135–146)
Total Bilirubin: 0.5 mg/dL (ref 0.2–1.2)
Total Protein: 6.5 g/dL (ref 6.1–8.1)

## 2020-08-28 LAB — VITAMIN B12: Vitamin B-12: 330 pg/mL (ref 200–1100)

## 2020-08-28 LAB — HEMOGLOBIN A1C
Hgb A1c MFr Bld: 5.6 % of total Hgb (ref <5.7)
Mean Plasma Glucose: 114 mg/dL
eAG (mmol/L): 6.3 mmol/L

## 2020-08-28 LAB — TSH: TSH: 1.04 mIU/L (ref 0.40–4.50)

## 2020-08-28 LAB — VITAMIN D 25 HYDROXY (VIT D DEFICIENCY, FRACTURES): Vit D, 25-Hydroxy: 35 ng/mL (ref 30–100)

## 2020-08-28 MED ORDER — VITAMIN D (ERGOCALCIFEROL) 1.25 MG (50000 UNIT) PO CAPS: 50000.0000 [IU] | ORAL_CAPSULE | ORAL | 0 refills | Status: AC

## 2020-09-01 DIAGNOSIS — N201 Calculus of ureter: Secondary | ICD-10-CM | POA: Diagnosis not present

## 2020-09-07 ENCOUNTER — Telehealth: Payer: Federal, State, Local not specified - PPO | Admitting: Family

## 2020-09-07 DIAGNOSIS — R0602 Shortness of breath: Secondary | ICD-10-CM

## 2020-09-07 NOTE — Progress Notes (Signed)
Based on what you shared with me, I feel your condition warrants further evaluation and I recommend that you be seen for a face to face office visit.  Given your symptoms, you need to be seen face to face today. I recommend going to the Urgent Care today to be evaluated.    NOTE: If you entered your credit card information for this eVisit, you will not be charged. You may see a "hold" on your card for the $35 but that hold will drop off and you will not have a charge processed.   If you are having a true medical emergency please call 911.      For an urgent face to face visit, Adrian [name] has five urgent care centers for your convenience:     [facility] at [facility] Directions [phone] [address] [place], [place] [address] . 10 am - 6pm Monday - Friday    [facility]) Get Driving Directions [phone] [address] [place], [place] [address] . 10 am to 8 pm Monday-Friday . 12 pm to 8 pm [facility] at [facility] Get Driving Directions [phone] [address] [place], [place] [address] . 8 am to 8 pm Monday-Friday . 9 am to 6 pm Saturday . 11 am to 6 pm Sunday     [facility] at [facility] Get Driving Directions  [phone] [address] [place], [place] [address] . 8 am to 8 pm Monday-Friday . 8 am to 4 pm [facility] Care at [facility] Directions [phone] [address] [place], [place] [address] . 12 pm to 6 pm Monday-Friday      Your e-visit answers were reviewed by a board certified advanced clinical practitioner to complete your personal care plan.  Thank you for using e-Visits.

## 2020-09-08 DIAGNOSIS — Z20828 Contact with and (suspected) exposure to other viral communicable diseases: Secondary | ICD-10-CM | POA: Diagnosis not present

## 2020-09-08 DIAGNOSIS — R051 Acute cough: Secondary | ICD-10-CM | POA: Diagnosis not present

## 2020-09-16 DIAGNOSIS — R051 Acute cough: Secondary | ICD-10-CM | POA: Diagnosis not present

## 2020-09-16 DIAGNOSIS — J209 Acute bronchitis, unspecified: Secondary | ICD-10-CM | POA: Diagnosis not present

## 2020-09-16 DIAGNOSIS — Z20828 Contact with and (suspected) exposure to other viral communicable diseases: Secondary | ICD-10-CM | POA: Diagnosis not present

## 2020-09-16 DIAGNOSIS — R0981 Nasal congestion: Secondary | ICD-10-CM | POA: Diagnosis not present

## 2020-09-22 DIAGNOSIS — N202 Calculus of kidney with calculus of ureter: Secondary | ICD-10-CM | POA: Diagnosis not present

## 2020-11-08 DIAGNOSIS — J0100 Acute maxillary sinusitis, unspecified: Secondary | ICD-10-CM | POA: Diagnosis not present

## 2020-11-08 DIAGNOSIS — R42 Dizziness and giddiness: Secondary | ICD-10-CM | POA: Diagnosis not present

## 2021-01-15 DIAGNOSIS — R350 Frequency of micturition: Secondary | ICD-10-CM | POA: Diagnosis not present

## 2021-01-15 DIAGNOSIS — M545 Low back pain, unspecified: Secondary | ICD-10-CM | POA: Diagnosis not present

## 2021-01-15 DIAGNOSIS — N3001 Acute cystitis with hematuria: Secondary | ICD-10-CM | POA: Diagnosis not present

## 2021-01-15 DIAGNOSIS — N3091 Cystitis, unspecified with hematuria: Secondary | ICD-10-CM | POA: Diagnosis not present

## 2021-01-20 DIAGNOSIS — N202 Calculus of kidney with calculus of ureter: Secondary | ICD-10-CM | POA: Diagnosis not present

## 2021-01-20 DIAGNOSIS — N2882 Megaloureter: Secondary | ICD-10-CM | POA: Diagnosis not present

## 2021-01-20 DIAGNOSIS — R3129 Other microscopic hematuria: Secondary | ICD-10-CM | POA: Diagnosis not present

## 2021-01-20 DIAGNOSIS — K5730 Diverticulosis of large intestine without perforation or abscess without bleeding: Secondary | ICD-10-CM | POA: Diagnosis not present

## 2021-01-20 DIAGNOSIS — R1084 Generalized abdominal pain: Secondary | ICD-10-CM | POA: Diagnosis not present

## 2021-03-16 DIAGNOSIS — C61 Malignant neoplasm of prostate: Secondary | ICD-10-CM | POA: Diagnosis not present

## 2021-03-16 DIAGNOSIS — N202 Calculus of kidney with calculus of ureter: Secondary | ICD-10-CM | POA: Diagnosis not present

## 2021-03-16 LAB — PSA: PSA: <0.015

## 2021-04-03 ENCOUNTER — Encounter: Payer: Self-pay | Admitting: Gastroenterology

## 2021-04-05 DIAGNOSIS — N5201 Erectile dysfunction due to arterial insufficiency: Secondary | ICD-10-CM | POA: Diagnosis not present

## 2021-04-05 DIAGNOSIS — C61 Malignant neoplasm of prostate: Secondary | ICD-10-CM | POA: Diagnosis not present

## 2021-04-05 DIAGNOSIS — N200 Calculus of kidney: Secondary | ICD-10-CM | POA: Diagnosis not present

## 2021-04-08 ENCOUNTER — Encounter: Payer: Self-pay | Admitting: Internal Medicine

## 2021-04-11 DIAGNOSIS — J0100 Acute maxillary sinusitis, unspecified: Secondary | ICD-10-CM | POA: Diagnosis not present

## 2021-04-11 DIAGNOSIS — R059 Cough, unspecified: Secondary | ICD-10-CM | POA: Diagnosis not present

## 2021-04-11 DIAGNOSIS — R519 Headache, unspecified: Secondary | ICD-10-CM | POA: Diagnosis not present

## 2021-04-11 DIAGNOSIS — Z20828 Contact with and (suspected) exposure to other viral communicable diseases: Secondary | ICD-10-CM | POA: Diagnosis not present

## 2021-06-01 DIAGNOSIS — L821 Other seborrheic keratosis: Secondary | ICD-10-CM | POA: Diagnosis not present

## 2021-06-21 DIAGNOSIS — Z20828 Contact with and (suspected) exposure to other viral communicable diseases: Secondary | ICD-10-CM | POA: Diagnosis not present

## 2021-06-21 DIAGNOSIS — J069 Acute upper respiratory infection, unspecified: Secondary | ICD-10-CM | POA: Diagnosis not present

## 2021-07-12 ENCOUNTER — Other Ambulatory Visit: Payer: Self-pay

## 2021-07-13 ENCOUNTER — Encounter: Payer: Self-pay | Admitting: Internal Medicine

## 2021-07-13 ENCOUNTER — Ambulatory Visit (INDEPENDENT_AMBULATORY_CARE_PROVIDER_SITE_OTHER): Payer: Federal, State, Local not specified - PPO | Admitting: Internal Medicine

## 2021-07-13 VITALS — BP 110/80 | HR 82 | Temp 97.8°F | Wt 200.9 lb

## 2021-07-13 DIAGNOSIS — E559 Vitamin D deficiency, unspecified: Secondary | ICD-10-CM

## 2021-07-13 DIAGNOSIS — Z0000 Encounter for general adult medical examination without abnormal findings: Secondary | ICD-10-CM | POA: Diagnosis not present

## 2021-07-13 DIAGNOSIS — Z1211 Encounter for screening for malignant neoplasm of colon: Secondary | ICD-10-CM | POA: Diagnosis not present

## 2021-07-13 DIAGNOSIS — H6123 Impacted cerumen, bilateral: Secondary | ICD-10-CM | POA: Diagnosis not present

## 2021-07-13 DIAGNOSIS — Z23 Encounter for immunization: Secondary | ICD-10-CM | POA: Diagnosis not present

## 2021-07-13 DIAGNOSIS — I700 Atherosclerosis of aorta: Secondary | ICD-10-CM

## 2021-07-13 DIAGNOSIS — C61 Malignant neoplasm of prostate: Secondary | ICD-10-CM

## 2021-07-13 DIAGNOSIS — R053 Chronic cough: Secondary | ICD-10-CM | POA: Diagnosis not present

## 2021-07-13 LAB — COMPREHENSIVE METABOLIC PANEL
ALT: 16 U/L (ref 0–53)
AST: 20 U/L (ref 0–37)
Albumin: 4.3 g/dL (ref 3.5–5.2)
Alkaline Phosphatase: 83 U/L (ref 39–117)
BUN: 20 mg/dL (ref 6–23)
CO2: 25 mEq/L (ref 19–32)
Calcium: 9.2 mg/dL (ref 8.4–10.5)
Chloride: 107 mEq/L (ref 96–112)
Creatinine, Ser: 1.06 mg/dL (ref 0.40–1.50)
GFR: 75.56 mL/min (ref >60.00)
Glucose, Bld: 94 mg/dL (ref 70–99)
Potassium: 4.2 mEq/L (ref 3.5–5.1)
Sodium: 140 mEq/L (ref 135–145)
Total Bilirubin: 0.5 mg/dL (ref 0.2–1.2)
Total Protein: 7.1 g/dL (ref 6.0–8.3)

## 2021-07-13 LAB — LIPID PANEL
Cholesterol: 160 mg/dL (ref 0–200)
HDL: 50.7 mg/dL (ref >39.00)
LDL Cholesterol: 95 mg/dL (ref 0–99)
NonHDL: 109.11
Total CHOL/HDL Ratio: 3
Triglycerides: 69 mg/dL (ref 0.0–149.0)
VLDL: 13.8 mg/dL (ref 0.0–40.0)

## 2021-07-13 LAB — CBC WITH DIFFERENTIAL/PLATELET
Basophils Absolute: 0.1 10*3/uL (ref 0.0–0.1)
Basophils Relative: 1.2 % (ref 0.0–3.0)
Eosinophils Absolute: 0.2 10*3/uL (ref 0.0–0.7)
Eosinophils Relative: 2.7 % (ref 0.0–5.0)
HCT: 45.9 % (ref 39.0–52.0)
Hemoglobin: 15.6 g/dL (ref 13.0–17.0)
Lymphocytes Relative: 31.3 % (ref 12.0–46.0)
Lymphs Abs: 2.1 10*3/uL (ref 0.7–4.0)
MCHC: 34 g/dL (ref 30.0–36.0)
MCV: 87.4 fl (ref 78.0–100.0)
Monocytes Absolute: 0.6 10*3/uL (ref 0.1–1.0)
Monocytes Relative: 9.1 % (ref 3.0–12.0)
Neutro Abs: 3.7 10*3/uL (ref 1.4–7.7)
Neutrophils Relative %: 55.7 % (ref 43.0–77.0)
Platelets: 240 10*3/uL (ref 150.0–400.0)
RBC: 5.25 Mil/uL (ref 4.22–5.81)
RDW: 13.1 % (ref 11.5–15.5)
WBC: 6.6 10*3/uL (ref 4.0–10.5)

## 2021-07-13 LAB — VITAMIN B12: Vitamin B-12: 340 pg/mL (ref 211–911)

## 2021-07-13 LAB — VITAMIN D 25 HYDROXY (VIT D DEFICIENCY, FRACTURES): VITD: 43.48 ng/mL (ref 30.00–100.00)

## 2021-07-13 LAB — HEMOGLOBIN A1C: Hgb A1c MFr Bld: 5.6 % (ref 4.6–6.5)

## 2021-07-13 LAB — TSH: TSH: 1.17 u[IU]/mL (ref 0.35–5.50)

## 2021-07-13 NOTE — Addendum Note (Signed)
Addended by: Westley Hummer B on: 07/13/2021 05:22 PM   Modules accepted: Orders

## 2021-07-13 NOTE — Patient Instructions (Signed)
-  Nice seeing you today!!  -Lab work today; will notify you once results are available.  -Flu vaccine today.  -Schedule follow up in 6 months.

## 2021-07-13 NOTE — Addendum Note (Signed)
Addended by: Rosalyn Gess D on: 07/13/2021 10:28 AM   Modules accepted: Orders

## 2021-07-13 NOTE — Progress Notes (Signed)
Established Patient Office Visit     This visit occurred during the SARS-CoV-2 public health emergency.  Safety protocols were in place, including screening questions prior to the visit, additional usage of staff PPE, and extensive cleaning of exam room while observing appropriate contact time as indicated for disinfecting solutions.    CC/Reason for Visit: Annual preventive exam and discuss some acute concerns  HPI: Adrian [name] is a 60 y.o. male who is coming in today for the above mentioned reasons. Past Medical History is significant for: Prostate cancer followed by urology with a recent negative PSA as well as a prior history of nephrolithiasis.  He is overdue for eye and dental care.  He is overdue for flu vaccine and COVID booster.  He is overdue for a colonoscopy.  He believes he has cerumen impaction, he has also been having a chronic cough, clearing throat frequently.   Past Medical/Surgical History: Past Medical History:  Diagnosis Date   Cancer [facility])    prostate   Dental crowns present    History of kidney stones    Plantar fasciitis of left foot 05/2013    Past Surgical History:  Procedure Laterality Date   BREAST ENHANCEMENT SURGERY Left    asymmetry of chest wall   CLOSED REDUCTION METACARPAL WITH PERCUTANEOUS PINNING Right 12/22/2004   5th metacarpal   CYSTOSCOPY WITH RETROGRADE PYELOGRAM, URETEROSCOPY AND STENT PLACEMENT Right 07/13/2020   Procedure: CYSTOSCOPY WITH RETROGRADE PYELOGRAM, RIGHT URETERAL STENT PLACEMENT;  Surgeon: Ceasar Mons, MD;  Location: [facility];  Service: Urology;  Laterality: Right;   CYSTOSCOPY/URETEROSCOPY/HOLMIUM LASER/STENT PLACEMENT Right 08/13/2020   Procedure: CYSTOSCOPY/URETEROSCOPY/RETROGRADE/ REMOVAL OF STONE/STENT EXCHANGE;  Surgeon: Raynelle Bring, MD;  Location: [facility];  Service: Urology;  Laterality: Right;   EXTRACORPOREAL SHOCK WAVE LITHOTRIPSY Right 07/09/2020   Procedure: EXTRACORPOREAL SHOCK WAVE LITHOTRIPSY  (ESWL);  Surgeon: Raynelle Bring, MD;  Location: [facility];  Service: Urology;  Laterality: Right;   KNEE ARTHROSCOPY Left    LITHOTRIPSY     LYMPHADENECTOMY Bilateral 02/21/2019   Procedure: LYMPHADENECTOMY, PELVIC;  Surgeon: Raynelle Bring, MD;  Location: [facility];  Service: Urology;  Laterality: Bilateral;   MANDIBLE SURGERY     PLANTAR FASCIA RELEASE  06/28/2012   Procedure: ENDOSCOPIC PLANTAR FASCIOTOMY;  Surgeon: Ninetta Lights, MD;  Location: [place];  Service: Orthopedics;  Laterality: Right;   PLANTAR FASCIA RELEASE Left 06/13/2013   Procedure: ENDOSCOPIC PLANTAR FASCIOTOMY;  Surgeon: Ninetta Lights, MD;  Location: [place];  Service: Orthopedics;  Laterality: Left;   ROBOT ASSISTED LAPAROSCOPIC RADICAL PROSTATECTOMY N/A 02/21/2019   Procedure: XI ROBOTIC ASSISTED LAPAROSCOPIC RADICAL PROSTATECTOMY LEVEL 2;  Surgeon: Raynelle Bring, MD;  Location: [facility];  Service: Urology;  Laterality: N/A;    Social History:  reports that he has never smoked. He has never used smokeless tobacco. He reports that he does not drink alcohol and does not use drugs.  Allergies: Allergies  Allergen Reactions   Oxycodone Nausea And Vomiting   Hydrocodone Nausea And Vomiting    Family History:  No history of heart disease, cancer, stroke that he is aware of  No current outpatient medications on file.  Review of Systems:  Constitutional: Denies fever, chills, diaphoresis, appetite change and fatigue.  HEENT: Denies photophobia, eye pain, redness, ear pain, congestion, sore throat, rhinorrhea, sneezing, mouth sores, trouble swallowing, neck pain, neck stiffness and tinnitus.   Respiratory: Denies SOB, DOE,  chest tightness,  and wheezing.  Cardiovascular: Denies chest pain, palpitations and leg swelling.  Gastrointestinal: Denies nausea, vomiting, abdominal pain, diarrhea, constipation, blood in stool and abdominal distention.  Genitourinary: Denies  dysuria, urgency, frequency, hematuria, flank pain and difficulty urinating.  Endocrine: Denies: hot or cold intolerance, sweats, changes in hair or nails, polyuria, polydipsia. Musculoskeletal: Denies myalgias, back pain, joint swelling, arthralgias and gait problem.  Skin: Denies pallor, rash and wound.  Neurological: Denies dizziness, seizures, syncope, weakness, light-headedness, numbness and headaches.  Hematological: Denies adenopathy. Easy bruising, personal or family bleeding history  Psychiatric/Behavioral: Denies suicidal ideation, mood changes, confusion, nervousness, sleep disturbance and agitation    Physical Exam: Vitals:   07/13/21 0930  BP: 110/80  Pulse: 82  Temp: 97.8 F (36.6 C)  TempSrc: Oral  SpO2: 96%  Weight: 200 lb 14.4 oz (91.1 kg)    Body mass index is 28.02 kg/m.   Constitutional: NAD, calm, comfortable Eyes: PERRL, lids and conjunctivae normal ENMT: Mucous membranes are moist. Posterior pharynx is erythematous but clear of any exudate or lesions. Normal dentition. Tympanic membrane is obstructed by cerumen bilaterally Neck: normal, supple, no masses, no thyromegaly Respiratory: clear to auscultation bilaterally, no wheezing, no crackles. Normal respiratory effort. No accessory muscle use.  Cardiovascular: Regular rate and rhythm, no murmurs / rubs / gallops. No extremity edema. 2+ pedal pulses. No carotid bruits.  Abdomen: no tenderness, no masses palpated. No hepatosplenomegaly. Bowel sounds positive.  Musculoskeletal: no clubbing / cyanosis. No joint deformity upper and lower extremities. Good ROM, no contractures. Normal muscle tone.  Skin: no rashes, lesions, ulcers. No induration Neurologic: CN 2-12 grossly intact. Sensation intact, DTR normal. Strength 5/5 in all 4.  Psychiatric: Normal judgment and insight. Alert and oriented x 3. Normal mood.    Impression and Plan:  Encounter for preventive health examination -Recommend routine eye and  dental care. -Immunizations: Flu vaccine in office today, he does not believe he will receive his COVID booster, otherwise immunizations are up-to-date -Healthy lifestyle discussed in detail. -Labs to be updated today. -Colon cancer screening: GI referral placed today, last colonoscopy in May 2012 -Breast cancer screening: Not applicable -Cervical cancer screening: Not applicable -Lung cancer screening: Not applicable -Prostate cancer screening: History of prostate cancer followed by urology -DEXA: Not applicable  Screening for malignant neoplasm of colon  - Plan: Ambulatory referral to Gastroenterology  Vitamin D deficiency  - Plan: VITAMIN D 25 Hydroxy (Vit-D Deficiency, Fractures)  Prostate cancer ([place])  -Followed by urology with a PSA of 0.015 in July.  Bilateral hearing loss due to cerumen impaction -Cerumen Desimpaction  After patient consent was obtained, warm water was applied and gentle ear lavage performed on bilateral ears. There were no complications and following the desimpaction the tympanic membranes were visible. Tympanic membranes are intact following the procedure. Auditory canals are normal. The patient reported relief of symptoms after removal of cerumen.  Chronic cough -Suspect related to postnasal drip and have advised a daily antihistamine.  Need for influenza vaccination -Flu vaccine administered today.  Aortic atherosclerosis (HCC) -Check lipids, blood pressures well controlled    Patient Instructions  -Nice seeing you today!!  -Lab work today; will notify you once results are available.  -Flu vaccine today.  -Schedule follow up in 6 months.      Lelon Frohlich, MD [facility]

## 2021-08-07 IMAGING — CT CT RENAL STONE PROTOCOL
2 of 4 series · 16 of 46 positions shown, 18 images · non-contrast
Comparison: 07/02/2020

CLINICAL DATA: Abdominal pain and vomiting status post lithotripsy
today.

EXAM:
CT ABDOMEN AND PELVIS WITHOUT CONTRAST
TECHNIQUE: Multidetector CT imaging of the abdomen and pelvis was performed
following the standard protocol without IV contrast.

[Series 2: axial st · axial · 0.83mm/px · z∈[+450,+895]mm · 13 of 99 slices shown, 15 images]
[im 5/99  soft-tissue]
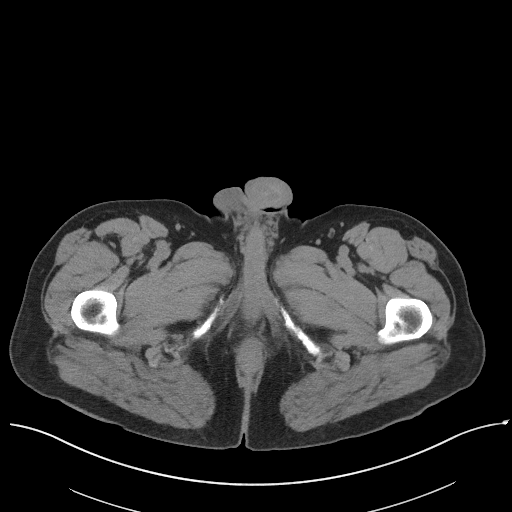
[im 5/99  bone]
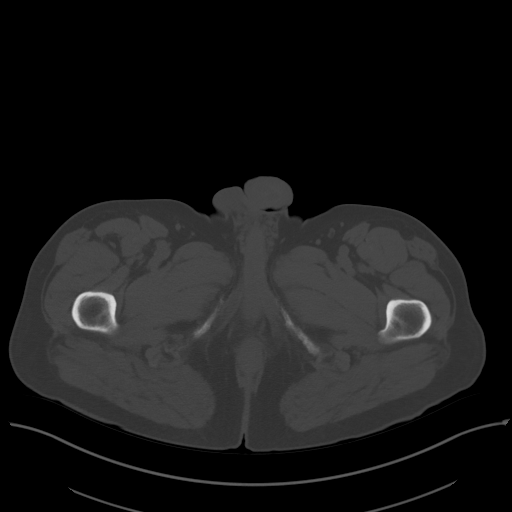
[im 14/99  soft-tissue]
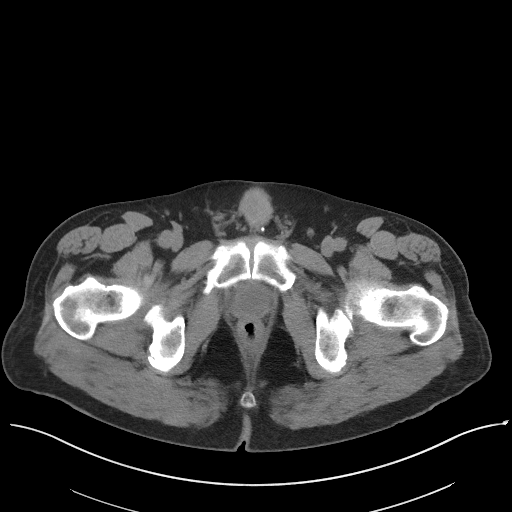
[im 23/99  soft-tissue]
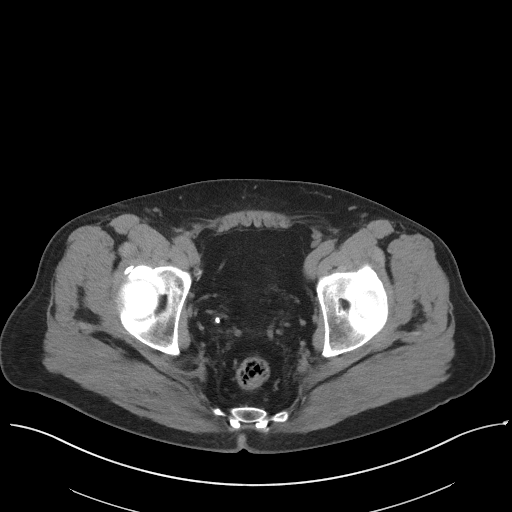
[im 27/99  soft-tissue]
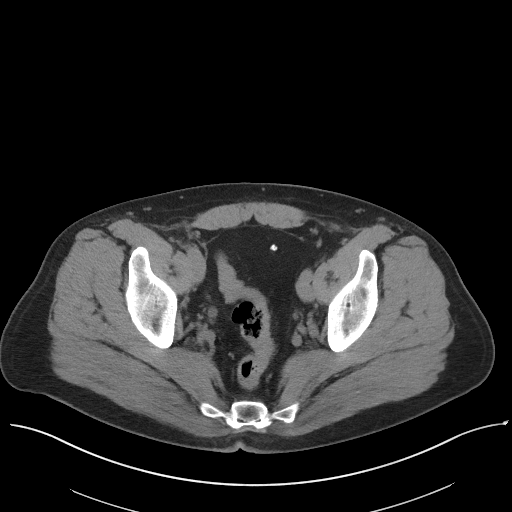
[im 36/99  soft-tissue]
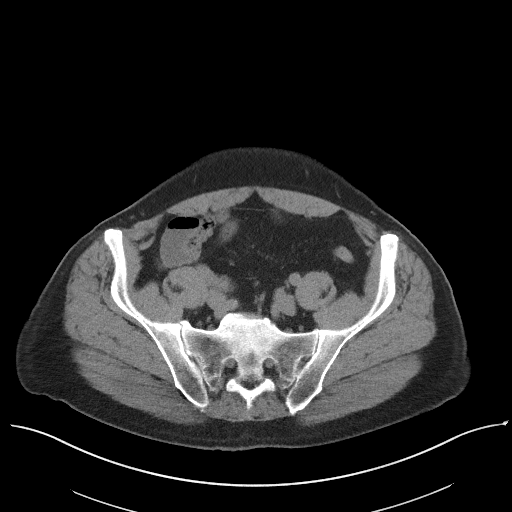
[im 41/99  soft-tissue]
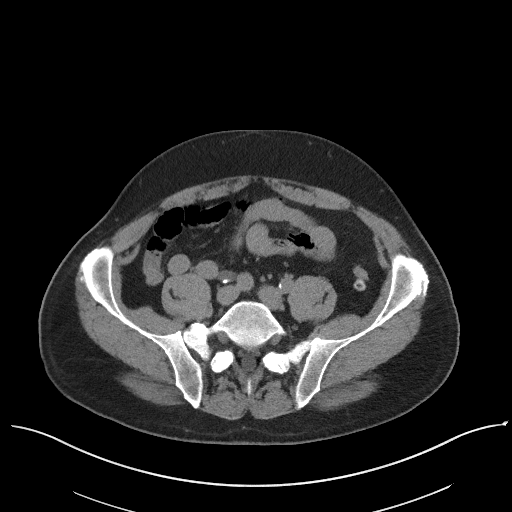
[im 49/99  soft-tissue]
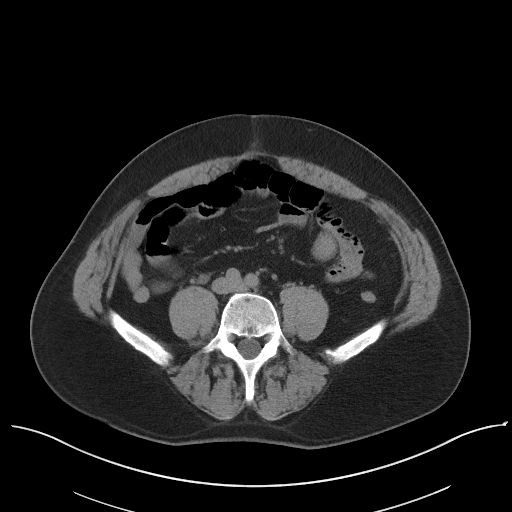
[im 58/99  soft-tissue]
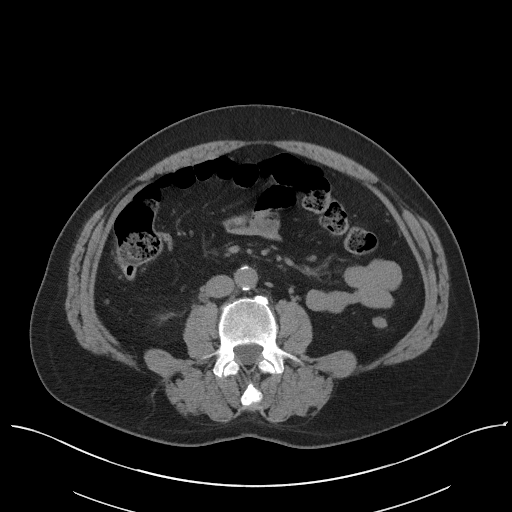
[im 63/99  soft-tissue]
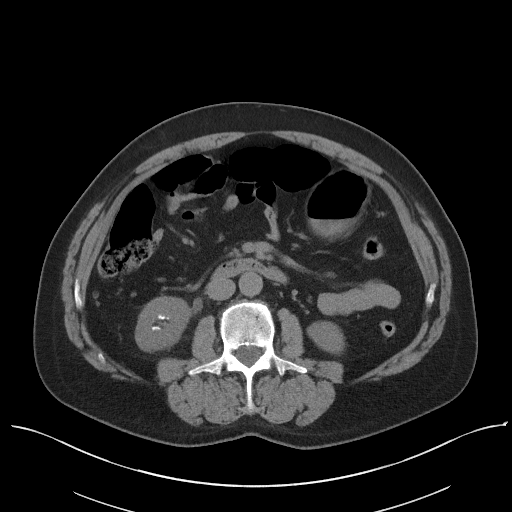
[im 63/99  bone]
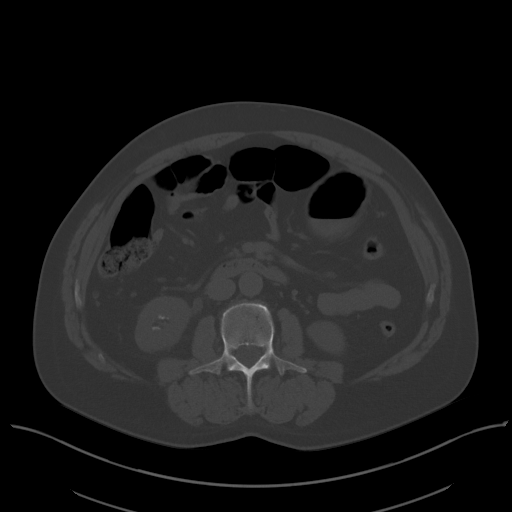
[im 72/99  soft-tissue]
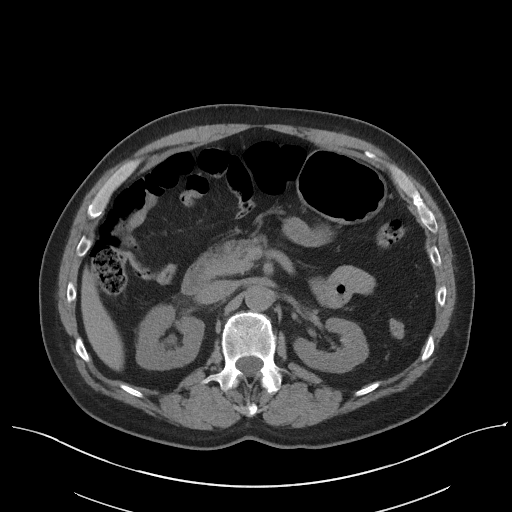
[im 76/99  soft-tissue]
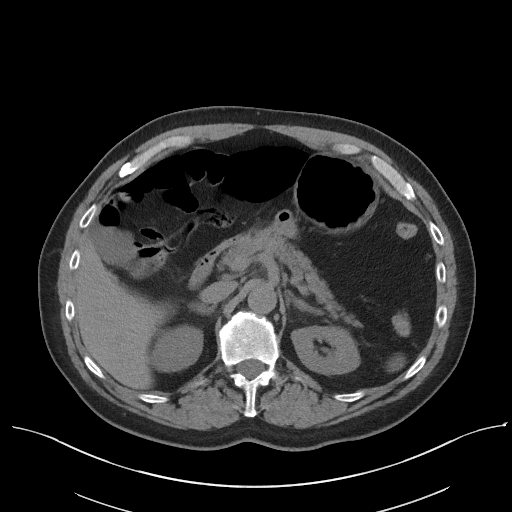
[im 85/99  soft-tissue]
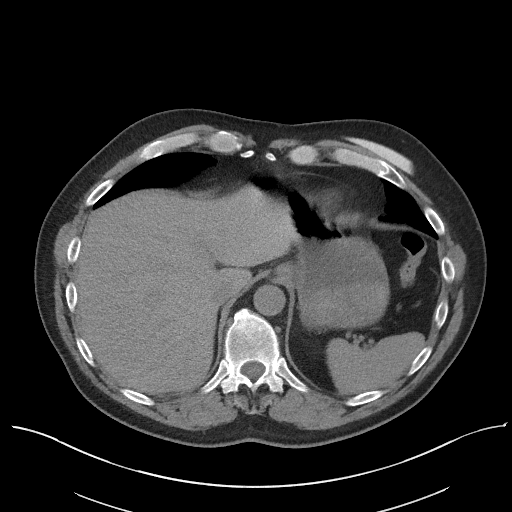
[im 94/99  soft-tissue]
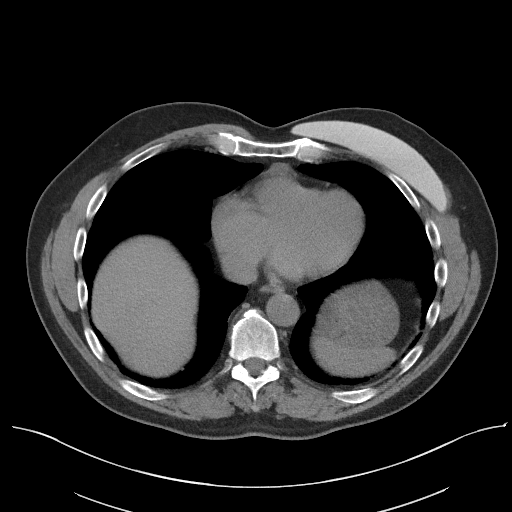

[Series 5: coronal st · coronal · 0.83mm/px · 3 of 92 slices shown]
[im 31/92  soft-tissue]
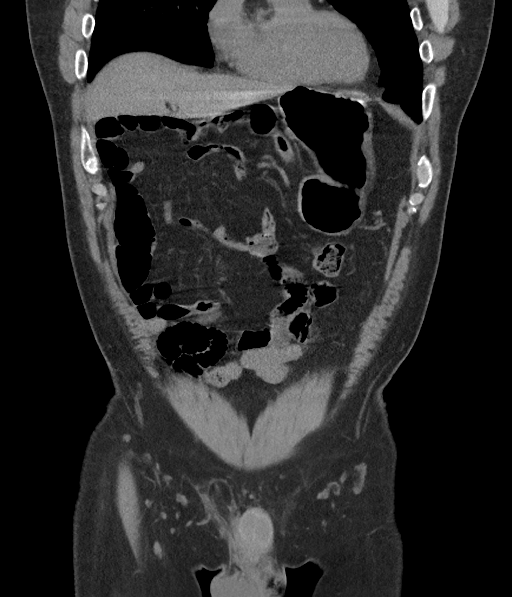
[im 41/92  soft-tissue]
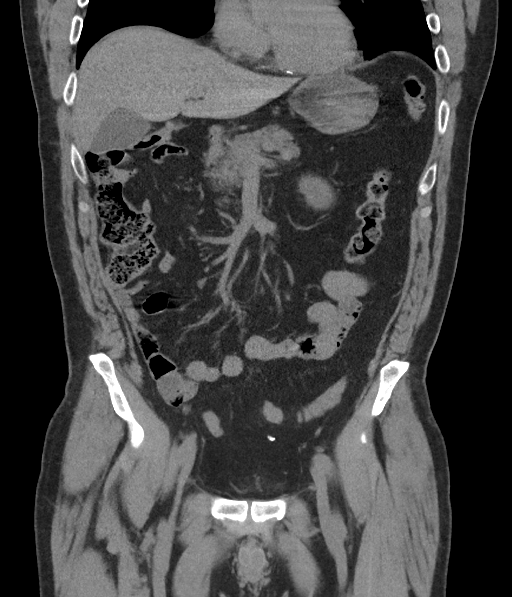
[im 51/92  soft-tissue]
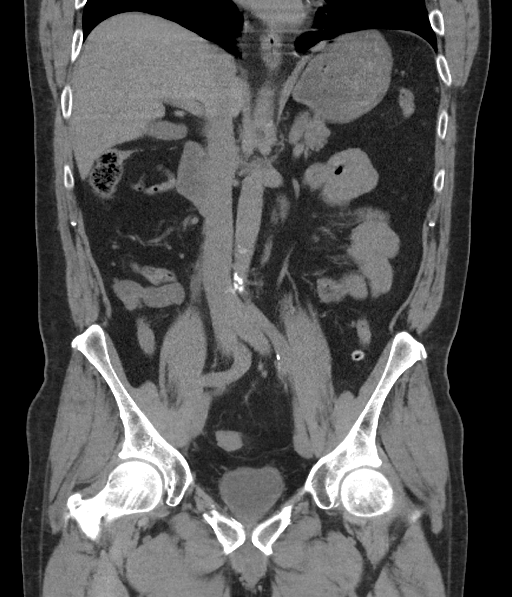

[16 of 46 positions shown; findings below may reference images not displayed]

FINDINGS: Lower chest: The lung bases are clear. The heart size is normal.

Hepatobiliary: The liver is normal. Normal gallbladder.There is no
biliary ductal dilation.

Pancreas: Normal contours without ductal dilatation. No
peripancreatic fluid collection.

Spleen: Unremarkable.

Adrenals/Urinary Tract:

--Adrenal glands: Unremarkable.

--Right kidney/ureter: There is worsening right-sided collecting
system dilatation. Multiple stones are noted in the lower pole the
right kidney measuring up to approximately 8 mm. There are few
additional punctate stones in the mid and upper pole. There is a 1
mm stone in the right UPJ. There is an obstructing 6 mm stone in the
distal right ureter (axial series 2, image 60). There is an
additional obstructing 5 mm stone in the distal right ureter nearly
to the bladder (axial series 2, image 77).

--Left kidney/ureter: There is nonobstructing left-sided
nephrolithiasis.

--Urinary bladder: Unremarkable.

Stomach/Bowel:

--Stomach/Duodenum: No hiatal hernia or other gastric abnormality.
Normal duodenal course and caliber.

--Small bowel: Unremarkable.

--Colon: Unremarkable.

--Appendix: Normal.

Vascular/Lymphatic: Atherosclerotic calcification is present within
the non-aneurysmal abdominal aorta, without hemodynamically
significant stenosis.

--No retroperitoneal lymphadenopathy.

--No mesenteric lymphadenopathy.

--No pelvic or inguinal lymphadenopathy.

Reproductive: Patient appears to be status post prostatectomy.

Other: No ascites or free air. The abdominal wall is normal.

Musculoskeletal. No acute displaced fractures.
IMPRESSION: 1. Interval development of mild to moderate right-sided
hydronephrosis secondary to two obstructing stones in the distal
right ureter as detailed above.
2. Bilateral nephrolithiasis.
3. Status post prostatectomy.

Aortic Atherosclerosis (2MWNV-HMA.A).

## 2021-09-09 ENCOUNTER — Ambulatory Visit (AMBULATORY_SURGERY_CENTER): Payer: Federal, State, Local not specified - PPO | Admitting: *Deleted

## 2021-09-09 ENCOUNTER — Other Ambulatory Visit: Payer: Self-pay

## 2021-09-09 VITALS — Ht 71.0 in | Wt 200.0 lb

## 2021-09-09 DIAGNOSIS — Z1211 Encounter for screening for malignant neoplasm of colon: Secondary | ICD-10-CM

## 2021-09-09 NOTE — Progress Notes (Signed)
No egg or soy allergy known to patient  No issues known to pt with past sedation with any surgeries or procedures Patient denies ever being told they had issues or difficulty with intubation  No FH of Malignant Hyperthermia Pt is not on diet pills Pt is not on  home 02  Pt is not on blood thinners  Pt denies issues with constipation  No A fib or A flutter  Pt is fully vaccinated  for Covid   Clenpiq sample to pt-  LOT [id number]  EXP 09/2021 Pt to pick up prep sample and prep instructions 2nd floor today   Due to the COVID-19 pandemic we are asking patients to follow certain guidelines in PV and the [facility]   Pt aware of COVID protocols and [facility] guidelines   PV completed over the phone. Pt verified name, DOB, address and insurance during PV today.  Pt mailed instruction packet with copy of consent form to read and not return, and instructions.  Clenpiq coupon mailed in packet.  Pt encouraged to call with questions or issues.  If pt has My chart, procedure instructions sent via My Chart

## 2021-09-15 ENCOUNTER — Encounter: Payer: Self-pay | Admitting: Certified Registered Nurse Anesthetist

## 2021-09-16 ENCOUNTER — Other Ambulatory Visit: Payer: Self-pay

## 2021-09-16 ENCOUNTER — Encounter: Payer: Self-pay | Admitting: Gastroenterology

## 2021-09-16 ENCOUNTER — Ambulatory Visit (AMBULATORY_SURGERY_CENTER): Payer: Federal, State, Local not specified - PPO | Admitting: Gastroenterology

## 2021-09-16 VITALS — BP 109/85 | HR 61 | Temp 97.5°F | Resp 16 | Ht 71.0 in | Wt 200.0 lb

## 2021-09-16 DIAGNOSIS — D120 Benign neoplasm of cecum: Secondary | ICD-10-CM

## 2021-09-16 DIAGNOSIS — K5730 Diverticulosis of large intestine without perforation or abscess without bleeding: Secondary | ICD-10-CM

## 2021-09-16 DIAGNOSIS — K640 First degree hemorrhoids: Secondary | ICD-10-CM

## 2021-09-16 DIAGNOSIS — D123 Benign neoplasm of transverse colon: Secondary | ICD-10-CM

## 2021-09-16 DIAGNOSIS — D125 Benign neoplasm of sigmoid colon: Secondary | ICD-10-CM

## 2021-09-16 DIAGNOSIS — Z1211 Encounter for screening for malignant neoplasm of colon: Secondary | ICD-10-CM

## 2021-09-16 DIAGNOSIS — K6389 Other specified diseases of intestine: Secondary | ICD-10-CM | POA: Diagnosis not present

## 2021-09-16 MED ORDER — SODIUM CHLORIDE 0.9 % IV SOLN: 500.0000 mL | INTRAVENOUS | Status: DC

## 2021-09-16 NOTE — Progress Notes (Signed)
Called to room to assist during endoscopic procedure.  Patient ID and intended procedure confirmed with present staff. Received instructions for my participation in the procedure from the performing physician.  

## 2021-09-16 NOTE — Patient Instructions (Signed)
Resume previous medications.  3 polyps removed and sent to pathology.  Diverticulosis and hemorrhoids.  Await results for final recommendations.  Handouts on findings given to patient (polyps, diverticulosis, hemorrhoids).     YOU HAD AN ENDOSCOPIC PROCEDURE TODAY AT [facility]:   Refer to the procedure report that was given to you for any specific questions about what was found during the examination.  If the procedure report does not answer your questions, please call your gastroenterologist to clarify.  If you requested that your care partner not be given the details of your procedure findings, then the procedure report has been included in a sealed envelope for you to review at your convenience later.  YOU SHOULD EXPECT: Some feelings of bloating in the abdomen. Passage of more gas than usual.  Walking can help get rid of the air that was put into your GI tract during the procedure and reduce the bloating. If you had a lower endoscopy (such as a colonoscopy or flexible sigmoidoscopy) you may notice spotting of blood in your stool or on the toilet paper. If you underwent a bowel prep for your procedure, you may not have a normal bowel movement for a few days.  Please Note:  You might notice some irritation and congestion in your nose or some drainage.  This is from the oxygen used during your procedure.  There is no need for concern and it should clear up in a day or so.  SYMPTOMS TO REPORT IMMEDIATELY:  Following lower endoscopy (colonoscopy or flexible sigmoidoscopy):  Excessive amounts of blood in the stool  Significant tenderness or worsening of abdominal pains  Swelling of the abdomen that is new, acute  Fever of 100F or higher  For urgent or emergent issues, a gastroenterologist can be reached at any hour by calling [phone]. Do not use MyChart messaging for urgent concerns.    DIET:  We do recommend a small meal at first, but then you may proceed to your regular  diet.  Drink plenty of fluids but you should avoid alcoholic beverages for 24 hours.  ACTIVITY:  You should plan to take it easy for the rest of today and you should NOT DRIVE or use heavy machinery until tomorrow (because of the sedation medicines used during the test).    FOLLOW UP: Our staff will call the number listed on your records 48-72 hours following your procedure to check on you and address any questions or concerns that you may have regarding the information given to you following your procedure. If we do not reach you, we will leave a message.  We will attempt to reach you two times.  During this call, we will ask if you have developed any symptoms of COVID 19. If you develop any symptoms (ie: fever, flu-like symptoms, shortness of breath, cough etc.) before then, please call [phone].  If you test positive for Covid 19 in the 2 weeks post procedure, please call and report this information to [place].    If any biopsies were taken you will be contacted by phone or by letter within the next 1-3 weeks.  Please call us at [phone] if you have not heard about the biopsies in 3 weeks.    SIGNATURES/CONFIDENTIALITY: You and/or your care partner have signed paperwork which will be entered into your electronic medical record.  These signatures attest to the fact that that the information above on your After Visit Summary has been reviewed and is understood.  Full responsibility of the confidentiality of this discharge information lies with you and/or your care-partner.

## 2021-09-16 NOTE — Op Note (Signed)
[facility] Patient Name: Tobias Avitabile Procedure Date: 09/16/2021 7:22 AM MRN: [id number] Endoscopist: Gerrit Heck , MD Age: 61 Referring MD:  Date of Birth: 1961/06/19 Gender: Male Account #: [id number] Procedure:                Colonoscopy Indications:              Screening for colorectal malignant neoplasm (last                            colonoscopy was 10 years ago). Otherwise no active                            GI symptoms and no known FHx of colon cancer. Medicines:                Monitored Anesthesia Care Procedure:                Pre-Anesthesia Assessment:                           - Prior to the procedure, a History and Physical                            was performed, and patient medications and                            allergies were reviewed. The patient's tolerance of                            previous anesthesia was also reviewed. The risks                            and benefits of the procedure and the sedation                            options and risks were discussed with the patient.                            All questions were answered, and informed consent                            was obtained. Prior Anticoagulants: The patient has                            taken no previous anticoagulant or antiplatelet                            agents. ASA Grade Assessment: II - A patient with                            mild systemic disease. After reviewing the risks                            and benefits, the patient was deemed in  satisfactory condition to undergo the procedure.                           After obtaining informed consent, the colonoscope                            was passed under direct vision. Throughout the                            procedure, the patient's blood pressure, pulse, and                            oxygen saturations were monitored continuously. The                            Olympus CF-HQ190L  [id number]) Colonoscope was                            introduced through the anus and advanced to the the                            terminal ileum. The colonoscopy was performed                            without difficulty. The patient tolerated the                            procedure well. The quality of the bowel                            preparation was excellent. The terminal ileum,                            ileocecal valve, appendiceal orifice, and rectum                            were photographed. Scope In: 8:06:56 AM Scope Out: 8:20:17 AM Scope Withdrawal Time: 0 hours 10 minutes 59 seconds  Total Procedure Duration: 0 hours 13 minutes 21 seconds  Findings:                 The perianal and digital rectal examinations were                            normal.                           Two sessile polyps were found in the transverse                            colon and cecum. The polyps were 2 to 4 mm in size.                            These polyps were removed with a cold snare.  Resection and retrieval were complete. Estimated                            blood loss was minimal.                           A 6 mm polyp was found in the sigmoid colon. The                            polyp was sessile. The polyp was removed with a                            cold snare. Resection and retrieval were complete.                            Estimated blood loss was minimal.                           Multiple small-mouthed diverticula were found in                            the sigmoid colon.                           Non-bleeding internal hemorrhoids were found during                            retroflexion. The hemorrhoids were small.                           The terminal ileum appeared normal. Complications:            No immediate complications. Estimated Blood Loss:     Estimated blood loss was minimal. Impression:               - Two 2 to 4 mm polyps in  the transverse colon and                            in the cecum, removed with a cold snare. Resected                            and retrieved.                           - One 6 mm polyp in the sigmoid colon, removed with                            a cold snare. Resected and retrieved.                           - Diverticulosis in the sigmoid colon.                           - Non-bleeding internal hemorrhoids.                           -  The examined portion of the ileum was normal. Recommendation:           - Patient has a contact number available for                            emergencies. The signs and symptoms of potential                            delayed complications were discussed with the                            patient. Return to normal activities tomorrow.                            Written discharge instructions were provided to the                            patient.                           - Resume previous diet.                           - Continue present medications.                           - Await pathology results.                           - Repeat colonoscopy for surveillance based on                            pathology results.                           - Return to GI office PRN. Gerrit Heck, MD 09/16/2021 8:26:04 AM

## 2021-09-16 NOTE — Progress Notes (Signed)
Report given to PACU, vss 

## 2021-09-16 NOTE — Progress Notes (Signed)
Pt's states no medical or surgical changes since previsit or office visit. 

## 2021-09-16 NOTE — Progress Notes (Signed)
GASTROENTEROLOGY PROCEDURE H&P NOTE   Primary Care Physician: Isaac Bliss, Rayford Halsted, MD    Reason for Procedure:  Colon Cancer screening  Plan:    Colonoscopy  Patient is appropriate for endoscopic procedure(s) in the ambulatory ([facility]) setting.  The nature of the procedure, as well as the risks, benefits, and alternatives were carefully and thoroughly reviewed with the patient. Ample time for discussion and questions allowed. The patient understood, was satisfied, and agreed to proceed.     HPI: Adrian [name] is a 61 y.o. male who presents for colonoscopy for routine Colon Cancer screening.  No active GI symptoms.  No known family history of colon cancer or related malignancy.  Patient is otherwise without complaints or active issues today.  Colonoscopy in 01/2011 with mild sigmoid diverticulosis, otherwise normal with recommendation repeat in 10 years.  Past Medical History:  Diagnosis Date   Cancer [facility])    prostate   Dental crowns present    History of kidney stones    Plantar fasciitis of left and right  foot 05/2013    Past Surgical History:  Procedure Laterality Date   BREAST ENHANCEMENT SURGERY Left    asymmetry of chest wall   CLOSED REDUCTION METACARPAL WITH PERCUTANEOUS PINNING Right 12/22/2004   5th metacarpal   COLONOSCOPY  2012   CYSTOSCOPY WITH RETROGRADE PYELOGRAM, URETEROSCOPY AND STENT PLACEMENT Right 07/13/2020   Procedure: CYSTOSCOPY WITH RETROGRADE PYELOGRAM, RIGHT URETERAL STENT PLACEMENT;  Surgeon: Ceasar Mons, MD;  Location: [facility];  Service: Urology;  Laterality: Right;   CYSTOSCOPY/URETEROSCOPY/HOLMIUM LASER/STENT PLACEMENT Right 08/13/2020   Procedure: CYSTOSCOPY/URETEROSCOPY/RETROGRADE/ REMOVAL OF STONE/STENT EXCHANGE;  Surgeon: Raynelle Bring, MD;  Location: [facility];  Service: Urology;  Laterality: Right;   EXTRACORPOREAL SHOCK WAVE LITHOTRIPSY Right 07/09/2020   Procedure: EXTRACORPOREAL SHOCK WAVE LITHOTRIPSY (ESWL);   Surgeon: Raynelle Bring, MD;  Location: [facility];  Service: Urology;  Laterality: Right;   KNEE ARTHROSCOPY Left    LITHOTRIPSY     LYMPHADENECTOMY Bilateral 02/21/2019   Procedure: LYMPHADENECTOMY, PELVIC;  Surgeon: Raynelle Bring, MD;  Location: [facility];  Service: Urology;  Laterality: Bilateral;   MANDIBLE SURGERY     PLANTAR FASCIA RELEASE  06/28/2012   Procedure: ENDOSCOPIC PLANTAR FASCIOTOMY;  Surgeon: Ninetta Lights, MD;  Location: [place];  Service: Orthopedics;  Laterality: Right;   PLANTAR FASCIA RELEASE Left 06/13/2013   Procedure: ENDOSCOPIC PLANTAR FASCIOTOMY;  Surgeon: Ninetta Lights, MD;  Location: [place];  Service: Orthopedics;  Laterality: Left;   ROBOT ASSISTED LAPAROSCOPIC RADICAL PROSTATECTOMY N/A 02/21/2019   Procedure: XI ROBOTIC ASSISTED LAPAROSCOPIC RADICAL PROSTATECTOMY LEVEL 2;  Surgeon: Raynelle Bring, MD;  Location: [facility];  Service: Urology;  Laterality: N/A;    Prior to Admission medications   Medication Sig Start Date End Date Taking? Authorizing Provider  Ascorbic Acid (VITAMIN C) 1000 MG tablet Take 1,000 mg by mouth daily.   Yes [provider]  cholecalciferol (VITAMIN D3) 25 MCG (1000 UNIT) tablet Take 1,000 Units by mouth daily.   Yes [provider]  zinc gluconate 50 MG tablet Take 50 mg by mouth daily.   Yes [provider]  tadalafil (CIALIS) 20 MG tablet Take 20 mg by mouth as directed. 07/29/21   [provider]    Current Outpatient Medications  Medication Sig Dispense Refill   Ascorbic Acid (VITAMIN C) 1000 MG tablet Take 1,000 mg by mouth daily.     cholecalciferol (VITAMIN D3) 25 MCG (1000  UNIT) tablet Take 1,000 Units by mouth daily.     zinc gluconate 50 MG tablet Take 50 mg by mouth daily.     tadalafil (CIALIS) 20 MG tablet Take 20 mg by mouth as directed.     Current Facility-Administered Medications  Medication Dose Route Frequency Provider  Last Rate Last Admin   0.9 %  sodium chloride infusion  500 mL Intravenous Continuous Krishna Heuer V, DO        Allergies as of 09/16/2021 - Review Complete 09/16/2021  Allergen Reaction Noted   Oxycodone Nausea And Vomiting 06/22/2012   Hydrocodone Nausea And Vomiting 07/13/2013    Family History  Problem Relation Age of Onset   Colon cancer Neg Hx    Colon polyps Neg Hx    Esophageal cancer Neg Hx    Rectal cancer Neg Hx    Stomach cancer Neg Hx     Social History   Socioeconomic History   Marital status: Married    Spouse name: Not on file   Number of children: Not on file   Years of education: Not on file   Highest education level: Not on file  Occupational History   Not on file  Tobacco Use   Smoking status: Never   Smokeless tobacco: Never  Vaping Use   Vaping Use: Never used  Substance and Sexual Activity   Alcohol use: No   Drug use: No   Sexual activity: Not on file  Other Topics Concern   Not on file  Social History Narrative   Not on file   Social Determinants of Health   Financial Resource Strain: Not on file  Food Insecurity: Not on file  Transportation Needs: Not on file  Physical Activity: Not on file  Stress: Not on file  Social Connections: Not on file  Intimate Partner Violence: Not on file    Physical Exam: Vital signs in last 24 hours: @BP  (!) 145/72    Pulse 81    Temp (!) 97.5 F (36.4 C) (Temporal)    Ht 5\' 11"  (1.803 m)    Wt 200 lb (90.7 kg)    SpO2 97%    BMI 27.89 kg/m  GEN: NAD EYE: Sclerae anicteric ENT: MMM CV: Non-tachycardic Pulm: CTA b/l GI: Soft, NT/ND NEURO:  Alert & Oriented x [place], DO [facility]   09/16/2021 7:47 AM

## 2021-09-20 ENCOUNTER — Telehealth: Payer: Self-pay

## 2021-09-20 NOTE — Telephone Encounter (Signed)
°  Follow up Call-  Call back number 09/16/2021  Post procedure Call Back phone  # [phone]  Permission to leave phone message Yes  Some recent data might be hidden     Patient questions:  Do you have a fever, pain , or abdominal swelling? No. Pain Score  0 *  Have you tolerated food without any problems? Yes.    Have you been able to return to your normal activities? Yes.    Do you have any questions about your discharge instructions: Diet   No. Medications  No. Follow up visit  No.  Do you have questions or concerns about your Care? No.  Actions: * If pain score is 4 or above: No action needed, pain <4.  Have you developed a fever since your procedure? no  2.   Have you had an respiratory symptoms (SOB or cough) since your procedure? no  3.   Have you tested positive for COVID 19 since your procedure no  4.   Have you had any family members/close contacts diagnosed with the COVID 19 since your procedure?  no   If yes to any of these questions please route to Joylene John, RN and Joella Prince, RN

## 2021-09-24 ENCOUNTER — Encounter: Payer: Self-pay | Admitting: Gastroenterology

## 2021-10-06 DIAGNOSIS — S91302A Unspecified open wound, left foot, initial encounter: Secondary | ICD-10-CM | POA: Diagnosis not present

## 2021-11-03 DIAGNOSIS — C61 Malignant neoplasm of prostate: Secondary | ICD-10-CM | POA: Diagnosis not present

## 2021-11-03 LAB — PSA: PSA: <0.015

## 2021-11-10 DIAGNOSIS — N5201 Erectile dysfunction due to arterial insufficiency: Secondary | ICD-10-CM | POA: Diagnosis not present

## 2021-11-10 DIAGNOSIS — C61 Malignant neoplasm of prostate: Secondary | ICD-10-CM | POA: Diagnosis not present

## 2021-11-10 DIAGNOSIS — N200 Calculus of kidney: Secondary | ICD-10-CM | POA: Diagnosis not present

## 2021-11-11 ENCOUNTER — Encounter: Payer: Self-pay | Admitting: Internal Medicine

## 2022-01-11 DIAGNOSIS — N50819 Testicular pain, unspecified: Secondary | ICD-10-CM | POA: Diagnosis not present

## 2022-01-18 DIAGNOSIS — R102 Pelvic and perineal pain: Secondary | ICD-10-CM | POA: Diagnosis not present

## 2022-01-18 DIAGNOSIS — R1084 Generalized abdominal pain: Secondary | ICD-10-CM | POA: Diagnosis not present

## 2022-01-18 DIAGNOSIS — N200 Calculus of kidney: Secondary | ICD-10-CM | POA: Diagnosis not present

## 2022-01-31 DIAGNOSIS — K769 Liver disease, unspecified: Secondary | ICD-10-CM | POA: Diagnosis not present

## 2022-01-31 DIAGNOSIS — K5730 Diverticulosis of large intestine without perforation or abscess without bleeding: Secondary | ICD-10-CM | POA: Diagnosis not present

## 2022-01-31 DIAGNOSIS — R1084 Generalized abdominal pain: Secondary | ICD-10-CM | POA: Diagnosis not present

## 2022-01-31 DIAGNOSIS — N200 Calculus of kidney: Secondary | ICD-10-CM | POA: Diagnosis not present

## 2022-01-31 DIAGNOSIS — R102 Pelvic and perineal pain: Secondary | ICD-10-CM | POA: Diagnosis not present

## 2022-01-31 DIAGNOSIS — Z8546 Personal history of malignant neoplasm of prostate: Secondary | ICD-10-CM | POA: Diagnosis not present

## 2022-02-01 ENCOUNTER — Encounter: Payer: Self-pay | Admitting: Internal Medicine

## 2022-02-24 DIAGNOSIS — C61 Malignant neoplasm of prostate: Secondary | ICD-10-CM | POA: Diagnosis not present

## 2022-02-24 LAB — PSA

## 2022-03-03 DIAGNOSIS — N200 Calculus of kidney: Secondary | ICD-10-CM | POA: Diagnosis not present

## 2022-03-03 DIAGNOSIS — N5201 Erectile dysfunction due to arterial insufficiency: Secondary | ICD-10-CM | POA: Diagnosis not present

## 2022-03-03 DIAGNOSIS — Z8546 Personal history of malignant neoplasm of prostate: Secondary | ICD-10-CM | POA: Diagnosis not present

## 2022-07-05 ENCOUNTER — Emergency Department (HOSPITAL_BASED_OUTPATIENT_CLINIC_OR_DEPARTMENT_OTHER): Payer: Federal, State, Local not specified - PPO

## 2022-07-05 ENCOUNTER — Other Ambulatory Visit: Payer: Self-pay

## 2022-07-05 ENCOUNTER — Encounter (HOSPITAL_BASED_OUTPATIENT_CLINIC_OR_DEPARTMENT_OTHER): Payer: Self-pay

## 2022-07-05 ENCOUNTER — Emergency Department (HOSPITAL_BASED_OUTPATIENT_CLINIC_OR_DEPARTMENT_OTHER)
Admission: EM | Admit: 2022-07-05 | Discharge: 2022-07-06 | Disposition: A | Discharge status: Home / Self Care | Payer: Federal, State, Local not specified - PPO | Attending: Emergency Medicine | Admitting: Emergency Medicine

## 2022-07-05 DIAGNOSIS — N200 Calculus of kidney: Secondary | ICD-10-CM | POA: Insufficient documentation

## 2022-07-05 DIAGNOSIS — K5730 Diverticulosis of large intestine without perforation or abscess without bleeding: Secondary | ICD-10-CM | POA: Diagnosis not present

## 2022-07-05 DIAGNOSIS — Z8546 Personal history of malignant neoplasm of prostate: Secondary | ICD-10-CM | POA: Diagnosis not present

## 2022-07-05 DIAGNOSIS — N132 Hydronephrosis with renal and ureteral calculous obstruction: Secondary | ICD-10-CM | POA: Diagnosis not present

## 2022-07-05 DIAGNOSIS — R109 Unspecified abdominal pain: Secondary | ICD-10-CM | POA: Diagnosis not present

## 2022-07-05 DIAGNOSIS — N134 Hydroureter: Secondary | ICD-10-CM | POA: Diagnosis not present

## 2022-07-05 LAB — URINALYSIS, ROUTINE W REFLEX MICROSCOPIC
Bilirubin Urine: NEGATIVE
Glucose, UA: NEGATIVE mg/dL
Ketones, ur: NEGATIVE mg/dL
Leukocytes,Ua: NEGATIVE
Nitrite: NEGATIVE
RBC / HPF: >50 RBC/hpf — ABNORMAL HIGH (ref 0–5)
Specific Gravity, Urine: 1.024 (ref 1.005–1.030)
pH: 6 (ref 5.0–8.0)

## 2022-07-05 LAB — BASIC METABOLIC PANEL
Anion gap: 9 (ref 5–15)
BUN: 25 mg/dL — ABNORMAL HIGH (ref 8–23)
CO2: 24 mmol/L (ref 22–32)
Calcium: 9.3 mg/dL (ref 8.9–10.3)
Chloride: 106 mmol/L (ref 98–111)
Creatinine, Ser: 1.45 mg/dL — ABNORMAL HIGH (ref 0.61–1.24)
GFR, Estimated: 54 mL/min — ABNORMAL LOW (ref >60)
Glucose, Bld: 148 mg/dL — ABNORMAL HIGH (ref 70–99)
Potassium: 4 mmol/L (ref 3.5–5.1)
Sodium: 139 mmol/L (ref 135–145)

## 2022-07-05 LAB — CBC
HCT: 46 % (ref 39.0–52.0)
Hemoglobin: 15.8 g/dL (ref 13.0–17.0)
MCH: 29.6 pg (ref 26.0–34.0)
MCHC: 34.3 g/dL (ref 30.0–36.0)
MCV: 86.1 fL (ref 80.0–100.0)
Platelets: 275 10*3/uL (ref 150–400)
RBC: 5.34 MIL/uL (ref 4.22–5.81)
RDW: 12.5 % (ref 11.5–15.5)
WBC: 8 10*3/uL (ref 4.0–10.5)
nRBC: 0 % (ref 0.0–0.2)

## 2022-07-05 NOTE — ED Triage Notes (Signed)
Pt arrives POV from home with c/o right flank pain that started around 1 hour ago.  Pt says he has had multiple kidney stones in the past.    Denies any problems passing urine.  Pt ambulatory to triage, in NAD.

## 2022-07-06 DIAGNOSIS — N200 Calculus of kidney: Secondary | ICD-10-CM | POA: Diagnosis not present

## 2022-07-06 MED ORDER — ONDANSETRON HCL 4 MG/2ML IJ SOLN
4.0000 mg | Freq: Once | INTRAMUSCULAR | Status: AC
  Administered 2022-07-06: 4 mg via INTRAVENOUS
  Filled 2022-07-06: qty 2

## 2022-07-06 MED ORDER — TAMSULOSIN HCL 0.4 MG PO CAPS: 0.4000 mg | ORAL_CAPSULE | Freq: Every day | ORAL | 0 refills | Status: DC

## 2022-07-06 MED ORDER — HYDROMORPHONE HCL 1 MG/ML IJ SOLN
1.0000 mg | Freq: Once | INTRAMUSCULAR | Status: AC
  Administered 2022-07-06: 1 mg via INTRAVENOUS
  Filled 2022-07-06: qty 1

## 2022-07-06 MED ORDER — KETOROLAC TROMETHAMINE 15 MG/ML IJ SOLN
15.0000 mg | Freq: Once | INTRAMUSCULAR | Status: AC
  Administered 2022-07-06: 15 mg via INTRAVENOUS
  Filled 2022-07-06: qty 1

## 2022-07-06 MED ORDER — SODIUM CHLORIDE 0.9 % IV BOLUS
1000.0000 mL | Freq: Once | INTRAVENOUS | Status: AC
  Administered 2022-07-06: 1000 mL via INTRAVENOUS

## 2022-07-06 MED ORDER — OXYCODONE HCL 5 MG PO TABS: 5.0000 mg | ORAL_TABLET | ORAL | 0 refills | Status: DC | PRN

## 2022-07-06 NOTE — Discharge Instructions (Signed)
You were evaluated in the Emergency Department and after careful evaluation, we did not find any emergent condition requiring admission or further testing in the hospital.  Your exam/testing today is overall reassuring.  Symptoms seem to be due to a kidney stone.  Use the Flomax daily to help pass the stone.  Recommend Tylenol 1000 mg every 4-6 hours and/or Motrin 600 mg every 4-6 hours for pain.  You can use the oxycodone medication for more significant pain.  Recommend follow-up with urology as we discussed.  Please return to the Emergency Department if you experience any worsening of your condition.   Thank you for allowing [place] to be a part of your care.

## 2022-07-06 NOTE — ED Provider Notes (Signed)
[facility] Emergency Department Provider Note MRN:  [id number]  Arrival date & time: 07/06/22     Chief Complaint   Flank Pain   History of Present Illness   Adrian [name] is a 61 y.o. year-old male with a history of prostate cancer presenting to the ED with chief complaint of flank pain.  Right flank pain for the past several hours.  Has a long history of kidney stones and so he is suspicious that he has another 1.  No nausea vomiting, no fever, no other complaints.  Some radiation to the right testicle on occasion.  Review of Systems  A thorough review of systems was obtained and all systems are negative except as noted in the HPI and PMH.   Patient's Health History    Past Medical History:  Diagnosis Date   Cancer [facility])    prostate   Dental crowns present    History of kidney stones    Plantar fasciitis of left and right  foot 05/2013    Past Surgical History:  Procedure Laterality Date   BREAST ENHANCEMENT SURGERY Left    asymmetry of chest wall   CLOSED REDUCTION METACARPAL WITH PERCUTANEOUS PINNING Right 12/22/2004   5th metacarpal   COLONOSCOPY  2012   CYSTOSCOPY WITH RETROGRADE PYELOGRAM, URETEROSCOPY AND STENT PLACEMENT Right 07/13/2020   Procedure: CYSTOSCOPY WITH RETROGRADE PYELOGRAM, RIGHT URETERAL STENT PLACEMENT;  Surgeon: Ceasar Mons, MD;  [facility];  Service: Urology;  Laterality: Right;   CYSTOSCOPY/URETEROSCOPY/HOLMIUM LASER/STENT PLACEMENT Right 08/13/2020   Procedure: CYSTOSCOPY/URETEROSCOPY/RETROGRADE/ REMOVAL OF STONE/STENT EXCHANGE;  Surgeon: Raynelle Bring, MD;  [facility];  Service: Urology;  Laterality: Right;   EXTRACORPOREAL SHOCK WAVE LITHOTRIPSY Right 07/09/2020   Procedure: EXTRACORPOREAL SHOCK WAVE LITHOTRIPSY (ESWL);  Surgeon: Raynelle Bring, MD;  Location: [facility];  Service: Urology;  Laterality: Right;   KNEE ARTHROSCOPY Left    LITHOTRIPSY     LYMPHADENECTOMY Bilateral  02/21/2019   Procedure: LYMPHADENECTOMY, PELVIC;  Surgeon: Raynelle Bring, MD;  [facility];  Service: Urology;  Laterality: Bilateral;   MANDIBLE SURGERY     PLANTAR FASCIA RELEASE  06/28/2012   Procedure: ENDOSCOPIC PLANTAR FASCIOTOMY;  Surgeon: Ninetta Lights, MD;  Location: [place];  Service: Orthopedics;  Laterality: Right;   PLANTAR FASCIA RELEASE Left 06/13/2013   Procedure: ENDOSCOPIC PLANTAR FASCIOTOMY;  Surgeon: Ninetta Lights, MD;  Location: [place];  Service: Orthopedics;  Laterality: Left;   ROBOT ASSISTED LAPAROSCOPIC RADICAL PROSTATECTOMY N/A 02/21/2019   Procedure: XI ROBOTIC ASSISTED LAPAROSCOPIC RADICAL PROSTATECTOMY LEVEL 2;  Surgeon: Raynelle Bring, MD;  [facility];  Service: Urology;  Laterality: N/A;    Family History  Problem Relation Age of Onset   Colon cancer Neg Hx    Colon polyps Neg Hx    Esophageal cancer Neg Hx    Rectal cancer Neg Hx    Stomach cancer Neg Hx     Social History   Socioeconomic History   Marital status: Married    Spouse name: Not on file   Number of children: Not on file   Years of education: Not on file   Highest education level: Not on file  Occupational History   Not on file  Tobacco Use   Smoking status: Never   Smokeless tobacco: Never  Vaping Use   Vaping Use: Never used  Substance and Sexual Activity   Alcohol use: No   Drug use: No   Sexual  activity: Not on file  Other Topics Concern   Not on file  Social History Narrative   Not on file   Social Determinants of Health   Financial Resource Strain: Not on file  Food Insecurity: Not on file  Transportation Needs: Not on file  Physical Activity: Not on file  Stress: Not on file  Social Connections: Unknown (02/18/2019)   Social Connection and Isolation Panel [NHANES]    Frequency of Communication with Friends and Family: Not asked    Frequency of Social Gatherings with Friends and Family: Not asked    Attends  Religious Services: Not asked    Active Member of [place] or Organizations: Not asked    Attends Archivist Meetings: Not asked    Marital Status: Not asked  Intimate Partner Violence: Not on file     Physical Exam   Vitals:   07/06/22 0030 07/06/22 0100  BP: 138/83 131/75  Pulse: 66 66  Resp:  16  Temp:    SpO2: 92% 92%    CONSTITUTIONAL: Well-appearing, NAD NEURO/PSYCH:  Alert and oriented x 3, no focal deficits EYES:  eyes equal and reactive ENT/NECK:  no LAD, no JVD CARDIO: Regular rate, well-perfused, normal S1 and S2 PULM:  CTAB no wheezing or rhonchi GI/GU:  non-distended, non-tender MSK/SPINE:  No gross deformities, no edema SKIN:  no rash, atraumatic   *Additional and/or pertinent findings included in MDM below  Diagnostic and Interventional Summary    EKG Interpretation  Date/Time:    Ventricular Rate:    PR Interval:    QRS Duration:   QT Interval:    QTC Calculation:   R Axis:     Text Interpretation:         Labs Reviewed  URINALYSIS, ROUTINE W REFLEX MICROSCOPIC - Abnormal; Notable for the following components:      Result Value   Hgb urine dipstick LARGE (*)    Protein, ur TRACE (*)    RBC / HPF >50 (*)    All other components within normal limits  BASIC METABOLIC PANEL - Abnormal; Notable for the following components:   Glucose, Bld 148 (*)    BUN 25 (*)    Creatinine, Ser 1.45 (*)    GFR, Estimated 54 (*)    All other components within normal limits  CBC    CT Renal Stone Study  Final Result      Medications  ondansetron (ZOFRAN) injection 4 mg (has no administration in time range)  ketorolac (TORADOL) 15 MG/ML injection 15 mg (15 mg Intravenous Given 07/06/22 0013)  HYDROmorphone (DILAUDID) injection 1 mg (1 mg Intravenous Given 07/06/22 0014)  ondansetron (ZOFRAN) injection 4 mg (4 mg Intravenous Given 07/06/22 0014)  sodium chloride 0.9 % bolus 1,000 mL (0 mLs Intravenous Stopped 07/06/22 0051)     Procedures  /   Critical Care Procedures  ED Course and Medical Decision Making  Initial Impression and Ddx Suspicious for kidney stone, other considerations include AAA, renal infarct, diverticulitis, appendicitis, awaiting labs and CT imaging.  Past medical/surgical history that increases complexity of ED encounter: Kidney stones  Interpretation of Diagnostics I personally reviewed the laboratory assessment and my interpretation is as follows: Mild elevation in creatinine compared to prior, otherwise no significant blood count or electrolyte disturbance.  Urinalysis with large RBCs  CT imaging confirms a 5 mm obstructing stone on the right.  Patient Reassessment and Ultimate Disposition/Management     Patient feeling a lot better, appropriate for discharge with  urology follow-up.  Patient management required discussion with the following services or consulting groups:  None  Complexity of Problems Addressed Acute illness or injury that poses threat of life of bodily function  Additional Data Reviewed and Analyzed Further history obtained from: Prior labs/imaging results  Additional Factors Impacting ED Encounter Risk Use of parenteral controlled substances  Adrian Kirks. Adrian [name], [place] [email or web address]  Final Clinical Impressions(s) / ED Diagnoses     ICD-10-CM   1. Kidney stone  N20.0       ED Discharge Orders          Ordered    oxyCODONE (ROXICODONE) 5 MG immediate release tablet  Every 4 hours PRN        07/06/22 0147    tamsulosin (FLOMAX) 0.4 MG CAPS capsule  Daily        07/06/22 0147             Discharge Instructions Discussed with and Provided to Patient:     Discharge Instructions      You were evaluated in the Emergency Department and after careful evaluation, we did not find any emergent condition requiring admission or further testing in the hospital.  Your exam/testing today is overall reassuring.   Symptoms seem to be due to a kidney stone.  Use the Flomax daily to help pass the stone.  Recommend Tylenol 1000 mg every 4-6 hours and/or Motrin 600 mg every 4-6 hours for pain.  You can use the oxycodone medication for more significant pain.  Recommend follow-up with urology as we discussed.  Please return to the Emergency Department if you experience any worsening of your condition.   Thank you for allowing [place] to be a part of your care.       Maudie Flakes, MD 07/06/22 [phone]

## 2022-07-14 DIAGNOSIS — N202 Calculus of kidney with calculus of ureter: Secondary | ICD-10-CM | POA: Diagnosis not present

## 2022-07-20 ENCOUNTER — Other Ambulatory Visit: Payer: Self-pay | Admitting: Urology

## 2022-07-20 DIAGNOSIS — R310 Gross hematuria: Secondary | ICD-10-CM | POA: Diagnosis not present

## 2022-07-20 DIAGNOSIS — N202 Calculus of kidney with calculus of ureter: Secondary | ICD-10-CM | POA: Diagnosis not present

## 2022-07-20 DIAGNOSIS — N200 Calculus of kidney: Secondary | ICD-10-CM | POA: Diagnosis not present

## 2022-07-20 DIAGNOSIS — K5730 Diverticulosis of large intestine without perforation or abscess without bleeding: Secondary | ICD-10-CM | POA: Diagnosis not present

## 2022-07-20 DIAGNOSIS — Z8546 Personal history of malignant neoplasm of prostate: Secondary | ICD-10-CM | POA: Diagnosis not present

## 2022-07-20 DIAGNOSIS — I700 Atherosclerosis of aorta: Secondary | ICD-10-CM | POA: Diagnosis not present

## 2022-07-22 DIAGNOSIS — J069 Acute upper respiratory infection, unspecified: Secondary | ICD-10-CM | POA: Diagnosis not present

## 2022-07-22 DIAGNOSIS — R059 Cough, unspecified: Secondary | ICD-10-CM | POA: Diagnosis not present

## 2022-07-26 ENCOUNTER — Encounter (HOSPITAL_BASED_OUTPATIENT_CLINIC_OR_DEPARTMENT_OTHER): Payer: Self-pay | Admitting: Urology

## 2022-07-26 ENCOUNTER — Encounter: Payer: Self-pay | Admitting: Internal Medicine

## 2022-07-26 NOTE — Progress Notes (Signed)
Pre-op phone call complete. Procedure date and arrival time confirmed. Patient allergies, medical history, and medications verified. Patient advised to stop vitamins. Patient can have clear liquids until 0800. Driver secured.

## 2022-07-27 NOTE — H&P (Signed)
Office Visit Report     07/20/2022   --------------------------------------------------------------------------------   Adrian [name]  MRN: [id number]  DOB: [birth year], 61 year old Male  SSN: [id number]   PRIMARY CARE:  Estela Y. Isaac Bliss, MD  REFERRING:  Adrian Gravel, NP  PROVIDER:  Festus Aloe, M.D.  TREATING:  Adrian Gravel, NP  LOCATION:  [facility] [phone]     --------------------------------------------------------------------------------   CC/HPI: F/u -   1. Prostate cancer -status post robot-assisted laparoscopic radical prostatectomy June 2020. His last PSA June 2023 is undetectable.   2. Kidney stones-patient has a history of kidney stones. He underwent shockwave lithotripsy. KUB May 2023 revealed a 4 mm right lower pole stone and a 6 mm left midpole stone. CT scan of the abdomen and pelvis May 2023 revealed a 4 mm right lower pole stone and an 8 mm left midpole stone. He has not yet completed his 24-hour urine studies.    3. Erectile dysfunction - He has continued to have significant erectile dysfunction. He again tried oral medical therapy with sildenafil and tadalafil but did not note any significant improvement.     07/14/2022: 61 year old male who presents today with concerns of a right-sided obstructing distal ureteral stone. This was found on CT imaging in the ER. He has not seen a stone pass but denies any interval pain or discomfort. Urinalysis continues to have microscopic hematuria.   07/20/2022: [name] is a 61 year old man who presents today with concerns of gross hematuria. HE has a known right sided stone he has not seen past. He continues to have gross hematuria. KUB is inconclusive today. He denies fevers and chills. He reports his pain is tolerable but he is having bilateral flank pain that comes and goes. Hematuria has been intermittent and last occurred 2 days ago.     ALLERGIES: Oxycodone sildenafil - Dizziness  (Severe)    MEDICATIONS: Tadalafil 20 mg tablet 1 tablet PO prn as directed  Tamsulosin Hcl 0.4 mg capsule 1 capsule PO Q HS  Prostaglandin E1 100 % powder 1 ml Intracavernosal Daily PRN PGE 20 mcg/ml; supplies and needles     GU PSH: Cystoscopy Insert Stent, Right - 08/13/2020, Right - 2021 ESWL, Right - 2021, 2009, 2009 Laparoscopy; Lymphadenectomy - 2020 Prostate Needle Biopsy - 2020 Robotic Radical Prostatectomy - 2020 Ureteroscopic stone removal, Right - 08/13/2020       PSH Notes: Foot Surgery, Lithotripsy, Knee Arthroscopy, Lithotripsy   NON-GU PSH: Knee Arthroscopy, Left Partial Remove Foot Fascia, Bilateral Surgical Pathology, Gross And Microscopic Examination For Prostate Needle - 2020     GU PMH: Flank Pain - 07/14/2022, - 01/31/2022, - 01/18/2022, - 01/20/2021, - 2021 Renal and ureteral calculus - 07/14/2022, - 2022 (Stable), - 2021, - 2021, - 2021 ED due to arterial insufficiency, disc nature r/b/a to inj and IPP> He will try injections. - 03/03/2022, - 11/10/2021, - 04/05/2021, - 2022, - 2020 History of prostate cancer, PSA remains udt. - 03/03/2022 Renal calculus, Disc surv vs treatment. He will cont surv. - 03/03/2022, - 01/31/2022, - 01/18/2022, - 11/10/2021, - 04/05/2021, - 2021, Kidney stone on right side, - 2014 Pelvic/perineal pain - 01/31/2022, - 01/18/2022 Prostate Cancer - 11/10/2021, - 04/05/2021, - 2022, - 2021, - 2021, - 2020 Ureteral calculus - 09/01/2020 Gross hematuria - 2021 Stress Incontinence - 2020, - 2020 History of urolithiasis, Nephrolithiasis - 2014 Nocturia, Nocturia - 2014 Other microscopic hematuria, Microscopic hematuria - 2014      PMH Notes:  1) Prostate cancer: He is s/p a BNS RAL radical prostatectomy and BPLND on 02/21/19.   Diagnosis: pT3a N0 Mx, Gleason 3+4=7 adenocarcinoma with negative surgical margins  Pretreatment PSA: 7.9  Pretreatment SHIM score: 25   2) Urolithiasis:   Oct 2021: ESWL of 1 cm right renal pelvic stone  Oct 2021: Right  ureteral stent (infection and obstruction)  Dec 2021: Right ureteroscopic stone removal (calcium oxalate - mostly monohydrate)   NON-GU PMH: None   FAMILY HISTORY: Death of family member - Father Kidney Stones - Runs in Family Lung Cancer - Father    Notes: 1 son; 1 daughter   SOCIAL HISTORY: Marital Status: Married Preferred Language: English; Ethnicity: Not Hispanic Or Latino; Race: White Current Smoking Status: Patient has never smoked.   Tobacco Use Assessment Completed: Used Tobacco in last 30 days? Does not use smokeless tobacco. Has never drank.  Drinks 3 caffeinated drinks per day. Patient's occupation is/was [place] [employer].     Notes: Former smoker, Tobacco Use, Marital History - Currently Married, Occupation:, Alcohol Use, Caffeine Use   REVIEW OF SYSTEMS:    GU Review Male:   Patient reports frequent urination and leakage of urine. Patient denies hard to postpone urination, stream starts and stops, trouble starting your stream, have to strain to urinate , erection problems, and penile pain.  Gastrointestinal (Upper):   Patient denies nausea, vomiting, and indigestion/ heartburn.  Gastrointestinal (Lower):   Patient denies diarrhea and constipation.  Constitutional:   Patient denies fever, night sweats, weight loss, and fatigue.  Skin:   Patient denies skin rash/ lesion and itching.  Musculoskeletal:   Patient denies back pain and joint pain.  Neurological:   Patient denies headaches and dizziness.  Psychologic:   Patient denies depression and anxiety.   Notes: blood in urine yesterday    VITAL SIGNS:      07/20/2022 08:06 AM  BP 111/77 mmHg  Pulse 80 /min  Temperature 97.7 F / 36.5 C   MULTI-SYSTEM PHYSICAL EXAMINATION:    Constitutional: Well-nourished. No physical deformities. Normally developed. Good grooming.  Respiratory: No labored breathing, no use of accessory muscles.   Cardiovascular: Normal temperature, normal extremity pulses, no swelling, no  varicosities.  Skin: No paleness, no jaundice, no cyanosis. No lesion, no ulcer, no rash.  Neurologic / Psychiatric: Oriented to time, oriented to place, oriented to person. No depression, no anxiety, no agitation.  Gastrointestinal: No mass, no tenderness, no rigidity, non obese abdomen.     Complexity of Data:  Source Of History:  Patient  Records Review:   Previous Doctor Records, Previous Patient Records  Urine Test Review:   Urinalysis  X-Ray Review: KUB: Reviewed Films. Reviewed Report. Discussed With Patient.  Renal Ultrasound: Reviewed Films. Reviewed Report. Discussed With Patient.  C.T. Stone Protocol: Reviewed Films. Reviewed Report. Discussed With Patient.     02/24/22 11/03/21 03/16/21 07/02/20 12/03/19 05/22/19 11/05/18 08/28/18  PSA  Total PSA <0.015 ng/mL <0.015 ng/mL <0.015 ng/mL <0.015 ng/mL <0.015 ng/mL <0.015 ng/mL 8.09 ng/mL 6.94 ng/mL  Free PSA       0.99 ng/mL 0.99 ng/mL  % Free PSA       12 % PSA 14 % PSA    07/20/22  Urinalysis  Urine Appearance Clear   Urine Color Yellow   Urine Glucose Neg mg/dL  Urine Bilirubin Neg mg/dL  Urine Ketones Neg mg/dL  Urine Specific Gravity 1.025   Urine Blood 1+ ery/uL  Urine pH 5.5   Urine Protein Neg  mg/dL  Urine Urobilinogen 0.2 mg/dL  Urine Nitrites Neg   Urine Leukocyte Esterase Neg leu/uL  Urine WBC/hpf 0 - 5/hpf   Urine RBC/hpf 3 - 10/hpf   Urine Epithelial Cells 0 - 5/hpf   Urine Bacteria Rare (0-9/hpf)   Urine Mucous Present   Urine Yeast NS (Not Seen)   Urine Trichomonas Not Present   Urine Cystals NS (Not Seen)   Urine Casts NS (Not Seen)   Urine Sperm Not Present    PROCEDURES:         C.T. Urogram - [id number]      Patient confirmed No Neulasta OnPro Device.          KUB - [id number]  A single view of the abdomen is obtained. There is a prominent overlying bowel gas pattern but bilateral renal shadows are visualized. Within the left renal shadow there is a 6 mm opacity noted there is a stable pelvic  left sided phlebolith. In the right renal shadow there is a 4 mm opacity noted.      Patient confirmed No Neulasta OnPro Device.            Renal Ultrasound - [id number]  Right Kidney: Length: 11.0 cm Depth: 5.7 cm Cortical Width: 1.4 cm Width: 4.5 cm  Left Kidney: Length: 10.7 cm Depth: 7.2 cm Cortical Width: 1.3 cm Width: 5.6 cm  Left Kidney/Ureter:  Single non-obstructing calcification  Right Kidney/Ureter:  2 non-obstructing calcifications  Bladder:  PVR 17.5 ml      Patient confirmed No Neulasta OnPro Device.            Urinalysis w/Scope Dipstick Dipstick Cont'd Micro  Color: Yellow Bilirubin: Neg mg/dL WBC/hpf: 0 - 5/hpf  Appearance: Clear Ketones: Neg mg/dL RBC/hpf: 3 - 10/hpf  Specific Gravity: 1.025 Blood: 1+ ery/uL Bacteria: Rare (0-9/hpf)  pH: 5.5 Protein: Neg mg/dL Cystals: NS (Not Seen)  Glucose: Neg mg/dL Urobilinogen: 0.2 mg/dL Casts: NS (Not Seen)    Nitrites: Neg Trichomonas: Not Present    Leukocyte Esterase: Neg leu/uL Mucous: Present      Epithelial Cells: 0 - 5/hpf      Yeast: NS (Not Seen)      Sperm: Not Present    ASSESSMENT:      ICD-10 Details  1 GU:   Renal and ureteral calculus - N20.2 Bilateral, Chronic, Stable  2   Ureteral calculus - N20.1 Right, Acute, Uncomplicated, Resolved  3   Gross hematuria - F64.3 Acute, Uncomplicated   PLAN:           Orders X-Rays: C.T. Stone Protocol Without I.V. Contrast    KUB    Renal Ultrasound  X-Ray Notes: History:  Hematuria: Yes/No  Patient to see MD after exam: Yes/No  Previous exam: CT / IVP/ US/ KUB/ None  When:  Where:  Diabetic: Yes/ No  BUN/ Creatinine:  Date of last BUN Creatinine:  Weight in pounds:  Allergy- IV Contrast: Yes/ No  Conflicting diabetic meds: Yes/ No  Diabetic Meds:  Prior Authorization #: [id number]            Schedule Return Visit/Planned Activity: Next Available Appointment - Schedule Surgery          Document Letter(s):  Created for Patient:  Clinical Summary         Notes:   1. Renal and ureteral calculus: Due to continued gross hematuria and negative findings on KUB as well as renal ultrasound being negative for obstruction, I did advise CT scan. I  would like to ensure passage of right-sided distal renal stone prior to him undergoing left sided lithotripsy. CT imaging shows bilateral renal calculi but no ureteral or bladder calculi noted. There is also no hydronephrosis.   2. Left renal stone: He would like to pursue definitive stone intervention for left renal stone. Urinalysis will be sent for precautionary culture today. Stone intervention was discussed in detail today. For ureteroscopy, the patient understands that there is a chance for a staged procedure. Patient also understands that there is risk for bleeding, infection, injury to surrounding organs, and general risks of anesthesia. The patient also understands the placement of a stent and the risks of stent placement including, risk for infection, the risk for pain, and the risk for injury. For ESWL, the patient understands that there is a chance of failure of procedure, there is also a risk for bruising, infection, bleeding, and injury to surrounding structures. The patient verbalized understanding to these risks.  He would like to undergo a left sided lithotripsy. A green sheet was placed today. He was instructed on strict return precautions.   3. Gross hematuria: IF persistent would advise follow up with urologist for consideration of cystoscopy.     * Signed by Adrian Gravel, NP on 07/20/22 at 12:33 PM (EST)*      The information contained in this medical record document is considered private and confidential patient information. This information can only be used for the medical diagnosis and/or medical services that are being provided by the patient's selected caregivers. This information can only be distributed outside of the patient's care if the patient agrees and signs  waivers of authorization for this information to be sent to an outside source or route.

## 2022-07-28 ENCOUNTER — Ambulatory Visit (HOSPITAL_BASED_OUTPATIENT_CLINIC_OR_DEPARTMENT_OTHER)
Admission: RE | Admit: 2022-07-28 | Discharge: 2022-07-28 | Disposition: A | Discharge status: Home / Self Care | Payer: Federal, State, Local not specified - PPO | Attending: Urology | Admitting: Urology

## 2022-07-28 ENCOUNTER — Encounter (HOSPITAL_BASED_OUTPATIENT_CLINIC_OR_DEPARTMENT_OTHER)
Admission: RE | Disposition: A | Discharge status: Home / Self Care | Payer: Self-pay | Source: Home / Self Care | Attending: Urology

## 2022-07-28 ENCOUNTER — Other Ambulatory Visit: Payer: Self-pay

## 2022-07-28 ENCOUNTER — Ambulatory Visit (HOSPITAL_COMMUNITY): Payer: Federal, State, Local not specified - PPO

## 2022-07-28 ENCOUNTER — Encounter (HOSPITAL_BASED_OUTPATIENT_CLINIC_OR_DEPARTMENT_OTHER): Payer: Self-pay | Admitting: Urology

## 2022-07-28 DIAGNOSIS — N529 Male erectile dysfunction, unspecified: Secondary | ICD-10-CM | POA: Diagnosis not present

## 2022-07-28 DIAGNOSIS — Z87442 Personal history of urinary calculi: Secondary | ICD-10-CM | POA: Diagnosis not present

## 2022-07-28 DIAGNOSIS — N200 Calculus of kidney: Secondary | ICD-10-CM | POA: Insufficient documentation

## 2022-07-28 DIAGNOSIS — C61 Malignant neoplasm of prostate: Secondary | ICD-10-CM

## 2022-07-28 HISTORY — PX: EXTRACORPOREAL SHOCK WAVE LITHOTRIPSY: SHX1557

## 2022-07-28 SURGERY — LITHOTRIPSY, ESWL
Anesthesia: LOCAL | Laterality: Left

## 2022-07-28 MED ORDER — DIPHENHYDRAMINE HCL 25 MG PO CAPS
25.0000 mg | ORAL_CAPSULE | ORAL | Status: AC
  Administered 2022-07-28: 25 mg via ORAL

## 2022-07-28 MED ORDER — DIPHENHYDRAMINE HCL 25 MG PO CAPS
ORAL_CAPSULE | ORAL | Status: AC
  Filled 2022-07-28: qty 1

## 2022-07-28 MED ORDER — SODIUM CHLORIDE 0.9 % IV SOLN: INTRAVENOUS | Status: DC

## 2022-07-28 MED ORDER — DIAZEPAM 5 MG PO TABS
ORAL_TABLET | ORAL | Status: AC
  Filled 2022-07-28: qty 2

## 2022-07-28 MED ORDER — CIPROFLOXACIN HCL 500 MG PO TABS
ORAL_TABLET | ORAL | Status: AC
  Filled 2022-07-28: qty 1

## 2022-07-28 MED ORDER — CIPROFLOXACIN HCL 500 MG PO TABS
500.0000 mg | ORAL_TABLET | ORAL | Status: AC
  Administered 2022-07-28: 500 mg via ORAL

## 2022-07-28 MED ORDER — DIAZEPAM 5 MG PO TABS
10.0000 mg | ORAL_TABLET | ORAL | Status: AC
  Administered 2022-07-28: 10 mg via ORAL

## 2022-07-28 NOTE — Interval H&P Note (Signed)
History and Physical Interval Note:  07/28/2022 2:22 PM  Adrian [name]  has presented today for surgery, with the diagnosis of LEFT RENAL STONE.  The various methods of treatment have been discussed with the patient and family. After consideration of risks, benefits and other options for treatment, the patient has consented to  Procedure(s): LEFT EXTRACORPOREAL SHOCK WAVE LITHOTRIPSY (ESWL) (Left) as a surgical intervention.  The patient's history has been reviewed, patient examined, no change in status, stable for surgery.  I have reviewed the patient's chart and labs. 8 mm left UP stone noted on KUB. Pt well with no dysuria or fever.  Questions were answered to the patient's satisfaction.     Festus Aloe

## 2022-07-28 NOTE — Op Note (Signed)
Left 7-8 mm upper stone   LEFT ESWL   See [facility] full op note  Findings: stone faded well. Excellent targeting. He may need a staged procedure should he fail to pass the stone or stone fragments.

## 2022-07-29 ENCOUNTER — Encounter (HOSPITAL_BASED_OUTPATIENT_CLINIC_OR_DEPARTMENT_OTHER): Payer: Self-pay | Admitting: Urology

## 2022-08-11 DIAGNOSIS — N202 Calculus of kidney with calculus of ureter: Secondary | ICD-10-CM | POA: Diagnosis not present

## 2022-09-16 ENCOUNTER — Encounter (HOSPITAL_BASED_OUTPATIENT_CLINIC_OR_DEPARTMENT_OTHER): Payer: Self-pay | Admitting: Urology

## 2022-11-14 DIAGNOSIS — S61011D Laceration without foreign body of right thumb without damage to nail, subsequent encounter: Secondary | ICD-10-CM | POA: Diagnosis not present

## 2022-12-05 DIAGNOSIS — Z8546 Personal history of malignant neoplasm of prostate: Secondary | ICD-10-CM | POA: Diagnosis not present

## 2022-12-05 DIAGNOSIS — N200 Calculus of kidney: Secondary | ICD-10-CM | POA: Diagnosis not present

## 2022-12-05 DIAGNOSIS — N5201 Erectile dysfunction due to arterial insufficiency: Secondary | ICD-10-CM | POA: Diagnosis not present

## 2022-12-15 DIAGNOSIS — N5201 Erectile dysfunction due to arterial insufficiency: Secondary | ICD-10-CM | POA: Diagnosis not present

## 2022-12-27 DIAGNOSIS — M25512 Pain in left shoulder: Secondary | ICD-10-CM | POA: Diagnosis not present

## 2023-01-02 DIAGNOSIS — M25561 Pain in right knee: Secondary | ICD-10-CM | POA: Diagnosis not present

## 2023-01-11 DIAGNOSIS — M25512 Pain in left shoulder: Secondary | ICD-10-CM | POA: Diagnosis not present

## 2023-01-11 DIAGNOSIS — M25561 Pain in right knee: Secondary | ICD-10-CM | POA: Diagnosis not present

## 2023-01-23 DIAGNOSIS — M25512 Pain in left shoulder: Secondary | ICD-10-CM | POA: Diagnosis not present

## 2023-01-24 DIAGNOSIS — M25561 Pain in right knee: Secondary | ICD-10-CM | POA: Diagnosis not present

## 2023-04-06 DIAGNOSIS — W19XXXA Unspecified fall, initial encounter: Secondary | ICD-10-CM | POA: Diagnosis not present

## 2023-04-06 DIAGNOSIS — S80912A Unspecified superficial injury of left knee, initial encounter: Secondary | ICD-10-CM | POA: Diagnosis not present

## 2023-04-07 ENCOUNTER — Other Ambulatory Visit: Payer: Self-pay | Admitting: Medical

## 2023-04-07 ENCOUNTER — Ambulatory Visit: Admission: RE | Admit: 2023-04-07 | Payer: Federal, State, Local not specified - PPO | Source: Ambulatory Visit

## 2023-04-07 DIAGNOSIS — M25562 Pain in left knee: Secondary | ICD-10-CM

## 2023-04-07 DIAGNOSIS — M1712 Unilateral primary osteoarthritis, left knee: Secondary | ICD-10-CM | POA: Diagnosis not present

## 2023-04-07 DIAGNOSIS — M2342 Loose body in knee, left knee: Secondary | ICD-10-CM | POA: Diagnosis not present

## 2023-04-11 DIAGNOSIS — M545 Low back pain, unspecified: Secondary | ICD-10-CM | POA: Diagnosis not present

## 2023-04-12 ENCOUNTER — Encounter (INDEPENDENT_AMBULATORY_CARE_PROVIDER_SITE_OTHER): Payer: Self-pay

## 2023-04-13 DIAGNOSIS — M25562 Pain in left knee: Secondary | ICD-10-CM | POA: Diagnosis not present

## 2023-04-14 DIAGNOSIS — M25562 Pain in left knee: Secondary | ICD-10-CM | POA: Diagnosis not present

## 2023-04-17 DIAGNOSIS — M1712 Unilateral primary osteoarthritis, left knee: Secondary | ICD-10-CM | POA: Diagnosis not present

## 2023-04-17 DIAGNOSIS — S83242D Other tear of medial meniscus, current injury, left knee, subsequent encounter: Secondary | ICD-10-CM | POA: Diagnosis not present

## 2023-05-03 ENCOUNTER — Encounter: Payer: Self-pay | Admitting: Internal Medicine

## 2023-05-03 ENCOUNTER — Ambulatory Visit (INDEPENDENT_AMBULATORY_CARE_PROVIDER_SITE_OTHER): Payer: Federal, State, Local not specified - PPO | Admitting: Internal Medicine

## 2023-05-03 VITALS — BP 110/80 | HR 88 | Temp 97.9°F | Ht 72.0 in | Wt 205.5 lb

## 2023-05-03 DIAGNOSIS — E559 Vitamin D deficiency, unspecified: Secondary | ICD-10-CM | POA: Diagnosis not present

## 2023-05-03 DIAGNOSIS — Z0000 Encounter for general adult medical examination without abnormal findings: Secondary | ICD-10-CM | POA: Diagnosis not present

## 2023-05-03 DIAGNOSIS — C61 Malignant neoplasm of prostate: Secondary | ICD-10-CM | POA: Diagnosis not present

## 2023-05-03 DIAGNOSIS — N200 Calculus of kidney: Secondary | ICD-10-CM | POA: Diagnosis not present

## 2023-05-03 DIAGNOSIS — Z1159 Encounter for screening for other viral diseases: Secondary | ICD-10-CM

## 2023-05-03 DIAGNOSIS — Z114 Encounter for screening for human immunodeficiency virus [HIV]: Secondary | ICD-10-CM

## 2023-05-03 NOTE — Progress Notes (Signed)
Established Patient Office Visit     CC/Reason for Visit: Annual preventive exam  HPI: Adrian [name] is a 62 y.o. male who is coming in today for the above mentioned reasons. Past Medical History is significant for: Prostate cancer nephrolithiasis.  Has been feeling well other than knee pain after a fall for which she has already seen orthopedics.  Will be due for flu and COVID vaccines this fall.  Colonoscopy is up-to-date.   Past Medical/Surgical History: Past Medical History:  Diagnosis Date   Cancer [facility])    prostate   Dental crowns present    History of kidney stones    Plantar fasciitis of left and right  foot 05/2013    Past Surgical History:  Procedure Laterality Date   BREAST ENHANCEMENT SURGERY Left    asymmetry of chest wall   CLOSED REDUCTION METACARPAL WITH PERCUTANEOUS PINNING Right 12/22/2004   5th metacarpal   COLONOSCOPY  2012   CYSTOSCOPY WITH RETROGRADE PYELOGRAM, URETEROSCOPY AND STENT PLACEMENT Right 07/13/2020   Procedure: CYSTOSCOPY WITH RETROGRADE PYELOGRAM, RIGHT URETERAL STENT PLACEMENT;  Surgeon: Rene Paci, MD;  Location: [facility];  Service: Urology;  Laterality: Right;   CYSTOSCOPY/URETEROSCOPY/HOLMIUM LASER/STENT PLACEMENT Right 08/13/2020   Procedure: CYSTOSCOPY/URETEROSCOPY/RETROGRADE/ REMOVAL OF STONE/STENT EXCHANGE;  Surgeon: Heloise Purpura, MD;  Location: [facility];  Service: Urology;  Laterality: Right;   EXTRACORPOREAL SHOCK WAVE LITHOTRIPSY Right 07/09/2020   Procedure: EXTRACORPOREAL SHOCK WAVE LITHOTRIPSY (ESWL);  Surgeon: Heloise Purpura, MD;  [facility];  Service: Urology;  Laterality: Right;   EXTRACORPOREAL SHOCK WAVE LITHOTRIPSY Left 07/28/2022   Procedure: LEFT EXTRACORPOREAL SHOCK WAVE LITHOTRIPSY (ESWL);  Surgeon: Jerilee Field, MD;  Location: [facility];  Service: Urology;  Laterality: Left;   KNEE ARTHROSCOPY Left    LITHOTRIPSY     LYMPHADENECTOMY Bilateral 02/21/2019    Procedure: LYMPHADENECTOMY, PELVIC;  Surgeon: Heloise Purpura, MD;  Location: [facility];  Service: Urology;  Laterality: Bilateral;   MANDIBLE SURGERY     PLANTAR FASCIA RELEASE  06/28/2012   Procedure: ENDOSCOPIC PLANTAR FASCIOTOMY;  Surgeon: Loreta Ave, MD;  Location: [facility];  Service: Orthopedics;  Laterality: Right;   PLANTAR FASCIA RELEASE Left 06/13/2013   Procedure: ENDOSCOPIC PLANTAR FASCIOTOMY;  Surgeon: Loreta Ave, MD;  Location: [facility];  Service: Orthopedics;  Laterality: Left;   ROBOT ASSISTED LAPAROSCOPIC RADICAL PROSTATECTOMY N/A 02/21/2019   Procedure: XI ROBOTIC ASSISTED LAPAROSCOPIC RADICAL PROSTATECTOMY LEVEL 2;  Surgeon: Heloise Purpura, MD;  Location: [facility];  Service: Urology;  Laterality: N/A;    Social History:  reports that he has never smoked. He has never used smokeless tobacco. He reports that he does not drink alcohol and does not use drugs.  Allergies: Allergies  Allergen Reactions   Oxycodone Nausea And Vomiting   Hydrocodone Nausea And Vomiting    Family History:  Family History  Problem Relation Age of Onset   Colon cancer Neg Hx    Colon polyps Neg Hx    Esophageal cancer Neg Hx    Rectal cancer Neg Hx    Stomach cancer Neg Hx      Current Outpatient Medications:    Ascorbic Acid (VITAMIN C) 1000 MG tablet, Take 1,000 mg by mouth daily., Disp: , Rfl:    cholecalciferol (VITAMIN D3) 25 MCG (1000 UNIT) tablet, Take 1,000 Units by mouth daily., Disp: , Rfl:    zinc gluconate 50 MG tablet, Take 50 mg by mouth daily., Disp: ,  Rfl:   Review of Systems:  Negative unless indicated in HPI.   Physical Exam: Vitals:   05/03/23 1417  BP: 110/80  Pulse: 88  Temp: 97.9 F (36.6 C)  TempSrc: Oral  SpO2: 96%  Weight: 205 lb 8 oz (93.2 kg)  Height: 6' (1.829 m)    Body mass index is 27.87 kg/m.   Physical Exam Vitals reviewed.  Constitutional:      General: He is not in acute distress.     Appearance: Normal appearance. He is not ill-appearing, toxic-appearing or diaphoretic.  HENT:     Head: Normocephalic.     Right Ear: Tympanic membrane, ear canal and external ear normal. There is no impacted cerumen.     Left Ear: Tympanic membrane, ear canal and external ear normal. There is no impacted cerumen.     Nose: Nose normal.     Mouth/Throat:     Mouth: Mucous membranes are moist.     Pharynx: Oropharynx is clear. No oropharyngeal exudate or posterior oropharyngeal erythema.  Eyes:     General: No scleral icterus.       Right eye: No discharge.        Left eye: No discharge.     Conjunctiva/sclera: Conjunctivae normal.     Pupils: Pupils are equal, round, and reactive to light.  Neck:     Vascular: No carotid bruit.  Cardiovascular:     Rate and Rhythm: Normal rate and regular rhythm.     Pulses: Normal pulses.     Heart sounds: Normal heart sounds.  Pulmonary:     Effort: Pulmonary effort is normal. No respiratory distress.     Breath sounds: Normal breath sounds.  Abdominal:     General: Abdomen is flat. Bowel sounds are normal.     Palpations: Abdomen is soft.  Musculoskeletal:        General: Normal range of motion.     Cervical back: Normal range of motion.  Skin:    General: Skin is warm and dry.  Neurological:     General: No focal deficit present.     Mental Status: He is alert and oriented to person, place, and time. Mental status is at baseline.  Psychiatric:        Mood and Affect: Mood normal.        Behavior: Behavior normal.        Thought Content: Thought content normal.        Judgment: Judgment normal.     Flowsheet Row Office Visit from 05/03/2023 in [facility] [place]  PHQ-9 Total Score 0        Impression and Plan:  Encounter for preventive health examination -     Lipid panel; Future -     TSH; Future -     Vitamin B12; Future  Vitamin D deficiency -     VITAMIN D 25 Hydroxy (Vit-D Deficiency,  Fractures); Future  Nephrolithiasis  Prostate cancer (HCC) -     CBC with Differential/Platelet; Future -     Comprehensive metabolic panel; Future -     PSA; Future  Encounter for hepatitis C screening test for low risk patient -     Hepatitis C antibody; Future  Encounter for screening for HIV -     HIV Antibody (routine testing w rflx); Future   -Recommend routine eye and dental care. -Healthy lifestyle discussed in detail. -Labs to be updated today. -Prostate cancer screening: Recent PSA today 0, followed  by urology Health Maintenance  Topic Date Due   HIV Screening  Never done   Hepatitis C Screening  Never done   COVID-19 Vaccine (3 - Pfizer risk series) 02/11/2020   Flu Shot  12/11/2023*   DTaP/Tdap/Td vaccine (3 - Tdap) 06/13/2028   Colon Cancer Screening  09/16/2028   Zoster (Shingles) Vaccine  Completed   HPV Vaccine  Aged Out  *Topic was postponed. The date shown is not the original due date.        Chaya Jan, MD [facility]

## 2023-05-07 ENCOUNTER — Emergency Department (HOSPITAL_COMMUNITY)
Admission: EM | Admit: 2023-05-07 | Discharge: 2023-05-08 | Disposition: A | Discharge status: Home / Self Care | Payer: Federal, State, Local not specified - PPO | Attending: Emergency Medicine | Admitting: Emergency Medicine

## 2023-05-07 ENCOUNTER — Encounter (HOSPITAL_COMMUNITY): Payer: Self-pay

## 2023-05-07 ENCOUNTER — Other Ambulatory Visit: Payer: Self-pay

## 2023-05-07 ENCOUNTER — Emergency Department (HOSPITAL_COMMUNITY): Payer: Federal, State, Local not specified - PPO

## 2023-05-07 DIAGNOSIS — R079 Chest pain, unspecified: Secondary | ICD-10-CM | POA: Diagnosis not present

## 2023-05-07 DIAGNOSIS — R0789 Other chest pain: Secondary | ICD-10-CM | POA: Insufficient documentation

## 2023-05-07 DIAGNOSIS — R072 Precordial pain: Secondary | ICD-10-CM | POA: Diagnosis not present

## 2023-05-07 LAB — COMPREHENSIVE METABOLIC PANEL
ALT: 15 U/L (ref 0–44)
AST: 17 U/L (ref 15–41)
Albumin: 3.3 g/dL — ABNORMAL LOW (ref 3.5–5.0)
Alkaline Phosphatase: 74 U/L (ref 38–126)
Anion gap: 8 (ref 5–15)
BUN: 26 mg/dL — ABNORMAL HIGH (ref 8–23)
CO2: 22 mmol/L (ref 22–32)
Calcium: 8.9 mg/dL (ref 8.9–10.3)
Chloride: 109 mmol/L (ref 98–111)
Creatinine, Ser: 1.38 mg/dL — ABNORMAL HIGH (ref 0.61–1.24)
GFR, Estimated: 57 mL/min — ABNORMAL LOW (ref >60)
Glucose, Bld: 135 mg/dL — ABNORMAL HIGH (ref 70–99)
Potassium: 3.8 mmol/L (ref 3.5–5.1)
Sodium: 139 mmol/L (ref 135–145)
Total Bilirubin: 0.5 mg/dL (ref 0.3–1.2)
Total Protein: 6 g/dL — ABNORMAL LOW (ref 6.5–8.1)

## 2023-05-07 LAB — CBC WITH DIFFERENTIAL/PLATELET
Abs Immature Granulocytes: 0.02 10*3/uL (ref 0.00–0.07)
Basophils Absolute: 0.1 10*3/uL (ref 0.0–0.1)
Basophils Relative: 1 %
Eosinophils Absolute: 0.2 10*3/uL (ref 0.0–0.5)
Eosinophils Relative: 4 %
HCT: 42.7 % (ref 39.0–52.0)
Hemoglobin: 14.4 g/dL (ref 13.0–17.0)
Immature Granulocytes: 0 %
Lymphocytes Relative: 29 %
Lymphs Abs: 1.8 10*3/uL (ref 0.7–4.0)
MCH: 28.8 pg (ref 26.0–34.0)
MCHC: 33.7 g/dL (ref 30.0–36.0)
MCV: 85.4 fL (ref 80.0–100.0)
Monocytes Absolute: 0.6 10*3/uL (ref 0.1–1.0)
Monocytes Relative: 10 %
Neutro Abs: 3.4 10*3/uL (ref 1.7–7.7)
Neutrophils Relative %: 56 %
Platelets: 231 10*3/uL (ref 150–400)
RBC: 5 MIL/uL (ref 4.22–5.81)
RDW: 12.2 % (ref 11.5–15.5)
WBC: 6.2 10*3/uL (ref 4.0–10.5)
nRBC: 0 % (ref 0.0–0.2)

## 2023-05-07 LAB — LIPASE, BLOOD: Lipase: 40 U/L (ref 11–51)

## 2023-05-07 LAB — TROPONIN I (HIGH SENSITIVITY): Troponin I (High Sensitivity): 3 ng/L (ref <18)

## 2023-05-07 MED ORDER — ALUM & MAG HYDROXIDE-SIMETH 200-200-20 MG/5ML PO SUSP
30.0000 mL | Freq: Once | ORAL | Status: AC
  Administered 2023-05-07: 30 mL via ORAL
  Filled 2023-05-07: qty 30

## 2023-05-07 MED ORDER — FAMOTIDINE 20 MG PO TABS
40.0000 mg | ORAL_TABLET | Freq: Once | ORAL | Status: AC
  Administered 2023-05-07: 40 mg via ORAL
  Filled 2023-05-07: qty 2

## 2023-05-07 MED ORDER — SUCRALFATE 1 G PO TABS
1.0000 g | ORAL_TABLET | Freq: Once | ORAL | Status: AC
  Administered 2023-05-07: 1 g via ORAL
  Filled 2023-05-07: qty 1

## 2023-05-07 MED ORDER — PANTOPRAZOLE SODIUM 20 MG PO TBEC: 20.0000 mg | DELAYED_RELEASE_TABLET | Freq: Every day | ORAL | 0 refills | Status: DC

## 2023-05-07 MED ORDER — SUCRALFATE 1 G PO TABS: 1.0000 g | ORAL_TABLET | Freq: Four times a day (QID) | ORAL | 0 refills | Status: DC

## 2023-05-07 MED ORDER — LACTATED RINGERS IV SOLN: INTRAVENOUS | Status: DC

## 2023-05-07 NOTE — ED Provider Notes (Signed)
  Provider Note MRN:  [id number]  Arrival date & time: 05/08/23    ED Course and Medical Decision Making  Assumed care from Dr. Freida Busman at shift change.  Suspected noncardiac chest pain awaiting troponin.  Anticipating discharge.  Procedures  Final Clinical Impressions(s) / ED Diagnoses     ICD-10-CM   1. Atypical chest pain  R07.89       ED Discharge Orders          Ordered    pantoprazole (PROTONIX) 20 MG tablet  Daily        05/07/23 2322    sucralfate (CARAFATE) 1 g tablet  4 times daily        05/07/23 2322              Discharge Instructions      You were evaluated in the Emergency Department and after careful evaluation, we did not find any emergent condition requiring admission or further testing in the hospital.  Your exam/testing today is overall reassuring.  Please return to the Emergency Department if you experience any worsening of your condition.   Thank you for allowing [place] to be a part of your care.      Elmer Sow. Pilar Plate, MD [facility] Emergency Medicine [facility] [email or web address]    Sabas Sous, MD 05/08/23 [phone]

## 2023-05-07 NOTE — Discharge Instructions (Addendum)
You were evaluated in the Emergency Department and after careful evaluation, we did not find any emergent condition requiring admission or further testing in the hospital.  Your exam/testing today is overall reassuring.  Please return to the Emergency Department if you experience any worsening of your condition.   Thank you for allowing us to be a part of your care. 

## 2023-05-07 NOTE — ED Triage Notes (Signed)
Patient with no past pertinent med hx, c/o sudden onset 5/10 chest pain, unrelieved by 324 asa he took at home. EMS gave one SL nitroglycerin with no relief as well, patient denies n/v/d.

## 2023-05-07 NOTE — ED Provider Notes (Signed)
EMERGENCY DEPARTMENT AT [facility] Provider Note   CSN: [id number] Arrival date & time: 05/07/23  2047     History  Chief Complaint  Patient presents with   Chest Pain    Adrian [name] is a 62 y.o. male.  62 year old male presents with chest pain that began today at rest.  Says that it has been substernal and nonradiating.  No associated dyspnea or diaphoresis.  No nausea or vomiting.  Similar symptoms in the past associated with reflux.  No recent cough or congestion.  Called EMS and was given nitroglycerin which did not help.  Prior history of cardiac disease.  Does not take any medications regularly       Home Medications Prior to Admission medications   Medication Sig Start Date End Date Taking? Authorizing Provider  Ascorbic Acid (VITAMIN C) 1000 MG tablet Take 1,000 mg by mouth daily.    [provider]  cholecalciferol (VITAMIN D3) 25 MCG (1000 UNIT) tablet Take 1,000 Units by mouth daily.    [provider]  zinc gluconate 50 MG tablet Take 50 mg by mouth daily.    [provider]      Allergies    Oxycodone and Hydrocodone    Review of Systems   Review of Systems  All other systems reviewed and are negative.   Physical Exam Updated Vital Signs BP 126/74 (BP Location: Left Arm)   Pulse 80   Temp 98 F (36.7 C)   Resp 16   SpO2 97%  Physical Exam Vitals and nursing note reviewed.  Constitutional:      General: He is not in acute distress.    Appearance: Normal appearance. He is well-developed. He is not toxic-appearing.  HENT:     Head: Normocephalic and atraumatic.  Eyes:     General: Lids are normal.     Conjunctiva/sclera: Conjunctivae normal.     Pupils: Pupils are equal, round, and reactive to light.  Neck:     Thyroid: No thyroid mass.     Trachea: No tracheal deviation.  Cardiovascular:     Rate and Rhythm: Normal rate and regular rhythm.     Heart sounds: Normal heart sounds. No murmur  heard.    No gallop.  Pulmonary:     Effort: Pulmonary effort is normal. No respiratory distress.     Breath sounds: Normal breath sounds. No stridor. No decreased breath sounds, wheezing, rhonchi or rales.  Abdominal:     General: There is no distension.     Palpations: Abdomen is soft.     Tenderness: There is no abdominal tenderness. There is no rebound.  Musculoskeletal:        General: No tenderness. Normal range of motion.     Cervical back: Normal range of motion and neck supple.  Skin:    General: Skin is warm and dry.     Findings: No abrasion or rash.  Neurological:     Mental Status: He is alert and oriented to person, place, and time. Mental status is at baseline.     GCS: GCS eye subscore is 4. GCS verbal subscore is 5. GCS motor subscore is 6.     Cranial Nerves: Cranial nerves are intact. No cranial nerve deficit.     Sensory: No sensory deficit.     Motor: Motor function is intact.  Psychiatric:        Attention and Perception: Attention normal.        Speech:  Speech normal.        Behavior: Behavior normal.     ED Results / Procedures / Treatments   Labs (all labs ordered are listed, but only abnormal results are displayed) Labs Reviewed  LIPASE, BLOOD  CBC WITH DIFFERENTIAL/PLATELET  COMPREHENSIVE METABOLIC PANEL  TROPONIN I (HIGH SENSITIVITY)    EKG EKG Interpretation Date/Time:  Sunday May 07 2023 20:48:37 EDT Ventricular Rate:  73 PR Interval:  148 QRS Duration:  91 QT Interval:  373 QTC Calculation: 411 R Axis:   76  Text Interpretation: Sinus rhythm Atrial premature complex No significant change since last tracing Confirmed by Lorre Nick ([id number]) on 05/07/2023 8:58:20 PM  Radiology No results found.  Procedures Procedures    Medications Ordered in ED Medications  lactated ringers infusion (has no administration in time range)  alum & mag hydroxide-simeth (MAALOX/MYLANTA) 200-200-20 MG/5ML suspension 30 mL (has no administration  in time range)  sucralfate (CARAFATE) tablet 1 g (has no administration in time range)  famotidine (PEPCID) tablet 40 mg (has no administration in time range)    ED Course/ Medical Decision Making/ A&P                                 Medical Decision Making Amount and/or Complexity of Data Reviewed Labs: ordered. Radiology: ordered.  Risk OTC drugs. Prescription drug management.   Patient treated for likely acid reflux and feels much better.  For troponin is negative here.  Patient is EKG without signs of acute coronary ischemia.  Sinus rhythm.  Plan will be for delta troponin and discharge home        Final Clinical Impression(s) / ED Diagnoses Final diagnoses:  None    Rx / DC Orders ED Discharge Orders     None         Lorre Nick, MD 05/07/23 2320

## 2023-05-08 LAB — TROPONIN I (HIGH SENSITIVITY): Troponin I (High Sensitivity): 4 ng/L (ref <18)

## 2023-05-11 ENCOUNTER — Telehealth: Payer: Self-pay

## 2023-05-11 ENCOUNTER — Other Ambulatory Visit (INDEPENDENT_AMBULATORY_CARE_PROVIDER_SITE_OTHER): Payer: Federal, State, Local not specified - PPO

## 2023-05-11 DIAGNOSIS — Z0000 Encounter for general adult medical examination without abnormal findings: Secondary | ICD-10-CM | POA: Diagnosis not present

## 2023-05-11 DIAGNOSIS — E559 Vitamin D deficiency, unspecified: Secondary | ICD-10-CM

## 2023-05-11 DIAGNOSIS — Z1159 Encounter for screening for other viral diseases: Secondary | ICD-10-CM

## 2023-05-11 DIAGNOSIS — C61 Malignant neoplasm of prostate: Secondary | ICD-10-CM

## 2023-05-11 DIAGNOSIS — Z114 Encounter for screening for human immunodeficiency virus [HIV]: Secondary | ICD-10-CM

## 2023-05-11 LAB — CBC WITH DIFFERENTIAL/PLATELET
Basophils Absolute: 0.1 10*3/uL (ref 0.0–0.1)
Basophils Relative: 1.2 % (ref 0.0–3.0)
Eosinophils Absolute: 0.2 10*3/uL (ref 0.0–0.7)
Eosinophils Relative: 3.2 % (ref 0.0–5.0)
HCT: 48 % (ref 39.0–52.0)
Hemoglobin: 15.9 g/dL (ref 13.0–17.0)
Lymphocytes Relative: 33.3 % (ref 12.0–46.0)
Lymphs Abs: 2.1 10*3/uL (ref 0.7–4.0)
MCHC: 33.2 g/dL (ref 30.0–36.0)
MCV: 87.9 fl (ref 78.0–100.0)
Monocytes Absolute: 0.6 10*3/uL (ref 0.1–1.0)
Monocytes Relative: 9.3 % (ref 3.0–12.0)
Neutro Abs: 3.4 10*3/uL (ref 1.4–7.7)
Neutrophils Relative %: 53 % (ref 43.0–77.0)
Platelets: 251 10*3/uL (ref 150.0–400.0)
RBC: 5.47 Mil/uL (ref 4.22–5.81)
RDW: 13.4 % (ref 11.5–15.5)
WBC: 6.3 10*3/uL (ref 4.0–10.5)

## 2023-05-11 LAB — COMPREHENSIVE METABOLIC PANEL
ALT: 16 U/L (ref 0–53)
AST: 16 U/L (ref 0–37)
Albumin: 4.2 g/dL (ref 3.5–5.2)
Alkaline Phosphatase: 85 U/L (ref 39–117)
BUN: 22 mg/dL (ref 6–23)
CO2: 27 mEq/L (ref 19–32)
Calcium: 9.5 mg/dL (ref 8.4–10.5)
Chloride: 104 mEq/L (ref 96–112)
Creatinine, Ser: 1.2 mg/dL (ref 0.40–1.50)
GFR: 64.28 mL/min (ref >60.00)
Glucose, Bld: 84 mg/dL (ref 70–99)
Potassium: 4.1 mEq/L (ref 3.5–5.1)
Sodium: 139 mEq/L (ref 135–145)
Total Bilirubin: 0.5 mg/dL (ref 0.2–1.2)
Total Protein: 7.1 g/dL (ref 6.0–8.3)

## 2023-05-11 LAB — LIPID PANEL
Cholesterol: 164 mg/dL (ref 0–200)
HDL: 51 mg/dL (ref >39.00)
LDL Cholesterol: 96 mg/dL (ref 0–99)
NonHDL: 113.2
Total CHOL/HDL Ratio: 3
Triglycerides: 86 mg/dL (ref 0.0–149.0)
VLDL: 17.2 mg/dL (ref 0.0–40.0)

## 2023-05-11 LAB — TSH: TSH: 1.21 u[IU]/mL (ref 0.35–5.50)

## 2023-05-11 LAB — VITAMIN D 25 HYDROXY (VIT D DEFICIENCY, FRACTURES): VITD: 49.7 ng/mL (ref 30.00–100.00)

## 2023-05-11 LAB — VITAMIN B12: Vitamin B-12: 281 pg/mL (ref 211–911)

## 2023-05-11 NOTE — Transitions of Care (Post Inpatient/ED Visit) (Addendum)
   05/11/2023  Name: Adrian [name] MRN: [id number] DOB: 08-03-1961  Today's TOC FU Call Status: Today's TOC FU Call Status:: Successful TOC FU Call Completed TOC FU Call Complete Date: 05/11/23 Patient's Name and Date of Birth confirmed.  Red on EMMI-ED Discharge Alert Date & Reason:05/10/23 "Scheduled follow-up appt? No"   Transition Care Management Follow-up Telephone Call Date of Discharge: 05/07/23 Discharge [facility]: [facility]) Type of Discharge: Emergency Department Reason for ED Visit: Other: ("atypical chest pain") How have you been since you were released from the hospital?: Better (Pt pleased to report that he "thought he was having heart attack but turned out to be just acid reflux." He is taking meds prescribed and have had no further sxs/issues. States 'everything back to normal.") Any questions or concerns?: No  Items Reviewed: Did you receive and understand the discharge instructions provided?: Yes Medications obtained,verified, and reconciled?: Yes (Medications Reviewed) Any new allergies since your discharge?: No Dietary orders reviewed?: Yes Type of Diet Ordered:: heart healthy Do you have support at home?: Yes People in Home: spouse  Medications Reviewed Today: Medications Reviewed Today     Reviewed by Charlyn Minerva, RN (Registered Nurse) on 05/11/23 at 1245  Med List Status: <None>   Medication Order Taking? Sig Documenting Provider Last Dose Status Informant  pantoprazole (PROTONIX) 20 MG tablet [id number] Yes Take 1 tablet (20 mg total) by mouth daily. Lorre Nick, MD Taking Active   sucralfate (CARAFATE) 1 g tablet [id number] Yes Take 1 tablet (1 g total) by mouth 4 (four) times daily. Lorre Nick, MD Taking Active             Home Care and Equipment/Supplies: Were Home Health Services Ordered?: NA Any new equipment or medical supplies ordered?: NA  Functional Questionnaire: Do you need assistance with bathing/showering or  dressing?: No Do you need assistance with meal preparation?: No Do you need assistance with eating?: No Do you have difficulty maintaining continence: No Do you need assistance with getting out of bed/getting out of a chair/moving?: No  Follow up appointments reviewed: PCP Follow-up appointment confirmed?: No (Pt states he completed lab work appt today at PCP office.Office to call if appt needed- just saw PCP on 05/03/23) MD Provider Line Number:[phone] Given: No Specialist Hospital Follow-up appointment confirmed?: NA Do you need transportation to your follow-up appointment?: No Do you understand care options if your condition(s) worsen?: Yes-patient verbalized understanding  SDOH Interventions Today    Flowsheet Row Most Recent Value  SDOH Interventions   Food Insecurity Interventions Intervention Not Indicated  Transportation Interventions Intervention Not Indicated       Interventions Today    Flowsheet Row Most Recent Value  General Interventions   General Interventions Discussed/Reviewed General Interventions Discussed, Doctor Visits  Doctor Visits Discussed/Reviewed Doctor Visits Discussed, PCP  PCP/Specialist Visits Compliance with follow-up visit  Education Interventions   Education Provided Provided Education  Provided Verbal Education On Nutrition, When to see the doctor, Medication, Other  [sx mgmt]  Nutrition Interventions   Nutrition Discussed/Reviewed Nutrition Discussed  Pharmacy Interventions   Pharmacy Dicussed/Reviewed Pharmacy Topics Discussed, Medications and their functions       Alessandra Grout [facility] Care Management Care Management Community Coordinator Direct Phone: [phone] Toll Free: [phone] Fax: [phone]

## 2023-05-12 LAB — HEPATITIS C ANTIBODY: Hepatitis C Ab: NONREACTIVE

## 2023-05-12 LAB — HIV ANTIBODY (ROUTINE TESTING W REFLEX): HIV 1&2 Ab, 4th Generation: NONREACTIVE

## 2023-06-21 DIAGNOSIS — M7542 Impingement syndrome of left shoulder: Secondary | ICD-10-CM | POA: Diagnosis not present

## 2023-07-06 DIAGNOSIS — M79641 Pain in right hand: Secondary | ICD-10-CM | POA: Diagnosis not present

## 2023-07-07 DIAGNOSIS — S43432A Superior glenoid labrum lesion of left shoulder, initial encounter: Secondary | ICD-10-CM | POA: Diagnosis not present

## 2023-07-07 DIAGNOSIS — M7522 Bicipital tendinitis, left shoulder: Secondary | ICD-10-CM | POA: Diagnosis not present

## 2023-07-07 DIAGNOSIS — M7542 Impingement syndrome of left shoulder: Secondary | ICD-10-CM | POA: Diagnosis not present

## 2023-07-07 DIAGNOSIS — X58XXXA Exposure to other specified factors, initial encounter: Secondary | ICD-10-CM | POA: Diagnosis not present

## 2023-07-07 DIAGNOSIS — M67814 Other specified disorders of tendon, left shoulder: Secondary | ICD-10-CM | POA: Diagnosis not present

## 2023-07-07 DIAGNOSIS — M7552 Bursitis of left shoulder: Secondary | ICD-10-CM | POA: Diagnosis not present

## 2023-07-07 DIAGNOSIS — M24112 Other articular cartilage disorders, left shoulder: Secondary | ICD-10-CM | POA: Diagnosis not present

## 2023-07-07 DIAGNOSIS — M19012 Primary osteoarthritis, left shoulder: Secondary | ICD-10-CM | POA: Diagnosis not present

## 2023-07-07 DIAGNOSIS — M24612 Ankylosis, left shoulder: Secondary | ICD-10-CM | POA: Diagnosis not present

## 2023-07-07 DIAGNOSIS — M75122 Complete rotator cuff tear or rupture of left shoulder, not specified as traumatic: Secondary | ICD-10-CM | POA: Diagnosis not present

## 2023-07-07 DIAGNOSIS — Y999 Unspecified external cause status: Secondary | ICD-10-CM | POA: Diagnosis not present

## 2023-07-07 DIAGNOSIS — G8918 Other acute postprocedural pain: Secondary | ICD-10-CM | POA: Diagnosis not present

## 2023-07-07 DIAGNOSIS — M65912 Unspecified synovitis and tenosynovitis, left shoulder: Secondary | ICD-10-CM | POA: Diagnosis not present

## 2023-07-07 HISTORY — PX: ROTATOR CUFF REPAIR: SHX139

## 2023-07-17 DIAGNOSIS — M7542 Impingement syndrome of left shoulder: Secondary | ICD-10-CM | POA: Diagnosis not present

## 2023-07-17 DIAGNOSIS — M25512 Pain in left shoulder: Secondary | ICD-10-CM | POA: Diagnosis not present

## 2023-07-17 DIAGNOSIS — Z4789 Encounter for other orthopedic aftercare: Secondary | ICD-10-CM | POA: Diagnosis not present

## 2023-07-17 DIAGNOSIS — M75102 Unspecified rotator cuff tear or rupture of left shoulder, not specified as traumatic: Secondary | ICD-10-CM | POA: Diagnosis not present

## 2023-07-23 DIAGNOSIS — R0981 Nasal congestion: Secondary | ICD-10-CM | POA: Diagnosis not present

## 2023-07-23 DIAGNOSIS — R059 Cough, unspecified: Secondary | ICD-10-CM | POA: Diagnosis not present

## 2023-07-26 DIAGNOSIS — M7542 Impingement syndrome of left shoulder: Secondary | ICD-10-CM | POA: Diagnosis not present

## 2023-07-26 DIAGNOSIS — Z4789 Encounter for other orthopedic aftercare: Secondary | ICD-10-CM | POA: Diagnosis not present

## 2023-07-26 DIAGNOSIS — M75102 Unspecified rotator cuff tear or rupture of left shoulder, not specified as traumatic: Secondary | ICD-10-CM | POA: Diagnosis not present

## 2023-07-26 DIAGNOSIS — M25512 Pain in left shoulder: Secondary | ICD-10-CM | POA: Diagnosis not present

## 2023-07-30 ENCOUNTER — Emergency Department (HOSPITAL_BASED_OUTPATIENT_CLINIC_OR_DEPARTMENT_OTHER)
Admission: EM | Admit: 2023-07-30 | Discharge: 2023-07-31 | Disposition: A | Discharge status: Home / Self Care | Payer: Federal, State, Local not specified - PPO | Attending: Emergency Medicine | Admitting: Emergency Medicine

## 2023-07-30 ENCOUNTER — Encounter (HOSPITAL_BASED_OUTPATIENT_CLINIC_OR_DEPARTMENT_OTHER): Payer: Self-pay

## 2023-07-30 DIAGNOSIS — K5641 Fecal impaction: Secondary | ICD-10-CM | POA: Diagnosis not present

## 2023-07-30 DIAGNOSIS — K5900 Constipation, unspecified: Secondary | ICD-10-CM | POA: Diagnosis not present

## 2023-07-30 MED ORDER — MINERAL OIL RE ENEM
1.0000 | ENEMA | Freq: Once | RECTAL | Status: AC
  Administered 2023-07-30: 1 via RECTAL
  Filled 2023-07-30: qty 1

## 2023-07-30 NOTE — ED Provider Notes (Signed)
  [place] EMERGENCY DEPARTMENT AT [facility] [place] Provider Note   CSN: [id number] Arrival date & time: 07/30/23  2122     History  No chief complaint on file.   Adrian [name] is a 62 y.o. male.  Patient is a 62 year old male presenting with complaints of constipation.  He had shoulder surgery 2 weeks ago and has been taking antibiotics, steroids, and pain medication since.  He has been unable to have a bowel movement for the past few days.  He feels as though he is "locked up".  No fevers or chills.  No abdominal pain.  He has tried a enema at home with little relief.  The history is provided by the patient.       Home Medications Prior to Admission medications   Medication Sig Start Date End Date Taking? Authorizing Provider  pantoprazole (PROTONIX) 20 MG tablet Take 1 tablet (20 mg total) by mouth daily. 05/07/23   Lorre Nick, MD  sucralfate (CARAFATE) 1 g tablet Take 1 tablet (1 g total) by mouth 4 (four) times daily. 05/07/23   Lorre Nick, MD      Allergies    Oxycodone and Hydrocodone    Review of Systems   Review of Systems  All other systems reviewed and are negative.   Physical Exam Updated Vital Signs BP (!) 144/111   Pulse (!) 116   Temp 98 F (36.7 C)   Resp 20   Ht 5\' 11"  (1.803 m)   Wt 90.7 kg   SpO2 96%   BMI 27.89 kg/m  Physical Exam Vitals and nursing note reviewed.  Constitutional:      Appearance: Normal appearance.  Pulmonary:     Effort: Pulmonary effort is normal.  Genitourinary:    Comments: Rectal exam does reveal a fecal impaction.  Stool is brown and nonbloody.  No hemorrhoids or palpable abnormality on rectal exam. Skin:    General: Skin is warm and dry.  Neurological:     Mental Status: He is alert and oriented to person, place, and time.     ED Results / Procedures / Treatments   Labs (all labs ordered are listed, but only abnormal results are displayed) Labs Reviewed - No data to  display  EKG None  Radiology No results found.  Procedures Procedures  {Document cardiac monitor, telemetry assessment procedure when appropriate:1}  Medications Ordered in ED Medications  mineral oil enema 1 enema (has no administration in time range)    ED Course/ Medical Decision Making/ A&P   {   Click here for ABCD2, HEART and other calculatorsREFRESH Note before signing :1}                              Medical Decision Making Risk OTC drugs.   ***  {Document critical care time when appropriate:1} {Document review of labs and clinical decision tools ie heart score, Chads2Vasc2 etc:1}  {Document your independent review of radiology images, and any outside records:1} {Document your discussion with family members, caretakers, and with consultants:1} {Document social determinants of health affecting pt's care:1} {Document your decision making why or why not admission, treatments were needed:1} Final Clinical Impression(s) / ED Diagnoses Final diagnoses:  None    Rx / DC Orders ED Discharge Orders     None

## 2023-07-30 NOTE — ED Notes (Signed)
Pt unable to sit on bed, so has chosen to stand. Unable to put patient on vital sign monitoring.

## 2023-07-30 NOTE — ED Triage Notes (Signed)
Pt states he is "locked up" Has tried Miralax and fleets enema and had no results States his pain 6/10 He can not sit down in triage

## 2023-07-31 NOTE — Discharge Instructions (Signed)
Be sure to take stool softeners and drink plenty of fluids along with your pain medication.  I would recommend Colace 100 mg twice daily.  Return to the emergency department for severe abdominal pain, high fevers, bloody stools, or for other new and concerning symptoms.

## 2023-08-02 DIAGNOSIS — K5900 Constipation, unspecified: Secondary | ICD-10-CM | POA: Diagnosis not present

## 2023-08-02 DIAGNOSIS — Z4789 Encounter for other orthopedic aftercare: Secondary | ICD-10-CM | POA: Diagnosis not present

## 2023-08-02 DIAGNOSIS — M75102 Unspecified rotator cuff tear or rupture of left shoulder, not specified as traumatic: Secondary | ICD-10-CM | POA: Diagnosis not present

## 2023-08-02 DIAGNOSIS — M25512 Pain in left shoulder: Secondary | ICD-10-CM | POA: Diagnosis not present

## 2023-08-02 DIAGNOSIS — M7542 Impingement syndrome of left shoulder: Secondary | ICD-10-CM | POA: Diagnosis not present

## 2023-08-03 ENCOUNTER — Telehealth: Payer: Self-pay | Admitting: Gastroenterology

## 2023-08-03 MED ORDER — AMBULATORY NON FORMULARY MEDICATION: 0 refills | Status: DC

## 2023-08-03 NOTE — Telephone Encounter (Signed)
I have spoken to patient to advise of addition of nitroglycerin gel, pea sized twice daily inside the rectum x 6 weeks. Discussed the importance of using this medication as directed and for duration recommended. Rx sent to [facility].

## 2023-08-03 NOTE — Telephone Encounter (Signed)
Inbound call from patients wife stating that patient was seen in the ED on 11/15 for fecal impaction and  he has been bleeding since then. Requesting a call to discuss. Please advise.

## 2023-08-03 NOTE — Telephone Encounter (Signed)
Patient calls indicating that he recently had a fecal impaction following taking hydrocodone for orthopedic issues. States last week, he began having lower abdominal pain and bloating. Tried magnesium citrate and fleets enema over the counter but this was not effective. Since he was in pain, he went to ER. They were able to give soap suds enema with relief of impaction. Patient states that he is now able to move his bowels and is passing gas, no abdominal pain at this point. However, he has began having rectal bleeding; small amounts in toilet and on toilet tissue. Patient also states that he does not want to have bowel movements now because when he has a bowel movement and afterwards, he has sharp pain inside the rectum. States he was given hydrocortisone cream by an urgent care physician but this has not helped much.   We discussed that he would need office visit for further evaluation of rectal pain and bleeding. He is to see Quentin Mulling, PA-C on 08/14/23 at 9:30 am.  In the meantime, we discussed that he should eat foods high in fiber, increase water intake to at least 64 ounces per day and increase exercise/physical activity to aid in movement of stool through the colon. I have also recommended the addition of a fiber supplement (metamucil or benefiber) daily and Miralax (OTC) 17 grams dissolved in at least 8 ounces water/juice once daily with titration as needed based on bowel movements. Encouraged him to purchase lidocaine 5% gel over the counter and use as directed for rectal pain. Recommended sitz baths several times daily. Provided ER precautions as well.

## 2023-08-03 NOTE — Telephone Encounter (Signed)
===  View-only below this line=== ----- Message ----- From: Shellia Cleverly, DO Sent: 08/03/2023   3:47 PM EST To: Richardson Chiquito, RN  Based on clinical description, may have anal fissure now as a result of constipation/fecal impaction.  Please send in Rx for topical NTG 0.125%, to apply pea-sized amount twice daily for 6 weeks.  Keep follow-up appointment with Marchelle Folks as scheduled.  Otherwise, agree with recommendations as you outlined with a goal of keeping stools soft and easy to pass without straining, hard stools, etc.  Continue adequate hydration with at least 64 ounces of water daily.

## 2023-08-07 DIAGNOSIS — M7542 Impingement syndrome of left shoulder: Secondary | ICD-10-CM | POA: Diagnosis not present

## 2023-08-07 DIAGNOSIS — M25512 Pain in left shoulder: Secondary | ICD-10-CM | POA: Diagnosis not present

## 2023-08-07 DIAGNOSIS — Z4789 Encounter for other orthopedic aftercare: Secondary | ICD-10-CM | POA: Diagnosis not present

## 2023-08-07 DIAGNOSIS — M75102 Unspecified rotator cuff tear or rupture of left shoulder, not specified as traumatic: Secondary | ICD-10-CM | POA: Diagnosis not present

## 2023-08-14 ENCOUNTER — Encounter: Payer: Self-pay | Admitting: Physician Assistant

## 2023-08-14 ENCOUNTER — Ambulatory Visit (INDEPENDENT_AMBULATORY_CARE_PROVIDER_SITE_OTHER): Payer: Federal, State, Local not specified - PPO | Admitting: Physician Assistant

## 2023-08-14 VITALS — BP 128/82 | HR 83 | Ht 71.0 in | Wt 207.1 lb

## 2023-08-14 DIAGNOSIS — K5903 Drug induced constipation: Secondary | ICD-10-CM | POA: Diagnosis not present

## 2023-08-14 DIAGNOSIS — K602 Anal fissure, unspecified: Secondary | ICD-10-CM

## 2023-08-14 DIAGNOSIS — M75102 Unspecified rotator cuff tear or rupture of left shoulder, not specified as traumatic: Secondary | ICD-10-CM | POA: Diagnosis not present

## 2023-08-14 DIAGNOSIS — T402X5A Adverse effect of other opioids, initial encounter: Secondary | ICD-10-CM

## 2023-08-14 DIAGNOSIS — K625 Hemorrhage of anus and rectum: Secondary | ICD-10-CM | POA: Diagnosis not present

## 2023-08-14 DIAGNOSIS — K649 Unspecified hemorrhoids: Secondary | ICD-10-CM | POA: Diagnosis not present

## 2023-08-14 DIAGNOSIS — Z4789 Encounter for other orthopedic aftercare: Secondary | ICD-10-CM | POA: Diagnosis not present

## 2023-08-14 DIAGNOSIS — M25512 Pain in left shoulder: Secondary | ICD-10-CM | POA: Diagnosis not present

## 2023-08-14 DIAGNOSIS — M7542 Impingement syndrome of left shoulder: Secondary | ICD-10-CM | POA: Diagnosis not present

## 2023-08-14 NOTE — Progress Notes (Signed)
08/14/2023 Adrian [name] [id number] Sep 04, 1961  Referring provider: Philip Aspen, [place]* Primary GI doctor: Dr. Barron Alvine  ASSESSMENT AND PLAN:   Posterior anal fissure Sitz baths, high-fiber diet, add on benefiber.  Long discussion about preventing constipation discussed in detail for prevention, add miralax, continue walking. Continue NTG with gloved hand for another 4 weeks, can mix with vaseline to prevent HA's Follow up 2-3 months for secondary evaluation and possible surgical referral for repair if not improved.  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  Follow up 2-3 months  Screening colon  Recall 7 years.  Patient Care Team: Philip Aspen, Limmie Patricia, MD as PCP - General (Internal Medicine)  HISTORY OF PRESENT ILLNESS: 62 y.o. male with a past medical history of prostate cancer vitamin D deficiency history of kidney stones and others listed below presents for evaluation of rectal bleeding and pain.   01/2011 colonoscopy sigmoid diverticulosis 09/16/2021 colonoscopy with Dr. Barron Alvine for routine screening excellent prep 2 polyps 2 to 4 mm transverse colon cecum, 6 mm polyp sigmoid colon diverticulosis sigmoid colon nonbleeding internal hemorrhoids.  Pathology showed 1 polyp tubular adenomatous the others were hyperplastic recall 7 years  07/30/2023 ER visit for constipation after shoulder surgery after taking antibiotics, steroids and pain medications had tried enema without relief, attempted disimpaction unsuccessful in the ER however good results with soapsuds enemas. 08/03/2023 patient called with rectal bleeding scan to add on MiraLAX fiber and given nitroglycerin for possible rectal fissure  Will have constipation occ but has not had anything like that prior. He tried enemas and magnesium citrate, finally had soap suds enemas that  helped.  He had AB pain with the constipation but this has resolved.  He is 6 weeks out from his left shoulder surgery, has been off opioids.  He started to have BRB on TP and in the toilet.  He has been applying the NTG that was given, he states he has headache   He  reports that he has never smoked. He has never used smokeless tobacco. He reports that he does not drink alcohol and does not use drugs.  RELEVANT LABS AND IMAGING:  CBC    Component Value Date/Time   WBC 6.3 05/11/2023 0953   RBC 5.47 05/11/2023 0953   HGB 15.9 05/11/2023 0953   HCT 48.0 05/11/2023 0953   PLT 251.0 05/11/2023 0953   MCV 87.9 05/11/2023 0953   MCH 28.8 05/07/2023 2128   MCHC 33.2 05/11/2023 0953   RDW 13.4 05/11/2023 0953   LYMPHSABS 2.1 05/11/2023 0953   MONOABS 0.6 05/11/2023 0953   EOSABS 0.2 05/11/2023 0953   BASOSABS 0.1 05/11/2023 0953   Recent Labs    05/07/23 2128 05/11/23 0953  HGB 14.4 15.9    CMP     Component Value Date/Time   NA 139 05/11/2023 0953   K 4.1 05/11/2023 0953   CL 104 05/11/2023 0953   CO2 27 05/11/2023 0953   GLUCOSE 84 05/11/2023 0953   BUN 22 05/11/2023 0953   CREATININE 1.20 05/11/2023 0953   CREATININE 1.29 (H) 08/27/2020 0909   CALCIUM 9.5 05/11/2023 0953   PROT 7.1 05/11/2023 0953   ALBUMIN 4.2 05/11/2023 0953   AST 16 05/11/2023 0953   ALT 16 05/11/2023 0953   ALKPHOS 85 05/11/2023 0953   BILITOT 0.5 05/11/2023 0953   GFRNONAA 57 (L) 05/07/2023 2128  GFRAA >60 02/18/2019 0939      Latest Ref Rng & Units 05/11/2023    9:53 AM 05/07/2023    9:28 PM 07/13/2021   10:28 AM  Hepatic Function  Total Protein 6.0 - 8.3 g/dL 7.1  6.0  7.1   Albumin 3.5 - 5.2 g/dL 4.2  3.3  4.3   AST 0 - 37 U/L 16  17  20    ALT 0 - 53 U/L 16  15  16    Alk Phosphatase 39 - 117 U/L 85  74  83   Total Bilirubin 0.2 - 1.2 mg/dL 0.5  0.5  0.5       Current Medications:        Current Outpatient Medications (Other):    AMBULATORY NON FORMULARY MEDICATION,  Medication Name: Nitroglycerin 0.125% Apply pea sized amount inside rectum twice daily x 6 weeks  Medical History:  Past Medical History:  Diagnosis Date   Cancer [facility])    prostate   Dental crowns present    History of kidney stones    Plantar fasciitis of left and right  foot 05/2013   Allergies:  Allergies  Allergen Reactions   Oxycodone Nausea And Vomiting   Hydrocodone Nausea And Vomiting   Methocarbamol Nausea Only     Surgical History:  He  has a past surgical history that includes Knee arthroscopy (Left); Lithotripsy; Mandible surgery; Plantar fascia release (06/28/2012); Breast enhancement surgery (Left); Closed reduction metacarpal with percutaneous pinning (Right, 12/22/2004); Plantar fascia release (Left, 06/13/2013); Robot assisted laparoscopic radical prostatectomy (N/A, 02/21/2019); Lymphadenectomy (Bilateral, 02/21/2019); Extracorporeal shock wave lithotripsy (Right, 07/09/2020); Cystoscopy with retrograde pyelogram, ureteroscopy and stent placement (Right, 07/13/2020); Cystoscopy/ureteroscopy/holmium laser/stent placement (Right, 08/13/2020); Colonoscopy (2012); Extracorporeal shock wave lithotripsy (Left, 07/28/2022); and Rotator cuff repair (Left, 07/07/2023). Family History:  His family history is not on file.  REVIEW OF SYSTEMS  : All other systems reviewed and negative except where noted in the History of Present Illness.  PHYSICAL EXAM: BP 128/82   Pulse 83   Ht 5\' 11"  (1.803 m)   Wt 207 lb 2 oz (94 kg)   SpO2 94%   BMI 28.89 kg/m  General Appearance: Well nourished, in no apparent distress. Head:   Normocephalic and atraumatic. Eyes:  sclerae anicteric,conjunctive pink  Respiratory: Respiratory effort normal, BS equal bilaterally without rales, rhonchi, wheezing. Cardio: RRR with no MRGs. Peripheral pulses intact.  Abdomen: Soft,  Non-distended ,active bowel sounds. No tenderness . No masses. Rectal with anoscopy:  normal external rectal exam, mild  tenderness no masses appreciated, normal rectal tone, anoscopy showed posterior fissure with right posterior column slight hemorrhoid Musculoskeletal: Full ROM but left shoulder in sling, Normal gait. Without edema. Skin:  Dry and intact without significant lesions or rashes Neuro: Alert and  oriented x4;  No focal deficits. Psych:  Cooperative. Normal mood and affect.    Doree Albee, PA-C 10:04 AM

## 2023-08-14 NOTE — Patient Instructions (Signed)
Miralax is an osmotic laxative.  It only brings more water into the stool.  This is safe to take daily.  Can take up to 17 gram of miralax twice a day.  Mix with juice or coffee.  Start 1 capful at night for 3-4 days and reassess your response in 3-4 days.  You can increase and decrease the dose based on your response.  Remember, it can take up to 3-4 days to take effect OR for the effects to wear off.   I often pair this with benefiber in the morning to help assure the stool is not too loose.   Toileting tips to help with your constipation - Drink at least 64-80 ounces of water/liquid per day. - Establish a time to try to move your bowels every day.  For many people, this is after a cup of coffee or after a meal such as breakfast. - Sit all of the way back on the toilet keeping your back fairly straight and while sitting up, try to rest the tops of your forearms on your upper thighs.   - Raising your feet with a step stool/squatty potty can be helpful to improve the angle that allows your stool to pass through the rectum. - Relax the rectum feeling it bulge toward the toilet water.  If you feel your rectum raising toward your body, you are contracting rather than relaxing. - Breathe in and slowly exhale. "Belly breath" by expanding your belly towards your belly button. Keep belly expanded as you gently direct pressure down and back to the anus.  A low pitched GRRR sound can assist with increasing intra-abdominal pressure.  (Can also trying to blow on a pinwheel and make it move, this helps with the same belly breathing) - Repeat 3-4 times. If unsuccessful, contract the pelvic floor to restore normal tone and get off the toilet.  Avoid excessive straining. - To reduce excessive wiping by teaching your anus to normally contract, place hands on outer aspect of knees and resist knee movement outward.  Hold 5-10 second then place hands just inside of knees and resist inward movement of knees.  Hold 5  seconds.  Repeat a few times each way.  Go to the ER if unable to pass gas, severe AB pain, unable to hold down food, any shortness of breath of chest pain.   Patient Drug Education for Nitroglycerin Ointment  Nitroglycerin ointment (NTG) is used to help heal anal fissures. The ointment relaxes the smooth muscle around the anus and promotes blood flow which helps heal the fissure (tear). The NTG reduces anal canal pressure, which diminishes pain and spasm.   The NTG ointment should be applied 3 times per day, or as directed.  A pea-sized drop should be placed on the tip of your finger and then gently placed inside the anus. The finger should be inserted 1/3 - 1/2 its length and may be covered with a plastic glove or finger cot. You may use Vaseline to help coat the finger or dilute the ointment. If you are advised to mix the NTG with steroid ointment, limit the steroids to one to two weeks.  The first few applications should be taken lying down, as mild light-headedness or a brief headache may occur.  It may take several weeks for the fissure to begin healing, and you will need to continue taking the medication after resolution of your symptoms.  It is important to continue the treatment for the entire time period - up  to 3 months or as directed. It takes up to two years for the healing tissue to regain the normal skin strength. You will be advised to add fiber to your diet, increase water intake to 7-8 glasses per day, take relaxing baths or sitz baths, and avoid prolonged sitting and straining on the commode. Local anesthetic ointment may be added.  Initially, the anal fissure is very inflamed, which allows more of the NTG to get into the blood. This allows for a higher incidence of the most common side effect - a headache. It is usually brief and mild, but may require Tylenol or Advil. You may dilute the NTG further with Vaseline to decrease the headaches. As the treatment progresses and the  fissure begins to heal, the headaches will tend to dissipate. Other side effects include lightheadedness, flushing, dizziness, nervousness, nausea, and vomiting. If any of these side effects persist or worsen, notify us promptly. Stop using the NTG and notify us immediately if you develop the rare side effects of severe dizziness, fainting, fast/pounding heartbeat, paleness, sweating, blurred vision, dry mouth, dark urine, bluish lips/skin/nails, unusual tiredness, severe weakness, irregular heartbeat, seizures, or chest pain. Serious allergic reactions are unusual, but seek immediate medical attention if you develop a rash, swelling, dizziness, or trouble breathing.  Tell us if you are allergic to nitrates, have severe anemia, low blood pressure, dehydration, chronic heart failure, cardiomyopathy, recent heart attack, increased pressure in the brain, or exposure to nitrates while on the job. Do not use NTG while driving or working around machinery if you are drowsy, dizzy, have lightheadedness, or blurred vision. Limit alcoholic beverages. To minimize dizziness and lightheadedness, get up slowly when rising from a sitting or lying position. The elderly may be more prone to dizziness and falling. While there are not adequate studies to confirm the safety of NTG in pregnant or breast feeding women, it has been used without incident so far. We recommend waiting at least one hour after applying the NTG ointment before breast feeding.  Do not use NTG ointment if you are taking drugs for sexual problems [e.g., sildenafil (Viagra), tadalafil (Cialis), vardenafil (Levitra)]. Use caution before taking cough-and-cold products, diet aids, or NSAIDs preparations because they may contain ingredients that could increase your blood pressure, cause a fast heartbeat, or increase chest pain (e.g., pseudoephedrine, phenylephrine, chlorpheniramine, diphenhydramine, clemastine, ibuprofen, and naproxen). Tell us if you drink  alcohol, take alteplase, migraine drugs (ergotamine), water pills/diuretics such as furosemide or hydrochlorothiazide, or other drugs for high blood pressure (beta blockers, calcium channel blockers, ACE inhibitors).  Store the NTG at room temperature and keep away from light and moisture. Close the container tightly after each use. Do not store in the bathroom. Keep away from children and pets.

## 2023-08-16 NOTE — Progress Notes (Signed)
Agree with the assessment and plan as outlined by Quentin Mulling, PA-C.  If he continues to have headaches with the topical NTG, can change to topical nifedipine 0.2-0.3% (although the CCB also carries an ADR of headache in 5-12%).   Arilynn Blakeney, DO, [facility]

## 2023-08-17 DIAGNOSIS — Z4789 Encounter for other orthopedic aftercare: Secondary | ICD-10-CM | POA: Diagnosis not present

## 2023-08-17 DIAGNOSIS — M75102 Unspecified rotator cuff tear or rupture of left shoulder, not specified as traumatic: Secondary | ICD-10-CM | POA: Diagnosis not present

## 2023-08-17 DIAGNOSIS — M7542 Impingement syndrome of left shoulder: Secondary | ICD-10-CM | POA: Diagnosis not present

## 2023-08-17 DIAGNOSIS — M25512 Pain in left shoulder: Secondary | ICD-10-CM | POA: Diagnosis not present

## 2023-08-22 DIAGNOSIS — M7542 Impingement syndrome of left shoulder: Secondary | ICD-10-CM | POA: Diagnosis not present

## 2023-08-22 DIAGNOSIS — M25512 Pain in left shoulder: Secondary | ICD-10-CM | POA: Diagnosis not present

## 2023-08-22 DIAGNOSIS — Z4789 Encounter for other orthopedic aftercare: Secondary | ICD-10-CM | POA: Diagnosis not present

## 2023-08-22 DIAGNOSIS — M75102 Unspecified rotator cuff tear or rupture of left shoulder, not specified as traumatic: Secondary | ICD-10-CM | POA: Diagnosis not present

## 2023-08-24 DIAGNOSIS — M75102 Unspecified rotator cuff tear or rupture of left shoulder, not specified as traumatic: Secondary | ICD-10-CM | POA: Diagnosis not present

## 2023-08-24 DIAGNOSIS — M7542 Impingement syndrome of left shoulder: Secondary | ICD-10-CM | POA: Diagnosis not present

## 2023-08-24 DIAGNOSIS — M25512 Pain in left shoulder: Secondary | ICD-10-CM | POA: Diagnosis not present

## 2023-08-24 DIAGNOSIS — Z4789 Encounter for other orthopedic aftercare: Secondary | ICD-10-CM | POA: Diagnosis not present

## 2023-08-29 DIAGNOSIS — M75102 Unspecified rotator cuff tear or rupture of left shoulder, not specified as traumatic: Secondary | ICD-10-CM | POA: Diagnosis not present

## 2023-08-29 DIAGNOSIS — M7542 Impingement syndrome of left shoulder: Secondary | ICD-10-CM | POA: Diagnosis not present

## 2023-08-29 DIAGNOSIS — Z4789 Encounter for other orthopedic aftercare: Secondary | ICD-10-CM | POA: Diagnosis not present

## 2023-08-29 DIAGNOSIS — M25512 Pain in left shoulder: Secondary | ICD-10-CM | POA: Diagnosis not present

## 2023-08-31 DIAGNOSIS — M75102 Unspecified rotator cuff tear or rupture of left shoulder, not specified as traumatic: Secondary | ICD-10-CM | POA: Diagnosis not present

## 2023-08-31 DIAGNOSIS — M25512 Pain in left shoulder: Secondary | ICD-10-CM | POA: Diagnosis not present

## 2023-08-31 DIAGNOSIS — M7542 Impingement syndrome of left shoulder: Secondary | ICD-10-CM | POA: Diagnosis not present

## 2023-08-31 DIAGNOSIS — Z4789 Encounter for other orthopedic aftercare: Secondary | ICD-10-CM | POA: Diagnosis not present

## 2023-09-05 DIAGNOSIS — M7542 Impingement syndrome of left shoulder: Secondary | ICD-10-CM | POA: Diagnosis not present

## 2023-09-05 DIAGNOSIS — Z4789 Encounter for other orthopedic aftercare: Secondary | ICD-10-CM | POA: Diagnosis not present

## 2023-09-05 DIAGNOSIS — M75102 Unspecified rotator cuff tear or rupture of left shoulder, not specified as traumatic: Secondary | ICD-10-CM | POA: Diagnosis not present

## 2023-09-05 DIAGNOSIS — M25512 Pain in left shoulder: Secondary | ICD-10-CM | POA: Diagnosis not present

## 2023-09-07 DIAGNOSIS — M25512 Pain in left shoulder: Secondary | ICD-10-CM | POA: Diagnosis not present

## 2023-09-07 DIAGNOSIS — M75102 Unspecified rotator cuff tear or rupture of left shoulder, not specified as traumatic: Secondary | ICD-10-CM | POA: Diagnosis not present

## 2023-09-07 DIAGNOSIS — Z4789 Encounter for other orthopedic aftercare: Secondary | ICD-10-CM | POA: Diagnosis not present

## 2023-09-07 DIAGNOSIS — M7542 Impingement syndrome of left shoulder: Secondary | ICD-10-CM | POA: Diagnosis not present

## 2023-09-12 DIAGNOSIS — M75102 Unspecified rotator cuff tear or rupture of left shoulder, not specified as traumatic: Secondary | ICD-10-CM | POA: Diagnosis not present

## 2023-09-12 DIAGNOSIS — M7542 Impingement syndrome of left shoulder: Secondary | ICD-10-CM | POA: Diagnosis not present

## 2023-09-12 DIAGNOSIS — Z4789 Encounter for other orthopedic aftercare: Secondary | ICD-10-CM | POA: Diagnosis not present

## 2023-09-12 DIAGNOSIS — M25512 Pain in left shoulder: Secondary | ICD-10-CM | POA: Diagnosis not present

## 2023-09-25 ENCOUNTER — Ambulatory Visit: Payer: Federal, State, Local not specified - PPO | Admitting: Family Medicine

## 2023-09-26 ENCOUNTER — Ambulatory Visit (INDEPENDENT_AMBULATORY_CARE_PROVIDER_SITE_OTHER): Admitting: Internal Medicine

## 2023-09-26 ENCOUNTER — Encounter: Payer: Self-pay | Admitting: Internal Medicine

## 2023-09-26 VITALS — BP 126/70 | HR 76 | Temp 97.6°F | Wt 201.7 lb

## 2023-09-26 DIAGNOSIS — R030 Elevated blood-pressure reading, without diagnosis of hypertension: Secondary | ICD-10-CM

## 2023-09-26 DIAGNOSIS — Z23 Encounter for immunization: Secondary | ICD-10-CM | POA: Diagnosis not present

## 2023-09-26 NOTE — Addendum Note (Signed)
 Addended by: Kern Reap B on: 09/26/2023 04:08 PM   Modules accepted: Orders

## 2023-09-26 NOTE — Progress Notes (Signed)
 Established Patient Office Visit     CC/Reason for Visit: Elevated home blood pressure measurements  HPI: Adrian [name] is a 63 y.o. male who is coming in today for the above mentioned reasons.  Has been noticing elevated blood pressure in the range of 150 systolic to 80-90 diastolic.  He has otherwise been feeling well.   Past Medical/Surgical History: Past Medical History:  Diagnosis Date   Cancer [facility])    prostate   Dental crowns present    History of kidney stones    Plantar fasciitis of left and right  foot 05/2013    Past Surgical History:  Procedure Laterality Date   BREAST ENHANCEMENT SURGERY Left    asymmetry of chest wall   CLOSED REDUCTION METACARPAL WITH PERCUTANEOUS PINNING Right 12/22/2004   5th metacarpal   COLONOSCOPY  2012   CYSTOSCOPY WITH RETROGRADE PYELOGRAM, URETEROSCOPY AND STENT PLACEMENT Right 07/13/2020   Procedure: CYSTOSCOPY WITH RETROGRADE PYELOGRAM, RIGHT URETERAL STENT PLACEMENT;  Surgeon: Devere Lonni Righter, MD;  Location: [facility];  Service: Urology;  Laterality: Right;   CYSTOSCOPY/URETEROSCOPY/HOLMIUM LASER/STENT PLACEMENT Right 08/13/2020   Procedure: CYSTOSCOPY/URETEROSCOPY/RETROGRADE/ REMOVAL OF STONE/STENT EXCHANGE;  Surgeon: Renda Glance, MD;  Location: [facility];  Service: Urology;  Laterality: Right;   EXTRACORPOREAL SHOCK WAVE LITHOTRIPSY Right 07/09/2020   Procedure: EXTRACORPOREAL SHOCK WAVE LITHOTRIPSY (ESWL);  Surgeon: Renda Glance, MD;  [facility];  Service: Urology;  Laterality: Right;   EXTRACORPOREAL SHOCK WAVE LITHOTRIPSY Left 07/28/2022   Procedure: LEFT EXTRACORPOREAL SHOCK WAVE LITHOTRIPSY (ESWL);  Surgeon: Nieves Cough, MD;  Location[facility];  Service: Urology;  Laterality: Left;   KNEE ARTHROSCOPY Left    LITHOTRIPSY     LYMPHADENECTOMY Bilateral 02/21/2019   Procedure: LYMPHADENECTOMY, PELVIC;  Surgeon: Renda Glance, MD;  Location: [facility];  Service: Urology;   Laterality: Bilateral;   MANDIBLE SURGERY     PLANTAR FASCIA RELEASE  06/28/2012   Procedure: ENDOSCOPIC PLANTAR FASCIOTOMY;  Surgeon: Toribio JULIANNA Chancy, MD;  Location: [facility];  Service: Orthopedics;  Laterality: Right;   PLANTAR FASCIA RELEASE Left 06/13/2013   Procedure: ENDOSCOPIC PLANTAR FASCIOTOMY;  Surgeon: Toribio JULIANNA Chancy, MD;  Location: [facility];  Service: Orthopedics;  Laterality: Left;   ROBOT ASSISTED LAPAROSCOPIC RADICAL PROSTATECTOMY N/A 02/21/2019   Procedure: XI ROBOTIC ASSISTED LAPAROSCOPIC RADICAL PROSTATECTOMY LEVEL 2;  Surgeon: Renda Glance, MD;  Location[facility];  Service: Urology;  Laterality: N/A;   ROTATOR CUFF REPAIR Left 07/07/2023    Social History:  reports that he has never smoked. He has never used smokeless tobacco. He reports that he does not drink alcohol and does not use drugs.  Allergies: Allergies  Allergen Reactions   Oxycodone  Nausea And Vomiting   Hydrocodone  Nausea And Vomiting   Methocarbamol Nausea Only    Family History:  Family History  Problem Relation Age of Onset   Colon cancer Neg Hx    Colon polyps Neg Hx    Esophageal cancer Neg Hx    Rectal cancer Neg Hx    Stomach cancer Neg Hx      Current Outpatient Medications:    AMBULATORY NON FORMULARY MEDICATION, Medication Name: Nitroglycerin 0.125% Apply pea sized amount inside rectum twice daily x 6 weeks, Disp: 30 g, Rfl: 0  Review of Systems:  Negative unless indicated in HPI.   Physical Exam: Vitals:   09/26/23 1120  BP: 112/68  Pulse: 76  Temp: 97.6 F (36.4 C)  TempSrc:  Oral  SpO2: 98%  Weight: 201 lb 11.2 oz (91.5 kg)    Body mass index is 28.13 kg/m.   Physical Exam Vitals reviewed.  Constitutional:      Appearance: Normal appearance.  HENT:     Head: Normocephalic and atraumatic.  Eyes:     Conjunctiva/sclera: Conjunctivae normal.  Skin:    General: Skin is warm and dry.  Neurological:     General: No focal  deficit present.     Mental Status: He is alert and oriented to person, place, and time.  Psychiatric:        Mood and Affect: Mood normal.        Behavior: Behavior normal.        Thought Content: Thought content normal.        Judgment: Judgment normal.      Impression and Plan:  Elevated BP without diagnosis of hypertension  -BP in office normal on 2 separate measurements. -Will continue ambulatory BP measurements and will return if continues to have issues.   Time spent:22 minutes reviewing chart, interviewing and examining patient and formulating plan of care.     Tully Theophilus Andrews, MD [facility]

## 2023-10-17 ENCOUNTER — Encounter: Payer: Self-pay | Admitting: Internal Medicine

## 2023-10-17 ENCOUNTER — Ambulatory Visit (INDEPENDENT_AMBULATORY_CARE_PROVIDER_SITE_OTHER): Admitting: Internal Medicine

## 2023-10-17 VITALS — BP 141/82 | HR 79 | Temp 97.7°F | Wt 202.9 lb

## 2023-10-17 DIAGNOSIS — I10 Essential (primary) hypertension: Secondary | ICD-10-CM | POA: Insufficient documentation

## 2023-10-17 MED ORDER — AMLODIPINE BESYLATE 5 MG PO TABS
5.0000 mg | ORAL_TABLET | Freq: Every day | ORAL | 1 refills | Status: DC
Start: 2023-10-17 — End: 2024-01-22

## 2023-10-17 NOTE — Progress Notes (Signed)
 Established Patient Office Visit     CC/Reason for Visit: Concerns about blood pressure  HPI: Adrian [name] is a 63 y.o. male who is coming in today for the above mentioned reasons.  He continues to have concerns about his blood pressure.  Home measurements are systolics from 140s to 160s and diastolics in the 90s to 100s.  He is here today for evaluation.  He feels well otherwise.   Past Medical/Surgical History: Past Medical History:  Diagnosis Date   Cancer [facility])    prostate   Dental crowns present    History of kidney stones    Plantar fasciitis of left and right  foot 05/2013    Past Surgical History:  Procedure Laterality Date   BREAST ENHANCEMENT SURGERY Left    asymmetry of chest wall   CLOSED REDUCTION METACARPAL WITH PERCUTANEOUS PINNING Right 12/22/2004   5th metacarpal   COLONOSCOPY  2012   CYSTOSCOPY WITH RETROGRADE PYELOGRAM, URETEROSCOPY AND STENT PLACEMENT Right 07/13/2020   Procedure: CYSTOSCOPY WITH RETROGRADE PYELOGRAM, RIGHT URETERAL STENT PLACEMENT;  Surgeon: Devere Lonni Righter, MD;  [facility];  Service: Urology;  Laterality: Right;   CYSTOSCOPY/URETEROSCOPY/HOLMIUM LASER/STENT PLACEMENT Right 08/13/2020   Procedure: CYSTOSCOPY/URETEROSCOPY/RETROGRADE/ REMOVAL OF STONE/STENT EXCHANGE;  Surgeon: Renda Glance, MD;  [facility];  Service: Urology;  Laterality: Right;   EXTRACORPOREAL SHOCK WAVE LITHOTRIPSY Right 07/09/2020   Procedure: EXTRACORPOREAL SHOCK WAVE LITHOTRIPSY (ESWL);  Surgeon: Renda Glance, MD;  Location: [facility];  Service: Urology;  Laterality: Right;   EXTRACORPOREAL SHOCK WAVE LITHOTRIPSY Left 07/28/2022   Procedure: LEFT EXTRACORPOREAL SHOCK WAVE LITHOTRIPSY (ESWL);  Surgeon: Nieves Cough, MD;  Location: [facility];  Service: Urology;  Laterality: Left;   KNEE ARTHROSCOPY Left    LITHOTRIPSY     LYMPHADENECTOMY Bilateral 02/21/2019   Procedure: LYMPHADENECTOMY, PELVIC;   Surgeon: Renda Glance, MD;  [facility];  Service: Urology;  Laterality: Bilateral;   MANDIBLE SURGERY     PLANTAR FASCIA RELEASE  06/28/2012   Procedure: ENDOSCOPIC PLANTAR FASCIOTOMY;  Surgeon: Toribio JULIANNA Chancy, MD;  Location: [facility];  Service: Orthopedics;  Laterality: Right;   PLANTAR FASCIA RELEASE Left 06/13/2013   Procedure: ENDOSCOPIC PLANTAR FASCIOTOMY;  Surgeon: Toribio JULIANNA Chancy, MD;  Location: [facility];  Service: Orthopedics;  Laterality: Left;   ROBOT ASSISTED LAPAROSCOPIC RADICAL PROSTATECTOMY N/A 02/21/2019   Procedure: XI ROBOTIC ASSISTED LAPAROSCOPIC RADICAL PROSTATECTOMY LEVEL 2;  Surgeon: Renda Glance, MD;  [facility];  Service: Urology;  Laterality: N/A;   ROTATOR CUFF REPAIR Left 07/07/2023    Social History:  reports that he has never smoked. He has never used smokeless tobacco. He reports that he does not drink alcohol and does not use drugs.  Allergies: Allergies  Allergen Reactions   Oxycodone  Nausea And Vomiting   Hydrocodone  Nausea And Vomiting   Methocarbamol Nausea Only    Family History:  Family History  Problem Relation Age of Onset   Colon cancer Neg Hx    Colon polyps Neg Hx    Esophageal cancer Neg Hx    Rectal cancer Neg Hx    Stomach cancer Neg Hx      Current Outpatient Medications:    AMBULATORY NON FORMULARY MEDICATION, Medication Name: Nitroglycerin 0.125% Apply pea sized amount inside rectum twice daily x 6 weeks, Disp: 30 g, Rfl: 0   amLODipine  (NORVASC ) 5 MG tablet, Take 1 tablet (5 mg total) by mouth daily., Disp: 90 tablet,  Rfl: 1  Review of Systems:  Negative unless indicated in HPI.   Physical Exam: Vitals:   10/17/23 1416 10/17/23 1420 10/17/23 1456 10/17/23 1457  BP: (!) 102/90 (!) 127/95 130/85 (!) 141/82  Pulse: 79     Temp: 97.7 F (36.5 C)     TempSrc: Oral     SpO2: 97%     Weight: 202 lb 14.4 oz (92 kg)       Body mass index is 28.3 kg/m.   Physical  Exam Vitals reviewed.  Constitutional:      Appearance: Normal appearance.  HENT:     Head: Normocephalic and atraumatic.  Eyes:     Conjunctiva/sclera: Conjunctivae normal.     Pupils: Pupils are equal, round, and reactive to light.  Cardiovascular:     Rate and Rhythm: Normal rate and regular rhythm.  Pulmonary:     Effort: Pulmonary effort is normal.     Breath sounds: Normal breath sounds.  Skin:    General: Skin is warm and dry.  Neurological:     General: No focal deficit present.     Mental Status: He is alert and oriented to person, place, and time.  Psychiatric:        Mood and Affect: Mood normal.        Behavior: Behavior normal.        Thought Content: Thought content normal.        Judgment: Judgment normal.      Impression and Plan:  Primary hypertension -     amLODIPine  Besylate; Take 1 tablet (5 mg total) by mouth daily.  Dispense: 90 tablet; Refill: 1   -I will go ahead and make diagnosis of hypertension and start amlodipine  5 mg daily.  He will continue to do ambulatory blood pressure measurements and return in 6 to 8 weeks for follow-up.  Time spent:30 minutes reviewing chart, interviewing and examining patient and formulating plan of care.     Tully Theophilus Andrews, MD [facility]

## 2023-11-06 ENCOUNTER — Ambulatory Visit: Payer: Federal, State, Local not specified - PPO | Admitting: Physician Assistant

## 2023-11-06 NOTE — Progress Notes (Deleted)
 11/06/2023 Adrian [name] [id number] 10-Dec-1960  Referring provider: Philip Aspen, [place]* Primary GI doctor: Dr. Barron Alvine  ASSESSMENT AND PLAN:   Posterior anal fissure Sitz baths, high-fiber diet, add on benefiber.  Long discussion about preventing constipation discussed in detail for prevention, add miralax, continue walking. Continue NTG with gloved hand for another 4 weeks, can mix with vaseline to prevent HA's Follow up 2-3 months for secondary evaluation and possible surgical referral for repair if not improved.  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  Follow up 2-3 months  Screening colon  Recall 2030  Patient Care Team: Philip Aspen, Limmie Patricia, MD as PCP - General (Internal Medicine)  HISTORY OF PRESENT ILLNESS: 63 y.o. male with a past medical history of prostate cancer vitamin D deficiency history of kidney stones and others listed below presents for follow-up of rectal fissure  01/2011 colonoscopy sigmoid diverticulosis 09/16/2021 colonoscopy with Dr. Barron Alvine for routine screening excellent prep 2 polyps 2 to 4 mm transverse colon cecum, 6 mm polyp sigmoid colon diverticulosis sigmoid colon nonbleeding internal hemorrhoids.  Pathology showed 1 polyp tubular adenomatous the others were hyperplastic recall 7 years  07/30/2023 ER visit for constipation after shoulder surgery after taking antibiotics, steroids and pain medications had tried enema without relief, attempted disimpaction unsuccessful in the ER however good results with soapsuds enemas. 08/03/2023 patient called with rectal bleeding started MiraLAX fiber and given nitroglycerin for possible rectal fissure    He  reports that he has never smoked. He has never used smokeless tobacco. He reports that he does not drink alcohol and does not use drugs.  RELEVANT LABS AND  IMAGING:  CBC    Component Value Date/Time   WBC 6.3 05/11/2023 0953   RBC 5.47 05/11/2023 0953   HGB 15.9 05/11/2023 0953   HCT 48.0 05/11/2023 0953   PLT 251.0 05/11/2023 0953   MCV 87.9 05/11/2023 0953   MCH 28.8 05/07/2023 2128   MCHC 33.2 05/11/2023 0953   RDW 13.4 05/11/2023 0953   LYMPHSABS 2.1 05/11/2023 0953   MONOABS 0.6 05/11/2023 0953   EOSABS 0.2 05/11/2023 0953   BASOSABS 0.1 05/11/2023 0953   Recent Labs    05/07/23 2128 05/11/23 0953  HGB 14.4 15.9    CMP     Component Value Date/Time   NA 139 05/11/2023 0953   K 4.1 05/11/2023 0953   CL 104 05/11/2023 0953   CO2 27 05/11/2023 0953   GLUCOSE 84 05/11/2023 0953   BUN 22 05/11/2023 0953   CREATININE 1.20 05/11/2023 0953   CREATININE 1.29 (H) 08/27/2020 0909   CALCIUM 9.5 05/11/2023 0953   PROT 7.1 05/11/2023 0953   ALBUMIN 4.2 05/11/2023 0953   AST 16 05/11/2023 0953   ALT 16 05/11/2023 0953   ALKPHOS 85 05/11/2023 0953   BILITOT 0.5 05/11/2023 0953   GFRNONAA 57 (L) 05/07/2023 2128   GFRAA >60 02/18/2019 0939      Latest Ref Rng & Units 05/11/2023    9:53 AM 05/07/2023    9:28 PM 07/13/2021   10:28 AM  Hepatic Function  Total Protein 6.0 - 8.3 g/dL 7.1  6.0  7.1   Albumin 3.5 - 5.2 g/dL 4.2  3.3  4.3   AST 0 - 37 U/L 16  17  20    ALT 0 - 53 U/L 16  15  16  Alk Phosphatase 39 - 117 U/L 85  74  83   Total Bilirubin 0.2 - 1.2 mg/dL 0.5  0.5  0.5       Current Medications:    Current Outpatient Medications (Cardiovascular):    amLODipine (NORVASC) 5 MG tablet, Take 1 tablet (5 mg total) by mouth daily.     Current Outpatient Medications (Other):    AMBULATORY NON FORMULARY MEDICATION, Medication Name: Nitroglycerin 0.125% Apply pea sized amount inside rectum twice daily x 6 weeks  Medical History:  Past Medical History:  Diagnosis Date   Cancer [facility])    prostate   Dental crowns present    History of kidney stones    Plantar fasciitis of left and right  foot 05/2013    Allergies:  Allergies  Allergen Reactions   Oxycodone Nausea And Vomiting   Hydrocodone Nausea And Vomiting   Methocarbamol Nausea Only     Surgical History:  He  has a past surgical history that includes Knee arthroscopy (Left); Lithotripsy; Mandible surgery; Plantar fascia release (06/28/2012); Breast enhancement surgery (Left); Closed reduction metacarpal with percutaneous pinning (Right, 12/22/2004); Plantar fascia release (Left, 06/13/2013); Robot assisted laparoscopic radical prostatectomy (N/A, 02/21/2019); Lymphadenectomy (Bilateral, 02/21/2019); Extracorporeal shock wave lithotripsy (Right, 07/09/2020); Cystoscopy with retrograde pyelogram, ureteroscopy and stent placement (Right, 07/13/2020); Cystoscopy/ureteroscopy/holmium laser/stent placement (Right, 08/13/2020); Colonoscopy (2012); Extracorporeal shock wave lithotripsy (Left, 07/28/2022); and Rotator cuff repair (Left, 07/07/2023). Family History:  His family history is not on file.  REVIEW OF SYSTEMS  : All other systems reviewed and negative except where noted in the History of Present Illness.  PHYSICAL EXAM: There were no vitals taken for this visit. General Appearance: Well nourished, in no apparent distress. Head:   Normocephalic and atraumatic. Eyes:  sclerae anicteric,conjunctive pink  Respiratory: Respiratory effort normal, BS equal bilaterally without rales, rhonchi, wheezing. Cardio: RRR with no MRGs. Peripheral pulses intact.  Abdomen: Soft,  Non-distended ,active bowel sounds. No tenderness . No masses. Rectal with anoscopy:  normal external rectal exam, mild tenderness no masses appreciated, normal rectal tone, anoscopy showed posterior fissure with right posterior column slight hemorrhoid Musculoskeletal: Full ROM but left shoulder in sling, Normal gait. Without edema. Skin:  Dry and intact without significant lesions or rashes Neuro: Alert and  oriented x4;  No focal deficits. Psych:  Cooperative. Normal  mood and affect.    Doree Albee, PA-C 8:09 AM

## 2023-11-28 ENCOUNTER — Ambulatory Visit (INDEPENDENT_AMBULATORY_CARE_PROVIDER_SITE_OTHER): Admitting: Internal Medicine

## 2023-11-28 ENCOUNTER — Encounter: Payer: Self-pay | Admitting: Internal Medicine

## 2023-11-28 VITALS — BP 120/80 | HR 76 | Temp 97.5°F | Wt 214.5 lb

## 2023-11-28 DIAGNOSIS — I10 Essential (primary) hypertension: Secondary | ICD-10-CM

## 2023-11-28 NOTE — Progress Notes (Signed)
 Established Patient Office Visit     CC/Reason for Visit: BP follow up  HPI: Adrian [name] is a 63 y.o. male who is coming in today for the above mentioned reasons. Past Medical History is significant for: HTN recently diagnosed. Started on amlodipine 5 mg last visit. Her for f/u. Home BPs: 117/84, 128/85, 124/83.   Past Medical/Surgical History: Past Medical History:  Diagnosis Date   Cancer [facility])    prostate   Dental crowns present    History of kidney stones    Plantar fasciitis of left and right  foot 05/2013    Past Surgical History:  Procedure Laterality Date   BREAST ENHANCEMENT SURGERY Left    asymmetry of chest wall   CLOSED REDUCTION METACARPAL WITH PERCUTANEOUS PINNING Right 12/22/2004   5th metacarpal   COLONOSCOPY  2012   CYSTOSCOPY WITH RETROGRADE PYELOGRAM, URETEROSCOPY AND STENT PLACEMENT Right 07/13/2020   Procedure: CYSTOSCOPY WITH RETROGRADE PYELOGRAM, RIGHT URETERAL STENT PLACEMENT;  Surgeon: Rene Paci, MD;  [facility];  Service: Urology;  Laterality: Right;   CYSTOSCOPY/URETEROSCOPY/HOLMIUM LASER/STENT PLACEMENT Right 08/13/2020   Procedure: CYSTOSCOPY/URETEROSCOPY/RETROGRADE/ REMOVAL OF STONE/STENT EXCHANGE;  Surgeon: Heloise Purpura, MD;  [facility];  Service: Urology;  Laterality: Right;   EXTRACORPOREAL SHOCK WAVE LITHOTRIPSY Right 07/09/2020   Procedure: EXTRACORPOREAL SHOCK WAVE LITHOTRIPSY (ESWL);  Surgeon: Heloise Purpura, MD;  Location: [facility];  Service: Urology;  Laterality: Right;   EXTRACORPOREAL SHOCK WAVE LITHOTRIPSY Left 07/28/2022   Procedure: LEFT EXTRACORPOREAL SHOCK WAVE LITHOTRIPSY (ESWL);  Surgeon: Jerilee Field, MD;  Location: [facility];  Service: Urology;  Laterality: Left;   KNEE ARTHROSCOPY Left    LITHOTRIPSY     LYMPHADENECTOMY Bilateral 02/21/2019   Procedure: LYMPHADENECTOMY, PELVIC;  Surgeon: Heloise Purpura, MD;  [facility];  Service: Urology;   Laterality: Bilateral;   MANDIBLE SURGERY     PLANTAR FASCIA RELEASE  06/28/2012   Procedure: ENDOSCOPIC PLANTAR FASCIOTOMY;  Surgeon: Loreta Ave, MD;  Location: [facility];  Service: Orthopedics;  Laterality: Right;   PLANTAR FASCIA RELEASE Left 06/13/2013   Procedure: ENDOSCOPIC PLANTAR FASCIOTOMY;  Surgeon: Loreta Ave, MD;  Location: [facility];  Service: Orthopedics;  Laterality: Left;   ROBOT ASSISTED LAPAROSCOPIC RADICAL PROSTATECTOMY N/A 02/21/2019   Procedure: XI ROBOTIC ASSISTED LAPAROSCOPIC RADICAL PROSTATECTOMY LEVEL 2;  Surgeon: Heloise Purpura, MD;  [facility];  Service: Urology;  Laterality: N/A;   ROTATOR CUFF REPAIR Left 07/07/2023    Social History:  reports that he has never smoked. He has never used smokeless tobacco. He reports that he does not drink alcohol and does not use drugs.  Allergies: Allergies  Allergen Reactions   Oxycodone Nausea And Vomiting   Hydrocodone Nausea And Vomiting   Methocarbamol Nausea Only    Family History:  Family History  Problem Relation Age of Onset   Colon cancer Neg Hx    Colon polyps Neg Hx    Esophageal cancer Neg Hx    Rectal cancer Neg Hx    Stomach cancer Neg Hx      Current Outpatient Medications:    AMBULATORY NON FORMULARY MEDICATION, Medication Name: Nitroglycerin 0.125% Apply pea sized amount inside rectum twice daily x 6 weeks, Disp: 30 g, Rfl: 0   amLODipine (NORVASC) 5 MG tablet, Take 1 tablet (5 mg total) by mouth daily., Disp: 90 tablet, Rfl: 1  Review of Systems:  Negative unless indicated in HPI.   Physical  Exam: Vitals:   11/28/23 0952  BP: 120/80  Pulse: 76  Temp: (!) 97.5 F (36.4 C)  TempSrc: Oral  SpO2: 98%  Weight: 214 lb 8 oz (97.3 kg)    Body mass index is 29.92 kg/m.   Physical Exam Vitals reviewed.  Constitutional:      Appearance: Normal appearance.  HENT:     Head: Normocephalic and atraumatic.  Eyes:     Conjunctiva/sclera:  Conjunctivae normal.  Cardiovascular:     Rate and Rhythm: Normal rate and regular rhythm.  Pulmonary:     Effort: Pulmonary effort is normal.     Breath sounds: Normal breath sounds.  Skin:    General: Skin is warm and dry.  Neurological:     General: No focal deficit present.     Mental Status: He is alert and oriented to person, place, and time.  Psychiatric:        Mood and Affect: Mood normal.        Behavior: Behavior normal.        Thought Content: Thought content normal.        Judgment: Judgment normal.      Impression and Plan:  Primary hypertension   -Blood pressure is well-controlled on amlodipine 5 mg daily.  Will continue.  Time spent:22 minutes reviewing chart, interviewing and examining patient and formulating plan of care.     Chaya Jan, MD [facility]

## 2024-01-05 ENCOUNTER — Ambulatory Visit: Admitting: Family Medicine

## 2024-01-11 ENCOUNTER — Ambulatory Visit

## 2024-01-11 ENCOUNTER — Encounter: Payer: Self-pay | Admitting: Internal Medicine

## 2024-01-11 ENCOUNTER — Ambulatory Visit: Admitting: Internal Medicine

## 2024-01-11 VITALS — BP 110/80 | HR 60 | Temp 98.2°F | Wt 209.2 lb

## 2024-01-11 DIAGNOSIS — R053 Chronic cough: Secondary | ICD-10-CM | POA: Diagnosis not present

## 2024-01-11 NOTE — Progress Notes (Signed)
 Established Patient Office Visit     CC/Reason for Visit: Chronic cough  HPI: Adrian [name] is a 63 y.o. male who is coming in today for the above mentioned reasons. Past Medical History is significant for: Hypertension that is currently well-controlled.  Has been dealing with a dry, chronic cough for at least 5 months.  Is not on ACE inhibitor's or ARB's.  He has never had seasonal allergies and denies nasal congestion or postnasal drip.  Never smoker.   Past Medical/Surgical History: Past Medical History:  Diagnosis Date   Cancer [facility])    prostate   Dental crowns present    History of kidney stones    Plantar fasciitis of left and right  foot 05/2013    Past Surgical History:  Procedure Laterality Date   BREAST ENHANCEMENT SURGERY Left    asymmetry of chest wall   CLOSED REDUCTION METACARPAL WITH PERCUTANEOUS PINNING Right 12/22/2004   5th metacarpal   COLONOSCOPY  2012   CYSTOSCOPY WITH RETROGRADE PYELOGRAM, URETEROSCOPY AND STENT PLACEMENT Right 07/13/2020   Procedure: CYSTOSCOPY WITH RETROGRADE PYELOGRAM, RIGHT URETERAL STENT PLACEMENT;  Surgeon: Adelbert Homans, MD;  [facility];  Service: Urology;  Laterality: Right;   CYSTOSCOPY/URETEROSCOPY/HOLMIUM LASER/STENT PLACEMENT Right 08/13/2020   Procedure: CYSTOSCOPY/URETEROSCOPY/RETROGRADE/ REMOVAL OF STONE/STENT EXCHANGE;  Surgeon: Florencio Hunting, MD;  [facility];  Service: Urology;  Laterality: Right;   EXTRACORPOREAL SHOCK WAVE LITHOTRIPSY Right 07/09/2020   Procedure: EXTRACORPOREAL SHOCK WAVE LITHOTRIPSY (ESWL);  Surgeon: Florencio Hunting, MD;  Location: [facility];  Service: Urology;  Laterality: Right;   EXTRACORPOREAL SHOCK WAVE LITHOTRIPSY Left 07/28/2022   Procedure: LEFT EXTRACORPOREAL SHOCK WAVE LITHOTRIPSY (ESWL);  Surgeon: Christina Coyer, MD;  Location: [facility];  Service: Urology;  Laterality: Left;   KNEE ARTHROSCOPY Left    LITHOTRIPSY      LYMPHADENECTOMY Bilateral 02/21/2019   Procedure: LYMPHADENECTOMY, PELVIC;  Surgeon: Florencio Hunting, MD;  [facility];  Service: Urology;  Laterality: Bilateral;   MANDIBLE SURGERY     PLANTAR FASCIA RELEASE  06/28/2012   Procedure: ENDOSCOPIC PLANTAR FASCIOTOMY;  Surgeon: Ferd Householder, MD;  Location: [facility];  Service: Orthopedics;  Laterality: Right;   PLANTAR FASCIA RELEASE Left 06/13/2013   Procedure: ENDOSCOPIC PLANTAR FASCIOTOMY;  Surgeon: Ferd Householder, MD;  Location: [facility];  Service: Orthopedics;  Laterality: Left;   ROBOT ASSISTED LAPAROSCOPIC RADICAL PROSTATECTOMY N/A 02/21/2019   Procedure: XI ROBOTIC ASSISTED LAPAROSCOPIC RADICAL PROSTATECTOMY LEVEL 2;  Surgeon: Florencio Hunting, MD;  [facility];  Service: Urology;  Laterality: N/A;   ROTATOR CUFF REPAIR Left 07/07/2023    Social History:  reports that he has never smoked. He has never used smokeless tobacco. He reports that he does not drink alcohol and does not use drugs.  Allergies: Allergies  Allergen Reactions   Oxycodone  Nausea And Vomiting   Hydrocodone  Nausea And Vomiting   Methocarbamol Nausea Only    Family History:  Family History  Problem Relation Age of Onset   Colon cancer Neg Hx    Colon polyps Neg Hx    Esophageal cancer Neg Hx    Rectal cancer Neg Hx    Stomach cancer Neg Hx      Current Outpatient Medications:    amLODipine  (NORVASC ) 5 MG tablet, Take 1 tablet (5 mg total) by mouth daily., Disp: 90 tablet, Rfl: 1   AMBULATORY NON FORMULARY MEDICATION, Medication Name: Nitroglycerin 0.125% Apply pea sized amount  inside rectum twice daily x 6 weeks (Patient not taking: Reported on 01/11/2024), Disp: 30 g, Rfl: 0  Review of Systems:  Negative unless indicated in HPI.   Physical Exam: Vitals:   01/11/24 1015  BP: 110/80  Pulse: 60  Temp: 98.2 F (36.8 C)  TempSrc: Oral  SpO2: 98%  Weight: 209 lb 3.2 oz (94.9 kg)    Body mass index is  29.18 kg/m.   Physical Exam Vitals reviewed.  Constitutional:      Appearance: Normal appearance.  HENT:     Head: Normocephalic and atraumatic.  Eyes:     Conjunctiva/sclera: Conjunctivae normal.  Cardiovascular:     Rate and Rhythm: Normal rate and regular rhythm.  Pulmonary:     Effort: Pulmonary effort is normal.     Breath sounds: Normal breath sounds.  Skin:    General: Skin is warm and dry.  Neurological:     General: No focal deficit present.     Mental Status: He is alert and oriented to person, place, and time.  Psychiatric:        Mood and Affect: Mood normal.        Behavior: Behavior normal.        Thought Content: Thought content normal.        Judgment: Judgment normal.      Impression and Plan:  Chronic cough -     DG Chest 2 View; Future  -Never smoker, normal lung sounds.  Given chronicity of cough we will do chest x-ray for further evaluation.   Time spent:30 minutes reviewing chart, interviewing and examining patient and formulating plan of care.     Marguerita Shih, MD [facility] at [facility]

## 2024-01-15 ENCOUNTER — Encounter: Payer: Self-pay | Admitting: Internal Medicine

## 2024-01-19 ENCOUNTER — Encounter: Payer: Self-pay | Admitting: Internal Medicine

## 2024-01-22 ENCOUNTER — Other Ambulatory Visit: Payer: Self-pay | Admitting: Internal Medicine

## 2024-01-22 DIAGNOSIS — I10 Essential (primary) hypertension: Secondary | ICD-10-CM

## 2024-01-22 MED ORDER — AMLODIPINE BESYLATE 5 MG PO TABS
5.0000 mg | ORAL_TABLET | Freq: Every day | ORAL | 1 refills | Status: AC
Start: 2024-01-22 — End: ?

## 2024-03-21 ENCOUNTER — Encounter: Payer: Self-pay | Admitting: Internal Medicine

## 2024-03-21 DIAGNOSIS — R238 Other skin changes: Secondary | ICD-10-CM

## 2024-04-08 ENCOUNTER — Ambulatory Visit: Payer: Self-pay

## 2024-04-08 NOTE — Telephone Encounter (Signed)
  FYI Only or Action Required?: FYI only for provider.  Patient was last seen in primary care on 01/11/2024 by Theophilus Andrews, Tully GRADE, MD.  Called Nurse Triage reporting Palpitations.  Symptoms began a week ago.  Interventions attempted: Nothing.  Symptoms are: unchanged.  Triage Disposition: See PCP When Office is Open (Within 3 Days)  Patient/caregiver understands and will follow disposition?:   Message from Ameerah G sent at 04/08/2024 10:20 AM EDT  Heart palpitations was told to speak with NT [phone]   Reason for Disposition  [1] Palpitations AND [2] no improvement after using Care Advice  Answer Assessment - Initial Assessment Questions 1. DESCRIPTION: Please describe your heart rate or heartbeat that you are having (e.g., fast/slow, regular/irregular, skipped or extra beats, palpitations)  Last week had physical, PA stated that he was having palpitations every 10-12 beats, but recommended that it be checked out  7. RECURRENT SYMPTOM: Have you ever had this before? If Yes, ask: When was the last time? and What happened that time?     Patient has never noticed before 8. CAUSE: What do you think is causing the palpitations?     unknown 9. CARDIAC HISTORY: Do you have any history of heart disease? (e.g., heart attack, angina, bypass surgery, angioplasty, arrhythmia)      denies 10. OTHER SYMPTOMS: Do you have any other symptoms? (e.g., dizziness, chest pain, sweating, difficulty breathing)       denies  Protocols used: Heart Rate and Heartbeat Questions-A-AH

## 2024-04-08 NOTE — Telephone Encounter (Signed)
 Noted

## 2024-04-09 ENCOUNTER — Ambulatory Visit (INDEPENDENT_AMBULATORY_CARE_PROVIDER_SITE_OTHER): Admitting: Internal Medicine

## 2024-04-09 ENCOUNTER — Encounter: Payer: Self-pay | Admitting: Internal Medicine

## 2024-04-09 VITALS — BP 125/89 | HR 73 | Temp 97.9°F | Wt 209.8 lb

## 2024-04-09 DIAGNOSIS — I499 Cardiac arrhythmia, unspecified: Secondary | ICD-10-CM | POA: Diagnosis not present

## 2024-04-09 NOTE — Progress Notes (Signed)
 Established Patient Office Visit     CC/Reason for Visit: Irregular heartbeat  HPI: Adrian [name] is a 63 y.o. male who is coming in today for the above mentioned reasons.  As part of his job he is required to get a physical by their physician.  I do not have the reports but apparently he was told that he had an irregular heartbeat.  An EKG was not performed.  He has not had any chest pain, palpitations, lightheadedness or dizziness.   Past Medical/Surgical History: Past Medical History:  Diagnosis Date   Cancer [facility])    prostate   Dental crowns present    History of kidney stones    Plantar fasciitis of left and right  foot 05/2013    Past Surgical History:  Procedure Laterality Date   BREAST ENHANCEMENT SURGERY Left    asymmetry of chest wall   CLOSED REDUCTION METACARPAL WITH PERCUTANEOUS PINNING Right 12/22/2004   5th metacarpal   COLONOSCOPY  2012   CYSTOSCOPY WITH RETROGRADE PYELOGRAM, URETEROSCOPY AND STENT PLACEMENT Right 07/13/2020   Procedure: CYSTOSCOPY WITH RETROGRADE PYELOGRAM, RIGHT URETERAL STENT PLACEMENT;  Surgeon: Devere Lonni Righter, MD;  [facility];  Service: Urology;  Laterality: Right;   CYSTOSCOPY/URETEROSCOPY/HOLMIUM LASER/STENT PLACEMENT Right 08/13/2020   Procedure: CYSTOSCOPY/URETEROSCOPY/RETROGRADE/ REMOVAL OF STONE/STENT EXCHANGE;  Surgeon: Renda Glance, MD;  [facility];  Service: Urology;  Laterality: Right;   EXTRACORPOREAL SHOCK WAVE LITHOTRIPSY Right 07/09/2020   Procedure: EXTRACORPOREAL SHOCK WAVE LITHOTRIPSY (ESWL);  Surgeon: Renda Glance, MD;  Location: [facility];  Service: Urology;  Laterality: Right;   EXTRACORPOREAL SHOCK WAVE LITHOTRIPSY Left 07/28/2022   Procedure: LEFT EXTRACORPOREAL SHOCK WAVE LITHOTRIPSY (ESWL);  Surgeon: Nieves Cough, MD;  Location: [facility];  Service: Urology;  Laterality: Left;   KNEE ARTHROSCOPY Left    LITHOTRIPSY     LYMPHADENECTOMY Bilateral  02/21/2019   Procedure: LYMPHADENECTOMY, PELVIC;  Surgeon: Renda Glance, MD;  [facility];  Service: Urology;  Laterality: Bilateral;   MANDIBLE SURGERY     PLANTAR FASCIA RELEASE  06/28/2012   Procedure: ENDOSCOPIC PLANTAR FASCIOTOMY;  Surgeon: Toribio JULIANNA Chancy, MD;  Location: [facility];  Service: Orthopedics;  Laterality: Right;   PLANTAR FASCIA RELEASE Left 06/13/2013   Procedure: ENDOSCOPIC PLANTAR FASCIOTOMY;  Surgeon: Toribio JULIANNA Chancy, MD;  Location: [facility];  Service: Orthopedics;  Laterality: Left;   ROBOT ASSISTED LAPAROSCOPIC RADICAL PROSTATECTOMY N/A 02/21/2019   Procedure: XI ROBOTIC ASSISTED LAPAROSCOPIC RADICAL PROSTATECTOMY LEVEL 2;  Surgeon: Renda Glance, MD;  [facility];  Service: Urology;  Laterality: N/A;   ROTATOR CUFF REPAIR Left 07/07/2023    Social History:  reports that he has never smoked. He has never used smokeless tobacco. He reports that he does not drink alcohol and does not use drugs.  Allergies: Allergies  Allergen Reactions   Oxycodone  Nausea And Vomiting   Hydrocodone  Nausea And Vomiting   Methocarbamol Nausea Only    Family History:  Family History  Problem Relation Age of Onset   Colon cancer Neg Hx    Colon polyps Neg Hx    Esophageal cancer Neg Hx    Rectal cancer Neg Hx    Stomach cancer Neg Hx      Current Outpatient Medications:    amLODipine  (NORVASC ) 5 MG tablet, Take 1 tablet (5 mg total) by mouth daily., Disp: 90 tablet, Rfl: 1   AMBULATORY NON FORMULARY MEDICATION, Medication Name: Nitroglycerin 0.125% Apply pea  sized amount inside rectum twice daily x 6 weeks (Patient not taking: Reported on 01/11/2024), Disp: 30 g, Rfl: 0  Review of Systems:  Negative unless indicated in HPI.   Physical Exam: Vitals:   04/09/24 1512 04/09/24 1517  BP: (!) 140/90 125/89  Pulse: 73   Temp: 97.9 F (36.6 C)   TempSrc: Oral   SpO2: 95%   Weight: 209 lb 12.8 oz (95.2 kg)     Body mass index is  29.26 kg/m.   Physical Exam Vitals reviewed.  Constitutional:      Appearance: Normal appearance.  HENT:     Head: Normocephalic and atraumatic.  Cardiovascular:     Rate and Rhythm: Normal rate and regular rhythm.  Pulmonary:     Effort: Pulmonary effort is normal.     Breath sounds: Normal breath sounds.  Skin:    General: Skin is warm and dry.  Neurological:     General: No focal deficit present.     Mental Status: He is alert and oriented to person, place, and time.  Psychiatric:        Mood and Affect: Mood normal.        Behavior: Behavior normal.        Thought Content: Thought content normal.        Judgment: Judgment normal.      Impression and Plan:  Cardiac arrhythmia, unspecified cardiac arrhythmia type -     EKG 12-Lead   - EKG performed in office today shows normal sinus rhythm with no ischemic changes. - As he has no symptoms believe no follow-up is necessary at this point in time.  Time spent:30 minutes reviewing chart, interviewing and examining patient and formulating plan of care.     Tully Theophilus Andrews, MD [facility] at [facility]

## 2024-04-22 ENCOUNTER — Encounter: Payer: Self-pay | Admitting: Internal Medicine

## 2024-04-29 ENCOUNTER — Ambulatory Visit: Payer: Self-pay

## 2024-04-29 NOTE — Telephone Encounter (Signed)
 FYI Only or Action Required?: FYI only for provider.  Patient was last seen in primary care on 04/09/2024 by Theophilus Andrews, Tully GRADE, MD.  Called Nurse Triage reporting Hematuria.  Symptoms began today.  Interventions attempted: Nothing.  Symptoms are: stable.  Triage Disposition: See Physician Within 24 Hours  Patient/caregiver understands and will follow disposition?: Yes  Copied from CRM #[id number]. Topic: Clinical - Red Word Triage >> Apr 29, 2024  5:42 PM Adrian [name] wrote: Red Word that prompted transfer to Nurse Triage: Blood in urine. Patient has a history of stones. Reason for Disposition  Blood in urine  (Exception: Could be normal menstrual bleeding.)  Answer Assessment - Initial Assessment Questions Hx of kidney stones.    1. COLOR of URINE: Describe the color of the urine.  (e.g., tea-colored, pink, red, bloody) Do you have blood clots in your urine? (e.g., none, pea, grape, small coin)     Dull red 2. ONSET: When did the bleeding start?      Today 3. EPISODES: How many times has there been blood in the urine? or How many times today?     1 time 4. PAIN with URINATION: Is there any pain with passing your urine? If Yes, ask: How bad is the pain?  (Scale 1-10; or mild, moderate, severe)     No 5. FEVER: Do you have a fever? If Yes, ask: What is your temperature, how was it measured, and when did it start?     No 6. ASSOCIATED SYMPTOMS: Are you passing urine more frequently than usual?     No 7. OTHER SYMPTOMS: Do you have any other symptoms? (e.g., back/flank pain, abdomen pain, vomiting)     No  Protocols used: Urine - Blood In-A-AH

## 2024-04-30 ENCOUNTER — Encounter: Payer: Self-pay | Admitting: Family Medicine

## 2024-04-30 ENCOUNTER — Ambulatory Visit: Payer: Self-pay | Admitting: Family Medicine

## 2024-04-30 ENCOUNTER — Ambulatory Visit (INDEPENDENT_AMBULATORY_CARE_PROVIDER_SITE_OTHER): Admitting: Family Medicine

## 2024-04-30 VITALS — BP 120/76 | HR 69 | Temp 97.8°F | Wt 209.3 lb

## 2024-04-30 DIAGNOSIS — R319 Hematuria, unspecified: Secondary | ICD-10-CM

## 2024-04-30 LAB — POC URINALSYSI DIPSTICK (AUTOMATED)
Bilirubin, UA: NEGATIVE
Blood, UA: POSITIVE
Glucose, UA: NEGATIVE
Ketones, UA: NEGATIVE
Leukocytes, UA: NEGATIVE
Nitrite, UA: NEGATIVE
Protein, UA: NEGATIVE
Spec Grav, UA: 1.02 (ref 1.010–1.025)
Urobilinogen, UA: 0.2 U/dL
pH, UA: 6 (ref 5.0–8.0)

## 2024-04-30 LAB — URINALYSIS, ROUTINE W REFLEX MICROSCOPIC
Bilirubin Urine: NEGATIVE
Ketones, ur: NEGATIVE
Leukocytes,Ua: NEGATIVE
Nitrite: NEGATIVE
Specific Gravity, Urine: 1.02 (ref 1.000–1.030)
Total Protein, Urine: NEGATIVE
Urine Glucose: NEGATIVE
Urobilinogen, UA: 0.2 (ref 0.0–1.0)
pH: 6.5 (ref 5.0–8.0)

## 2024-04-30 NOTE — Progress Notes (Signed)
 Established Patient Office Visit  Subjective   Patient ID: Adrian [name], male    DOB: 1961-02-24  Age: 63 y.o. MRN: [id number]  Chief Complaint  Patient presents with   Hematuria    HPI   Adrian [name] is seen with episode yesterday of gross hematuria.  He does have a longstanding history of bilateral kidney stones but denies any recent pain with urination or flank pain.  He also has history of prostatectomy about 5 years ago for prostate cancer.  His last kidney stone imaging was 2023 with bilateral stones.  Denies any recent penile discharge or burning with urination.  No fevers or chills.  Has not noted any hematuria today.  Does not take any blood thinners.  He has hypertension controlled with amlodipine .  No recent appetite or weight changes.  Past Medical History:  Diagnosis Date   Cancer [facility])    prostate   Dental crowns present    History of kidney stones    Plantar fasciitis of left and right  foot 05/2013   Past Surgical History:  Procedure Laterality Date   BREAST ENHANCEMENT SURGERY Left    asymmetry of chest wall   CLOSED REDUCTION METACARPAL WITH PERCUTANEOUS PINNING Right 12/22/2004   5th metacarpal   COLONOSCOPY  2012   CYSTOSCOPY WITH RETROGRADE PYELOGRAM, URETEROSCOPY AND STENT PLACEMENT Right 07/13/2020   Procedure: CYSTOSCOPY WITH RETROGRADE PYELOGRAM, RIGHT URETERAL STENT PLACEMENT;  Surgeon: Devere Lonni Righter, MD;  [facility];  Service: Urology;  Laterality: Right;   CYSTOSCOPY/URETEROSCOPY/HOLMIUM LASER/STENT PLACEMENT Right 08/13/2020   Procedure: CYSTOSCOPY/URETEROSCOPY/RETROGRADE/ REMOVAL OF STONE/STENT EXCHANGE;  Surgeon: Renda Glance, MD;  [facility];  Service: Urology;  Laterality: Right;   EXTRACORPOREAL SHOCK WAVE LITHOTRIPSY Right 07/09/2020   Procedure: EXTRACORPOREAL SHOCK WAVE LITHOTRIPSY (ESWL);  Surgeon: Renda Glance, MD;  Location: [facility];  Service: Urology;  Laterality: Right;   EXTRACORPOREAL SHOCK WAVE  LITHOTRIPSY Left 07/28/2022   Procedure: LEFT EXTRACORPOREAL SHOCK WAVE LITHOTRIPSY (ESWL);  Surgeon: Nieves Cough, MD;  Location: [facility];  Service: Urology;  Laterality: Left;   KNEE ARTHROSCOPY Left    LITHOTRIPSY     LYMPHADENECTOMY Bilateral 02/21/2019   Procedure: LYMPHADENECTOMY, PELVIC;  Surgeon: Renda Glance, MD;  [facility];  Service: Urology;  Laterality: Bilateral;   MANDIBLE SURGERY     PLANTAR FASCIA RELEASE  06/28/2012   Procedure: ENDOSCOPIC PLANTAR FASCIOTOMY;  Surgeon: Toribio JULIANNA Chancy, MD;  Location: [facility];  Service: Orthopedics;  Laterality: Right;   PLANTAR FASCIA RELEASE Left 06/13/2013   Procedure: ENDOSCOPIC PLANTAR FASCIOTOMY;  Surgeon: Toribio JULIANNA Chancy, MD;  Location: [facility];  Service: Orthopedics;  Laterality: Left;   ROBOT ASSISTED LAPAROSCOPIC RADICAL PROSTATECTOMY N/A 02/21/2019   Procedure: XI ROBOTIC ASSISTED LAPAROSCOPIC RADICAL PROSTATECTOMY LEVEL 2;  Surgeon: Renda Glance, MD;  [facility];  Service: Urology;  Laterality: N/A;   ROTATOR CUFF REPAIR Left 07/07/2023    reports that he has never smoked. He has never used smokeless tobacco. He reports that he does not drink alcohol and does not use drugs. family history is not on file. Allergies  Allergen Reactions   Oxycodone  Nausea And Vomiting   Hydrocodone  Nausea And Vomiting   Methocarbamol Nausea Only    Review of Systems  Constitutional:  Negative for chills and fever.  Genitourinary:  Positive for hematuria. Negative for dysuria, flank pain, frequency and urgency.      Objective:     BP 120/76  Pulse 69   Temp 97.8 F (36.6 C) (Oral)   Wt 209 lb 4.8 oz (94.9 kg)   SpO2 97%   BMI 29.19 kg/m    Physical Exam Vitals reviewed.  Constitutional:      General: He is not in acute distress.    Appearance: He is not ill-appearing.  Cardiovascular:     Rate and Rhythm: Normal rate and regular rhythm.  Pulmonary:      Effort: Pulmonary effort is normal.     Breath sounds: Normal breath sounds. No wheezing or rales.  Neurological:     Mental Status: He is alert.      Results for orders placed or performed in visit on 04/30/24  POCT Urinalysis Dipstick (Automated)  Result Value Ref Range   Color, UA Yellow    Clarity, UA Clear    Glucose, UA Negative Negative   Bilirubin, UA Negative    Ketones, UA Negative    Spec Grav, UA 1.020 1.010 - 1.025   Blood, UA Positive    pH, UA 6.0 5.0 - 8.0   Protein, UA Negative Negative   Urobilinogen, UA 0.2 0.2 or 1.0 E.U./dL   Nitrite, UA Negative    Leukocytes, UA Negative Negative      The 10-year ASCVD risk score (Arnett DK, et al., 2019) is: 10.5%    Assessment & Plan:   Problem List Items Addressed This Visit   None Visit Diagnoses       Hematuria, unspecified type    -  Primary   Relevant Orders   POCT Urinalysis Dipstick (Automated) (Completed)   Urinalysis with Reflex Microscopic   Ambulatory referral to Urology     Patient presents with 1 day history of painless hematuria.  Past history of kidney stones.  No dysuria, fever, or chills.  Urine dipstick confirms positive blood but no leukocytes or nitrites.  Will send urine for microscopy.  Set up urology referral for further evaluation of painless hematuria  No follow-ups on file.    Adrian Scarlet, MD

## 2024-04-30 NOTE — Telephone Encounter (Signed)
 noted

## 2024-05-06 ENCOUNTER — Encounter: Admitting: Internal Medicine

## 2024-06-11 ENCOUNTER — Ambulatory Visit: Admitting: Internal Medicine

## 2024-06-11 ENCOUNTER — Encounter: Payer: Self-pay | Admitting: Internal Medicine

## 2024-06-11 VITALS — BP 110/84 | HR 83 | Temp 97.9°F | Wt 209.7 lb

## 2024-06-11 DIAGNOSIS — R053 Chronic cough: Secondary | ICD-10-CM

## 2024-06-11 DIAGNOSIS — R0982 Postnasal drip: Secondary | ICD-10-CM

## 2024-06-11 DIAGNOSIS — H8112 Benign paroxysmal vertigo, left ear: Secondary | ICD-10-CM

## 2024-06-11 DIAGNOSIS — Z23 Encounter for immunization: Secondary | ICD-10-CM | POA: Diagnosis not present

## 2024-06-11 NOTE — Addendum Note (Signed)
 Addended by: KATHRYNE MILLMAN B on: 06/11/2024 02:18 PM   Modules accepted: Orders

## 2024-06-11 NOTE — Progress Notes (Signed)
 Established Patient Office Visit     CC/Reason for Visit: Discuss acute concerns  HPI: Adrian [name] is a 63 y.o. male who is coming in today for the above mentioned reasons.   #1 twice while in bed and turning suddenly to the left he has experienced intense vertigo described as room spinning around him with nausea.  After laying still for a few minutes this will resolve.  This last episode has lasted a little bit longer.  2.  Will have at times cough with postnasal drip and upper airway wheezing.  3.  Requesting flu vaccine.   Past Medical/Surgical History: Past Medical History:  Diagnosis Date   Cancer [facility])    prostate   Dental crowns present    History of kidney stones    Plantar fasciitis of left and right  foot 05/2013    Past Surgical History:  Procedure Laterality Date   BREAST ENHANCEMENT SURGERY Left    asymmetry of chest wall   CLOSED REDUCTION METACARPAL WITH PERCUTANEOUS PINNING Right 12/22/2004   5th metacarpal   COLONOSCOPY  2012   CYSTOSCOPY WITH RETROGRADE PYELOGRAM, URETEROSCOPY AND STENT PLACEMENT Right 07/13/2020   Procedure: CYSTOSCOPY WITH RETROGRADE PYELOGRAM, RIGHT URETERAL STENT PLACEMENT;  Surgeon: Devere Lonni Righter, MD;  [facility];  Service: Urology;  Laterality: Right;   CYSTOSCOPY/URETEROSCOPY/HOLMIUM LASER/STENT PLACEMENT Right 08/13/2020   Procedure: CYSTOSCOPY/URETEROSCOPY/RETROGRADE/ REMOVAL OF STONE/STENT EXCHANGE;  Surgeon: Renda Glance, MD;  [facility];  Service: Urology;  Laterality: Right;   EXTRACORPOREAL SHOCK WAVE LITHOTRIPSY Right 07/09/2020   Procedure: EXTRACORPOREAL SHOCK WAVE LITHOTRIPSY (ESWL);  Surgeon: Renda Glance, MD;  Location: [facility];  Service: Urology;  Laterality: Right;   EXTRACORPOREAL SHOCK WAVE LITHOTRIPSY Left 07/28/2022   Procedure: LEFT EXTRACORPOREAL SHOCK WAVE LITHOTRIPSY (ESWL);  Surgeon: Nieves Cough, MD;  Location: [facility];  Service:  Urology;  Laterality: Left;   KNEE ARTHROSCOPY Left    LITHOTRIPSY     LYMPHADENECTOMY Bilateral 02/21/2019   Procedure: LYMPHADENECTOMY, PELVIC;  Surgeon: Renda Glance, MD;  [facility];  Service: Urology;  Laterality: Bilateral;   MANDIBLE SURGERY     PLANTAR FASCIA RELEASE  06/28/2012   Procedure: ENDOSCOPIC PLANTAR FASCIOTOMY;  Surgeon: Toribio JULIANNA Chancy, MD;  Location: [facility];  Service: Orthopedics;  Laterality: Right;   PLANTAR FASCIA RELEASE Left 06/13/2013   Procedure: ENDOSCOPIC PLANTAR FASCIOTOMY;  Surgeon: Toribio JULIANNA Chancy, MD;  Location: [facility];  Service: Orthopedics;  Laterality: Left;   ROBOT ASSISTED LAPAROSCOPIC RADICAL PROSTATECTOMY N/A 02/21/2019   Procedure: XI ROBOTIC ASSISTED LAPAROSCOPIC RADICAL PROSTATECTOMY LEVEL 2;  Surgeon: Renda Glance, MD;  [facility];  Service: Urology;  Laterality: N/A;   ROTATOR CUFF REPAIR Left 07/07/2023    Social History:  reports that he has never smoked. He has never used smokeless tobacco. He reports that he does not drink alcohol and does not use drugs.  Allergies: Allergies  Allergen Reactions   Oxycodone  Nausea And Vomiting   Hydrocodone  Nausea And Vomiting   Methocarbamol Nausea Only    Family History:  Family History  Problem Relation Age of Onset   Colon cancer Neg Hx    Colon polyps Neg Hx    Esophageal cancer Neg Hx    Rectal cancer Neg Hx    Stomach cancer Neg Hx      Current Outpatient Medications:    AMBULATORY NON FORMULARY MEDICATION, Medication Name: Nitroglycerin 0.125% Apply pea sized amount inside rectum  twice daily x 6 weeks, Disp: 30 g, Rfl: 0   amLODipine  (NORVASC ) 5 MG tablet, Take 1 tablet (5 mg total) by mouth daily., Disp: 90 tablet, Rfl: 1  Review of Systems:  Negative unless indicated in HPI.   Physical Exam: Vitals:   06/11/24 1324  BP: 110/84  Pulse: 83  Temp: 97.9 F (36.6 C)  TempSrc: Oral  SpO2: 98%  Weight: 209 lb 11.2 oz (95.1  kg)    Body mass index is 29.25 kg/m.    Impression and Plan:  BPPV (benign paroxysmal positional vertigo), left -     Referral to Neuro Rehab  Post-nasal drip  Chronic cough  Immunization due   - By description sounds like left-sided BPPV.  I will refer her to vestibular therapy. - Have advised daily antihistamine to see if this helps his chronic postnasal drip and cough. - Flu vaccine administered in office today.  Time spent:30 minutes reviewing chart, interviewing and examining patient and formulating plan of care.     Tully Theophilus Andrews, MD [facility]

## 2024-06-13 ENCOUNTER — Ambulatory Visit: Attending: Internal Medicine

## 2024-06-13 ENCOUNTER — Other Ambulatory Visit: Payer: Self-pay

## 2024-06-13 DIAGNOSIS — H8112 Benign paroxysmal vertigo, left ear: Secondary | ICD-10-CM | POA: Insufficient documentation

## 2024-06-13 DIAGNOSIS — R42 Dizziness and giddiness: Secondary | ICD-10-CM | POA: Diagnosis present
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
## 2024-06-15 ENCOUNTER — Encounter: Payer: Self-pay | Admitting: Internal Medicine

## 2024-06-15 DIAGNOSIS — H6121 Impacted cerumen, right ear: Secondary | ICD-10-CM

## 2024-06-17 MED ORDER — MECLIZINE HCL 12.5 MG PO TABS: 12.5000 mg | ORAL_TABLET | Freq: Three times a day (TID) | ORAL | 0 refills | Status: AC | PRN

## 2024-06-21 ENCOUNTER — Ambulatory Visit (INDEPENDENT_AMBULATORY_CARE_PROVIDER_SITE_OTHER): Admitting: Physician Assistant

## 2024-06-21 VITALS — BP 122/83 | HR 78 | Temp 97.7°F | Ht 71.0 in | Wt 207.0 lb

## 2024-06-21 DIAGNOSIS — H6121 Impacted cerumen, right ear: Secondary | ICD-10-CM | POA: Diagnosis not present

## 2024-06-21 DIAGNOSIS — R42 Dizziness and giddiness: Secondary | ICD-10-CM | POA: Diagnosis not present

## 2024-06-21 DIAGNOSIS — R0982 Postnasal drip: Secondary | ICD-10-CM

## 2024-06-21 NOTE — Progress Notes (Signed)
 Dear Dr. Theophilus Andrews, Here is my assessment for our mutual patient, Adrian [name]. Thank you for allowing me the opportunity to care for your patient. Please do not hesitate to contact me should you have any other questions. Sincerely, Chyrl Cohen PA-C  Otolaryngology Clinic Note Referring provider: Dr. Theophilus Andrews HPI:  Adrian [name] is a 63 y.o. male kindly referred by Dr. Theophilus Andrews   The patient is a 63 year old gentleman seen in our office for evaluation of cerumen impaction.  The patient notes that he has had issues with cerumen recently.  He went to his primary care provider, they were unable to remove the cerumen.  He was referred to our office.  Denies any changes to his hearing.  He does note bilateral tinnitus in both ears, he notes significant noise exposure as he worked around [employer] while in [place], he denies any pain or infectious signs or symptoms.  No history recurrent ear infections.  Additionally he notes a history of vertigo.  He notes this started approximately 2 months ago with an episode of room spinning dizziness while rolling over in bed.  He notes this lasted approximately day and a half and went away with complete resolution.  He has had 2 separate episodes since that time.  He did follow-up with his primary care provider and was referred to vestibular rehab.  He learned to do the Epley maneuver and did attempt this during episodes which seem to resolve one of the episodes.  He attempted it again on another episode this worsened his symptoms but eventually they resolved on its own.  He denies any associated neurologic symptoms with this, no headache, no increased tinnitus or decreased hearing.  No history of migraines, no recent infections.    He additionally notes that over the last several months he has had a slight wheeze when sleeping or resting.  He denies any difficulty breathing and no hoarseness, no throat pain.  He saw his primary care provider  who recommended daily antihistamine.  He denies any history of seasonal allergies or significant nasal congestion.   Independent Review of Additional Tests or Records:  none   PMH/Meds/All/SocHx/FamHx/ROS:   Past Medical History:  Diagnosis Date   Cancer [facility]    prostate   Dental crowns present    History of kidney stones    Plantar fasciitis of left and right  foot 05/2013     Past Surgical History:  Procedure Laterality Date   BREAST ENHANCEMENT SURGERY Left    asymmetry of chest wall   CLOSED REDUCTION METACARPAL WITH PERCUTANEOUS PINNING Right 12/22/2004   5th metacarpal   COLONOSCOPY  2012   CYSTOSCOPY WITH RETROGRADE PYELOGRAM, URETEROSCOPY AND STENT PLACEMENT Right 07/13/2020   Procedure: CYSTOSCOPY WITH RETROGRADE PYELOGRAM, RIGHT URETERAL STENT PLACEMENT;  Surgeon: Devere Lonni Righter, MD;  [facility];  Service: Urology;  Laterality: Right;   CYSTOSCOPY/URETEROSCOPY/HOLMIUM LASER/STENT PLACEMENT Right 08/13/2020   Procedure: CYSTOSCOPY/URETEROSCOPY/RETROGRADE/ REMOVAL OF STONE/STENT EXCHANGE;  Surgeon: Renda Glance, MD;  [facility];  Service: Urology;  Laterality: Right;   EXTRACORPOREAL SHOCK WAVE LITHOTRIPSY Right 07/09/2020   Procedure: EXTRACORPOREAL SHOCK WAVE LITHOTRIPSY (ESWL);  Surgeon: Renda Glance, MD;  Location[facility];  Service: Urology;  Laterality: Right;   EXTRACORPOREAL SHOCK WAVE LITHOTRIPSY Left 07/28/2022   Procedure: LEFT EXTRACORPOREAL SHOCK WAVE LITHOTRIPSY (ESWL);  Surgeon: Nieves Cough, MD;  Location: [facility];  Service: Urology;  Laterality: Left;   KNEE ARTHROSCOPY Left    LITHOTRIPSY  LYMPHADENECTOMY Bilateral 02/21/2019   Procedure: LYMPHADENECTOMY, PELVIC;  Surgeon: Renda Glance, MD;  [facility];  Service: Urology;  Laterality: Bilateral;   MANDIBLE SURGERY     PLANTAR FASCIA RELEASE  06/28/2012   Procedure: ENDOSCOPIC PLANTAR FASCIOTOMY;  Surgeon: Toribio JULIANNA Chancy,  MD;  Location: [facility];  Service: Orthopedics;  Laterality: Right;   PLANTAR FASCIA RELEASE Left 06/13/2013   Procedure: ENDOSCOPIC PLANTAR FASCIOTOMY;  Surgeon: Toribio JULIANNA Chancy, MD;  Location: [facility];  Service: Orthopedics;  Laterality: Left;   ROBOT ASSISTED LAPAROSCOPIC RADICAL PROSTATECTOMY N/A 02/21/2019   Procedure: XI ROBOTIC ASSISTED LAPAROSCOPIC RADICAL PROSTATECTOMY LEVEL 2;  Surgeon: Renda Glance, MD;  [facility];  Service: Urology;  Laterality: N/A;   ROTATOR CUFF REPAIR Left 07/07/2023    Family History  Problem Relation Age of Onset   Colon cancer Neg Hx    Colon polyps Neg Hx    Esophageal cancer Neg Hx    Rectal cancer Neg Hx    Stomach cancer Neg Hx      Social Connections: Unknown (02/18/2019)   Social Connection and Isolation Panel    Frequency of Communication with Friends and Family: Not asked    Frequency of Social Gatherings with Friends and Family: Not asked    Attends Religious Services: Not asked    Active Member of Clubs or Organizations: Not asked    Attends Banker Meetings: Not asked    Marital Status: Not asked      Current Outpatient Medications:    amLODipine  (NORVASC ) 5 MG tablet, Take 1 tablet (5 mg total) by mouth daily., Disp: 90 tablet, Rfl: 1   meclizine (ANTIVERT) 12.5 MG tablet, Take 1 tablet (12.5 mg total) by mouth every 8 (eight) hours as needed for dizziness., Disp: 30 tablet, Rfl: 0   ondansetron  (ZOFRAN -ODT) 4 MG disintegrating tablet, Take 4 mg by mouth every 6 (six) hours as needed., Disp: , Rfl:    Physical Exam:   BP 122/83 (BP Location: Right Arm, Patient Position: Sitting, Cuff Size: Normal)   Pulse 78   Temp 97.7 F (36.5 C) (Oral)   Ht 5' 11 (1.803 m)   Wt 207 lb (93.9 kg)   SpO2 98%   BMI 28.87 kg/m   Pertinent Findings  CN II-XII intact Right cerumen faction left EAC clear with intact TM with well-pneumatized middle ear space Anterior rhinoscopy: Septum  right deviation; bilateral inferior turbinates with mild hypertrophy No lesions of oral cavity/oropharynx; dentition within normal limits No obviously palpable neck masses/lymphadenopathy/thyromegaly No respiratory distress or stridor    Seprately Identifiable Procedures:  Procedure: bilateral ear microscopy and cerumen removal using microscope (CPT 30789) - Mod 25 Pre-procedure diagnosis: unilateral cerumen impaction right external auditory canal Post-procedure diagnosis: same Indication: bilateral cerumen impaction; given patient's otologic complaints and history as well as for improved and comprehensive examination of external ear and tympanic membrane, bilateral otologic examination using microscope was performed and impacted cerumen removed  Procedure: Patient was placed semi-recumbent. Both ear canals were examined using the microscope with findings above. Cerumen removed from the right external auditory canal using suction and currette with improvement in EAC examination and patency. Left: EAC was patent. TM was intact . Middle ear was aerated. Drainage: none Right: EAC was patent. TM was intact . Middle ear was aerated . Drainage: none Patient tolerated the procedure well.   Impression & Plans:  Christia Coaxum is a 63 y.o. male with the following   Cerumen  impaction-  Move without difficulty  Vertigo-  Episodic vertigo consistent with BPPV, would recommend continued vestibular therapy low suspicion for any other etiology at this time.  Postnasal drainage-  Patient notes some intermittent wheezing, uncertain if this is pulmonary versus vocal cord dysfunction.  I would recommend continuing antihistamine with saline irrigation and Flonase.  I like to see him back in the office if his symptoms persist in the next several months for nasal endoscopy.  He verbalized understanding and agreement to today's plan had no further questions or concerns.   - f/u 2 to 3 months   Thank you  for allowing me the opportunity to care for your patient. Please do not hesitate to contact me should you have any other questions.  Sincerely, Chyrl Cohen PA-C [facility] Phone: [phone] Fax: [phone]  06/21/2024, 11:56 AM

## 2024-06-28 ENCOUNTER — Ambulatory Visit

## 2024-07-02 ENCOUNTER — Ambulatory Visit

## 2024-07-02 DIAGNOSIS — H8112 Benign paroxysmal vertigo, left ear: Secondary | ICD-10-CM

## 2024-07-02 DIAGNOSIS — R42 Dizziness and giddiness: Secondary | ICD-10-CM | POA: Diagnosis not present
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
## 2024-08-27 ENCOUNTER — Ambulatory Visit: Admitting: Internal Medicine

## 2024-08-27 VITALS — BP 130/84 | HR 73 | Temp 97.9°F | Ht 72.0 in | Wt 211.3 lb

## 2024-08-27 DIAGNOSIS — I10 Essential (primary) hypertension: Secondary | ICD-10-CM | POA: Diagnosis not present

## 2024-08-27 DIAGNOSIS — E559 Vitamin D deficiency, unspecified: Secondary | ICD-10-CM | POA: Diagnosis not present

## 2024-08-27 DIAGNOSIS — Z0000 Encounter for general adult medical examination without abnormal findings: Secondary | ICD-10-CM | POA: Diagnosis not present

## 2024-08-27 DIAGNOSIS — C61 Malignant neoplasm of prostate: Secondary | ICD-10-CM

## 2024-08-27 DIAGNOSIS — R972 Elevated prostate specific antigen [PSA]: Secondary | ICD-10-CM

## 2024-08-27 LAB — COMPREHENSIVE METABOLIC PANEL WITH GFR
ALT: 19 U/L (ref 3–53)
AST: 18 U/L (ref 5–37)
Albumin: 4.3 g/dL (ref 3.5–5.2)
Alkaline Phosphatase: 81 U/L (ref 39–117)
BUN: 16 mg/dL (ref 6–23)
CO2: 26 meq/L (ref 19–32)
Calcium: 9.3 mg/dL (ref 8.4–10.5)
Chloride: 106 meq/L (ref 96–112)
Creatinine, Ser: 1.04 mg/dL (ref 0.40–1.50)
GFR: 75.63 mL/min (ref >60.00)
Glucose, Bld: 90 mg/dL (ref 70–99)
Potassium: 3.9 meq/L (ref 3.5–5.1)
Sodium: 139 meq/L (ref 135–145)
Total Bilirubin: 0.4 mg/dL (ref 0.2–1.2)
Total Protein: 6.8 g/dL (ref 6.0–8.3)

## 2024-08-27 LAB — CBC WITH DIFFERENTIAL/PLATELET
Basophils Absolute: 0.1 K/uL (ref 0.0–0.1)
Basophils Relative: 1.2 % (ref 0.0–3.0)
Eosinophils Absolute: 0.3 K/uL (ref 0.0–0.7)
Eosinophils Relative: 4.5 % (ref 0.0–5.0)
HCT: 44.6 % (ref 39.0–52.0)
Hemoglobin: 15.4 g/dL (ref 13.0–17.0)
Lymphocytes Relative: 33.8 % (ref 12.0–46.0)
Lymphs Abs: 2.4 K/uL (ref 0.7–4.0)
MCHC: 34.6 g/dL (ref 30.0–36.0)
MCV: 85.2 fl (ref 78.0–100.0)
Monocytes Absolute: 0.8 K/uL (ref 0.1–1.0)
Monocytes Relative: 10.9 % (ref 3.0–12.0)
Neutro Abs: 3.5 K/uL (ref 1.4–7.7)
Neutrophils Relative %: 49.6 % (ref 43.0–77.0)
Platelets: 268 K/uL (ref 150.0–400.0)
RBC: 5.24 Mil/uL (ref 4.22–5.81)
RDW: 13.5 % (ref 11.5–15.5)
WBC: 7.1 K/uL (ref 4.0–10.5)

## 2024-08-27 LAB — LIPID PANEL
Cholesterol: 159 mg/dL (ref 28–200)
HDL: 50.8 mg/dL (ref >39.00)
LDL Cholesterol: 87 mg/dL (ref 10–99)
NonHDL: 108.1
Total CHOL/HDL Ratio: 3
Triglycerides: 107 mg/dL (ref 10.0–149.0)
VLDL: 21.4 mg/dL (ref 0.0–40.0)

## 2024-08-27 LAB — PSA: PSA: 0 ng/mL — ABNORMAL LOW (ref 0.10–4.00)

## 2024-08-27 LAB — VITAMIN D 25 HYDROXY (VIT D DEFICIENCY, FRACTURES): VITD: 32.11 ng/mL (ref 30.00–100.00)

## 2024-08-27 NOTE — Progress Notes (Signed)
 Established Patient Office Visit     CC/Reason for Visit: Welcome to Medicare  HPI: Adrian [name] is a 63 y.o. male who is coming in today for the above mentioned reasons. Past Medical History is significant for: Hypertension, history of prostate cancer and vitamin D  deficiency.  Is due for pneumonia vaccines, cancer screening is up-to-date.   Past Medical/Surgical History: Past Medical History:  Diagnosis Date   Cancer [facility])    prostate   Dental crowns present    History of kidney stones    Plantar fasciitis of left and right  foot 05/2013    Past Surgical History:  Procedure Laterality Date   BREAST ENHANCEMENT SURGERY Left    asymmetry of chest wall   CLOSED REDUCTION METACARPAL WITH PERCUTANEOUS PINNING Right 12/22/2004   5th metacarpal   COLONOSCOPY  2012   CYSTOSCOPY WITH RETROGRADE PYELOGRAM, URETEROSCOPY AND STENT PLACEMENT Right 07/13/2020   Procedure: CYSTOSCOPY WITH RETROGRADE PYELOGRAM, RIGHT URETERAL STENT PLACEMENT;  Surgeon: Devere Lonni Righter, MD;  [facility];  Service: Urology;  Laterality: Right;   CYSTOSCOPY/URETEROSCOPY/HOLMIUM LASER/STENT PLACEMENT Right 08/13/2020   Procedure: CYSTOSCOPY/URETEROSCOPY/RETROGRADE/ REMOVAL OF STONE/STENT EXCHANGE;  Surgeon: Renda Glance, MD;  [facility];  Service: Urology;  Laterality: Right;   EXTRACORPOREAL SHOCK WAVE LITHOTRIPSY Right 07/09/2020   Procedure: EXTRACORPOREAL SHOCK WAVE LITHOTRIPSY (ESWL);  Surgeon: Renda Glance, MD;  Location: [facility];  Service: Urology;  Laterality: Right;   EXTRACORPOREAL SHOCK WAVE LITHOTRIPSY Left 07/28/2022   Procedure: LEFT EXTRACORPOREAL SHOCK WAVE LITHOTRIPSY (ESWL);  Surgeon: Nieves Cough, MD;  Location: [facility];  Service: Urology;  Laterality: Left;   KNEE ARTHROSCOPY Left    LITHOTRIPSY     LYMPHADENECTOMY Bilateral 02/21/2019   Procedure: LYMPHADENECTOMY, PELVIC;  Surgeon: Renda Glance, MD;  [facility];  Service: Urology;  Laterality: Bilateral;   MANDIBLE SURGERY     PLANTAR FASCIA RELEASE  06/28/2012   Procedure: ENDOSCOPIC PLANTAR FASCIOTOMY;  Surgeon: Toribio JULIANNA Chancy, MD;  Location: [facility];  Service: Orthopedics;  Laterality: Right;   PLANTAR FASCIA RELEASE Left 06/13/2013   Procedure: ENDOSCOPIC PLANTAR FASCIOTOMY;  Surgeon: Toribio JULIANNA Chancy, MD;  Location: [facility];  Service: Orthopedics;  Laterality: Left;   ROBOT ASSISTED LAPAROSCOPIC RADICAL PROSTATECTOMY N/A 02/21/2019   Procedure: XI ROBOTIC ASSISTED LAPAROSCOPIC RADICAL PROSTATECTOMY LEVEL 2;  Surgeon: Renda Glance, MD;  [facility];  Service: Urology;  Laterality: N/A;   ROTATOR CUFF REPAIR Left 07/07/2023    Social History:  reports that he has never smoked. He has never used smokeless tobacco. He reports that he does not drink alcohol and does not use drugs.  Allergies: Allergies[1]  Family History:  Family History  Problem Relation Age of Onset   Colon cancer Neg Hx    Colon polyps Neg Hx    Esophageal cancer Neg Hx    Rectal cancer Neg Hx    Stomach cancer Neg Hx     Current Medications[2]  Review of Systems:  Negative unless indicated in HPI.   Physical Exam: Vitals:   08/27/24 1354  BP: 130/84  Pulse: 73  Temp: 97.9 F (36.6 C)  TempSrc: Oral  SpO2: 98%  Weight: 211 lb 4.8 oz (95.8 kg)  Height: 6' (1.829 m)    Body mass index is 28.66 kg/m.  Welcome to [employer] wellness visit   Visit info / Clinical Intake: Medicare Wellness Visit Type:: Welcome to [employer] (IPPE) Persons participating in visit  and providing information:: patient Medicare Wellness Visit Mode:: In-person (required for WTM) Interpreter Needed?: No Pre-visit prep was completed: yes AWV questionnaire completed by patient prior to visit?: no Living arrangements:: lives with spouse/significant other Patient's Overall Health Status Rating: good Typical amount of pain: none Does  pain affect daily life?: no Are you currently prescribed opioids?: no  Dietary Habits and Nutritional Risks How many meals a day?: 3 Eats fruit and vegetables daily?: (!) no Most meals are obtained by: eating out; preparing own meals In the last 2 weeks, have you had any of the following?: (!) nausea, vomiting, diarrhea (diarrhea) Diabetic:: no  Functional Status Activities of Daily Living (to include ambulation/medication): Independent Ambulation: Independent Medication Administration: Independent Home Management (perform basic housework or laundry): Independent Manage your own finances?: yes Primary transportation is: driving Concerns about vision?: no *vision screening is required for WTM* Concerns about hearing?: no  Fall Screening Falls in the past year?: 0 Number of falls in past year: 0 Was there an injury with Fall?: 0 Fall Risk Category Calculator: 0 Patient Fall Risk Level: Low Fall Risk  Fall Risk Patient at Risk for Falls Due to: No Fall Risks Fall risk Follow up: Falls evaluation completed  Home and Transportation Safety: All rugs have non-skid backing?: (!) no All stairs or steps have railings?: yes Grab bars in the bathtub or shower?: (!) no Have non-skid surface in bathtub or shower?: yes Good home lighting?: yes Regular seat belt use?: yes Hospital stays in the last year:: no  Cognitive Assessment Difficulty concentrating, remembering, or making decisions? : no Will 6CIT or Mini Cog be Completed: yes What year is it?: 0 points What month is it?: 0 points Give patient an address phrase to remember (5 components): [place] went [place] About what time is it?: 0 points Count backwards from 20 to 1: 0 points Say the months of the year in reverse: 0 points Repeat the address phrase from earlier: 0 points 6 CIT Score: 0 points  Advance Directives (For Healthcare) Does Patient Have a Medical Advance Directive?: No Does patient want to make  changes to medical advance directive?: No - Patient declined Would patient like information on creating a medical advance directive?: No - Patient declined  Reviewed/Updated  Reviewed/Updated: Reviewed All (Medical, Surgical, Family, Medications, Allergies, Care Teams, Patient Goals)    Vision Screening   Right eye Left eye Both eyes  Without correction 20/20 20/20 20/20   With correction         Depression/mood:  Flowsheet Row Office Visit from 11/28/2023 in [facility]  PHQ-9 Total Score 0        Counseling: Counseling given: Not Answered     Lab orders based on risk factors: Laboratory update will be reviewed     Screening: Patient provided with a written and personalized 5-10 year screening schedule in the AVS. Health Maintenance  Topic Date Due   Pneumococcal Vaccine for age over 27 (1 of 1 - PCV) Never done   COVID-19 Vaccine (3 - Pfizer risk series) 02/11/2020   DTaP/Tdap/Td vaccine (3 - Tdap) 06/13/2028   Colon Cancer Screening  09/16/2028   Flu Shot  Completed   Hepatitis C Screening  Completed   HIV Screening  Completed   Zoster (Shingles) Vaccine  Completed   Hepatitis B Vaccine  Aged Out   HPV Vaccine  Aged Out   Meningitis B Vaccine  Aged Out     Provider List  Update: Patient Care Team    Relationship Specialty Notifications Start End  Theophilus Andrews, Tully GRADE, MD PCP - General Internal Medicine  05/07/23        I have personally reviewed and noted the following in the patients chart:   Medical and social history Use of alcohol, tobacco or illicit drugs  Current medications and supplements Functional ability and status Nutritional status Physical activity Advanced directives List of other physicians Hospitalizations, surgeries, and ER visits in previous 12 months Vitals Screenings to include cognitive, depression, and falls Referrals and appointments  In addition, I have reviewed and discussed with patient  certain preventive protocols, quality metrics, and best practice recommendations. A written personalized care plan for preventive services as well as general preventive health recommendations were provided to patient.   Impression and Plan:  Medicare welcome exam  Primary hypertension -     CBC with Differential/Platelet; Future -     Comprehensive metabolic panel with GFR; Future -     Lipid panel; Future  Prostate cancer (HCC) -     PSA; Future  Vitamin D  deficiency -     VITAMIN D  25 Hydroxy (Vit-D Deficiency, Fractures); Future   -Recommend routine eye and dental care. -Healthy lifestyle discussed in detail. -Labs to be updated today. -Prostate cancer screening: Follow-up PSA today Health Maintenance  Topic Date Due   Pneumococcal Vaccine for age over 5 (1 of 1 - PCV) Never done   COVID-19 Vaccine (3 - Pfizer risk series) 02/11/2020   DTaP/Tdap/Td vaccine (3 - Tdap) 06/13/2028   Colon Cancer Screening  09/16/2028   Flu Shot  Completed   Hepatitis C Screening  Completed   HIV Screening  Completed   Zoster (Shingles) Vaccine  Completed   Hepatitis B Vaccine  Aged Out   HPV Vaccine  Aged Out   Meningitis B Vaccine  Aged Out     -Declines pneumonia vaccine today but will consider at a future date.    Tully Theophilus Andrews, MD [facility]     [1]  Allergies Allergen Reactions   Oxycodone  Nausea And Vomiting   Hydrocodone  Nausea And Vomiting   Methocarbamol Nausea Only  [2]  Current Outpatient Medications:    amLODipine  (NORVASC ) 5 MG tablet, Take 1 tablet (5 mg total) by mouth daily., Disp: 90 tablet, Rfl: 1   meclizine  (ANTIVERT ) 12.5 MG tablet, Take 1 tablet (12.5 mg total) by mouth every 8 (eight) hours as needed for dizziness., Disp: 30 tablet, Rfl: 0   ondansetron  (ZOFRAN -ODT) 4 MG disintegrating tablet, Take 4 mg by mouth every 6 (six) hours as needed., Disp: , Rfl:

## 2024-08-28 ENCOUNTER — Ambulatory Visit: Payer: Self-pay | Admitting: Internal Medicine

## 2024-10-31 ENCOUNTER — Ambulatory Visit: Admitting: Dermatology
